# Patient Record
Sex: Male | Born: 1990 | Race: White | Hispanic: No | Marital: Single | Smoking: Current every day smoker
Health system: Southern US, Community
[De-identification: ages and names within clinical notes are randomized; demographics above are authoritative.]

## PROBLEM LIST (undated history)

## (undated) DIAGNOSIS — F319 Bipolar disorder, unspecified: Secondary | ICD-10-CM

## (undated) DIAGNOSIS — Z22322 Carrier or suspected carrier of Methicillin resistant Staphylococcus aureus: Secondary | ICD-10-CM

## (undated) DIAGNOSIS — F1920 Other psychoactive substance dependence, uncomplicated: Secondary | ICD-10-CM

## (undated) DIAGNOSIS — J45909 Unspecified asthma, uncomplicated: Secondary | ICD-10-CM

---
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
## 2012-10-14 ENCOUNTER — Emergency Department (HOSPITAL_COMMUNITY)
Admission: EM | Admit: 2012-10-14 | Discharge: 2012-10-14 | Disposition: A | Discharge status: Home / Self Care | Payer: Self-pay | Attending: Emergency Medicine | Admitting: Emergency Medicine

## 2012-10-14 ENCOUNTER — Encounter (HOSPITAL_COMMUNITY): Payer: Self-pay | Admitting: Emergency Medicine

## 2012-10-14 DIAGNOSIS — J45901 Unspecified asthma with (acute) exacerbation: Secondary | ICD-10-CM | POA: Insufficient documentation

## 2012-10-14 DIAGNOSIS — K047 Periapical abscess without sinus: Secondary | ICD-10-CM | POA: Insufficient documentation

## 2012-10-14 HISTORY — DX: Unspecified asthma, uncomplicated: J45.909

## 2012-10-14 MED ORDER — ACETAMINOPHEN 325 MG PO TABS
650.0000 mg | ORAL_TABLET | Freq: Once | ORAL | Status: AC
  Administered 2012-10-14: 650 mg via ORAL
  Filled 2012-10-14: qty 2

## 2012-10-14 MED ORDER — ALBUTEROL SULFATE HFA 108 (90 BASE) MCG/ACT IN AERS: 2.0000 | INHALATION_SPRAY | RESPIRATORY_TRACT | Status: DC | PRN

## 2012-10-14 MED ORDER — ALBUTEROL SULFATE (5 MG/ML) 0.5% IN NEBU
INHALATION_SOLUTION | RESPIRATORY_TRACT | Status: AC
  Filled 2012-10-14: qty 0.5

## 2012-10-14 MED ORDER — PREDNISONE 20 MG PO TABS
40.0000 mg | ORAL_TABLET | Freq: Once | ORAL | Status: AC
  Administered 2012-10-14: 40 mg via ORAL
  Filled 2012-10-14: qty 2

## 2012-10-14 MED ORDER — ALBUTEROL SULFATE (5 MG/ML) 0.5% IN NEBU
5.0000 mg | INHALATION_SOLUTION | Freq: Once | RESPIRATORY_TRACT | Status: AC
  Administered 2012-10-14: 5 mg via RESPIRATORY_TRACT
  Filled 2012-10-14: qty 1

## 2012-10-14 MED ORDER — PREDNISONE 20 MG PO TABS: ORAL_TABLET | ORAL | Status: DC

## 2012-10-14 MED ORDER — PENICILLIN V POTASSIUM 500 MG PO TABS: 500.0000 mg | ORAL_TABLET | Freq: Three times a day (TID) | ORAL | Status: DC

## 2012-10-14 MED ORDER — ALBUTEROL SULFATE (5 MG/ML) 0.5% IN NEBU
2.5000 mg | INHALATION_SOLUTION | Freq: Once | RESPIRATORY_TRACT | Status: AC
  Administered 2012-10-14: 2.5 mg via RESPIRATORY_TRACT
  Filled 2012-10-14: qty 0.5

## 2012-10-14 NOTE — ED Notes (Signed)
Patient with c/o SOB and asthma exacerbation Patient states that he recently lost his Albuterol inhaler at work Patient able to speak in full, complete sentences without difficulty--no SOB, DOE noted during assessment Faint exp. Wheezing noted bilaterally Patient in NAD Neb tx given, see MAR Call bell in reach

## 2012-10-14 NOTE — ED Provider Notes (Signed)
CSN: [id number]     Arrival date & time 10/14/12  0137 History   First MD Initiated Contact with Patient 10/14/12 0154     Chief Complaint  Patient presents with  . Asthma   (Consider location/radiation/quality/duration/timing/severity/associated sxs/prior Treatment) HPI Comments: 22 yo male with asthma hx, no pcp, non smoker, asthma hx presents with gradual worsening breathing difficulty for three days, similar to previous asthma. Pt out of albuterol.  No current abx or prednisone.  Pt also has left lower tooth abscess, no fevers, pain with palpation of gingiva.   Patient is a 22 y.o. male presenting with asthma. The history is provided by the patient.  Asthma Associated symptoms include shortness of breath. Pertinent negatives include no chest pain and no abdominal pain.    Past Medical History  Diagnosis Date  . Asthma    History reviewed. No pertinent past surgical history. No family history on file. History  Substance Use Topics  . Smoking status: Never Smoker   . Smokeless tobacco: Not on file  . Alcohol Use: No    Review of Systems  Constitutional: Negative for fever and chills.  HENT: Positive for dental problem. Negative for neck pain and neck stiffness.   Respiratory: Positive for shortness of breath.   Cardiovascular: Negative for chest pain.  Gastrointestinal: Negative for vomiting and abdominal pain.  Genitourinary: Negative for dysuria and flank pain.  Musculoskeletal: Negative for back pain.  Skin: Negative for rash.    Allergies  Review of patient's allergies indicates no known allergies.  Home Medications   Current Outpatient Rx  Name  Route  Sig  Dispense  Refill  . albuterol (PROVENTIL HFA;VENTOLIN HFA) 108 (90 BASE) MCG/ACT inhaler   Inhalation   Inhale 2 puffs into the lungs every 6 (six) hours as needed for wheezing.          BP 145/74  Pulse 72  Temp(Src) 97.7 F (36.5 C) (Oral)  Resp 20  Ht 6\' 2"  (1.88 m)  Wt 285 lb (129.275 kg)  BMI  36.58 kg/m2  SpO2 97% Physical Exam  Nursing note and vitals reviewed. Constitutional: He is oriented to person, place, and time. He appears well-developed and well-nourished.  HENT:  Head: Normocephalic and atraumatic.  Marland Kitchen5 cm lateral gingival abscess left lower posterior molar No trismus, uvular deviation, unilateral posterior pharyngeal edema or submandibular swelling   Eyes: Conjunctivae are normal. Right eye exhibits no discharge. Left eye exhibits no discharge.  Neck: Normal range of motion. Neck supple. No tracheal deviation present.  Cardiovascular: Normal rate and regular rhythm.   Pulmonary/Chest: He has wheezes (expiratory ).  Abdominal: Soft. He exhibits no distension. There is no tenderness. There is no guarding.  Musculoskeletal: He exhibits no edema.  Neurological: He is alert and oriented to person, place, and time.  Skin: Skin is warm. No rash noted.  Psychiatric: He has a normal mood and affect.    ED Course  Procedures (including critical care time) Labs Review Labs Reviewed - No data to display Imaging Review No results found.  MDM  No diagnosis found.  INCISION AND DRAINAGE Performed by: Enid Skeens Consent: Verbal consent obtained. Risks and benefits: risks, benefits and alternatives were discussed Type: abscess  Body area: left lower gingiva Anesthesia: local infiltration Incision was made with a scalpel. Local anesthetic: lidocaine  Complexity: simple Blunt dissection to break up loculations Drainage: blood/ pus 2 cc  Patient tolerance: Patient tolerated the procedure well with no immediate complications.  Pt received  nebs in ED, improved significantly on recheck, good air movement, not requiring O2. Steroids in ED.    Asthma exacerbation, Dental abscess  Enid Skeens, MD 10/14/12 [phone]

## 2012-10-14 NOTE — ED Notes (Signed)
MD at bedside. 

## 2012-10-14 NOTE — ED Notes (Signed)
Pt c/o shob, pt does not have MDI, denies fever.

## 2012-11-04 ENCOUNTER — Emergency Department (HOSPITAL_COMMUNITY)
Admission: EM | Admit: 2012-11-04 | Discharge: 2012-11-04 | Disposition: A | Discharge status: Home / Self Care | Payer: Self-pay | Attending: Emergency Medicine | Admitting: Emergency Medicine

## 2012-11-04 ENCOUNTER — Encounter (HOSPITAL_COMMUNITY): Payer: Self-pay | Admitting: Emergency Medicine

## 2012-11-04 DIAGNOSIS — R002 Palpitations: Secondary | ICD-10-CM | POA: Insufficient documentation

## 2012-11-04 DIAGNOSIS — J45901 Unspecified asthma with (acute) exacerbation: Secondary | ICD-10-CM | POA: Insufficient documentation

## 2012-11-04 DIAGNOSIS — Z792 Long term (current) use of antibiotics: Secondary | ICD-10-CM | POA: Insufficient documentation

## 2012-11-04 DIAGNOSIS — J4521 Mild intermittent asthma with (acute) exacerbation: Secondary | ICD-10-CM

## 2012-11-04 MED ORDER — ALBUTEROL SULFATE HFA 108 (90 BASE) MCG/ACT IN AERS
2.0000 | INHALATION_SPRAY | RESPIRATORY_TRACT | Status: DC | PRN
  Filled 2012-11-04: qty 6.7

## 2012-11-04 MED ORDER — ALBUTEROL SULFATE (5 MG/ML) 0.5% IN NEBU
5.0000 mg | INHALATION_SOLUTION | Freq: Once | RESPIRATORY_TRACT | Status: AC
  Administered 2012-11-04: 5 mg via RESPIRATORY_TRACT
  Filled 2012-11-04: qty 1

## 2012-11-04 NOTE — ED Notes (Signed)
Denies shortness of breath on discharge.

## 2012-11-04 NOTE — ED Provider Notes (Signed)
CSN: [id number]     Arrival date & time 11/04/12  2120 History   First MD Initiated Contact with Patient 11/04/12 2233     Chief Complaint  Patient presents with  . Shortness of Breath   (Consider location/radiation/quality/duration/timing/severity/associated sxs/prior Treatment) HPI Comments: No PCP has had increasing episodes of wheezing over the past several weeks with no fever or URI symptoms Out of inhaler   Patient is a 22 y.o. male presenting with shortness of breath. The history is provided by the patient.  Shortness of Breath Severity:  Moderate Onset quality:  Gradual Duration:  2 weeks Timing:  Intermittent Chronicity:  Recurrent Relieved by:  Inhaler (out of inhaler for the past 3 weeks ) Associated symptoms: cough and wheezing   Associated symptoms: no chest pain and no fever     Past Medical History  Diagnosis Date  . Asthma    History reviewed. No pertinent past surgical history. No family history on file. History  Substance Use Topics  . Smoking status: Never Smoker   . Smokeless tobacco: Not on file  . Alcohol Use: No    Review of Systems  Constitutional: Negative for fever.  HENT: Negative for rhinorrhea.   Respiratory: Positive for cough, shortness of breath and wheezing.   Cardiovascular: Positive for palpitations. Negative for chest pain.  All other systems reviewed and are negative.    Allergies  Review of patient's allergies indicates no known allergies.  Home Medications   Current Outpatient Rx  Name  Route  Sig  Dispense  Refill  . albuterol (PROVENTIL HFA;VENTOLIN HFA) 108 (90 BASE) MCG/ACT inhaler   Inhalation   Inhale 2 puffs into the lungs every 6 (six) hours as needed for wheezing.         . penicillin v potassium (VEETID) 500 MG tablet   Oral   Take 1 tablet (500 mg total) by mouth 3 (three) times daily.   21 tablet   0    BP 127/71  Pulse 72  Temp(Src) 97.9 F (36.6 C) (Oral)  Resp 18  Wt 290 lb 3.2 oz (131.634 kg)   BMI 37.24 kg/m2  SpO2 97% Physical Exam  Nursing note and vitals reviewed. Constitutional: He appears well-developed and well-nourished.  HENT:  Head: Normocephalic.  Neck: Normal range of motion.  Cardiovascular: Regular rhythm.   Pulmonary/Chest: Effort normal. He has wheezes.  Slight expiratory wheeze on R   Seen after neb treatment   Musculoskeletal: Normal range of motion.  Neurological: He is alert.    ED Course  Procedures (including critical care time) Labs Review Labs Reviewed - No data to display Imaging Review No results found.  EKG Interpretation   None       MDM   1. Asthma exacerbation attacks, mild intermittent     Will give inhaler and resource list     Arman Filter, NP 11/04/12 2248

## 2012-11-04 NOTE — ED Notes (Signed)
Patient with increasing shortness of breath since this morning.  Patient states that he caught a cold and has made his cough and breathing worse.

## 2012-11-04 NOTE — ED Provider Notes (Signed)
Medical screening examination/treatment/procedure(s) were performed by non-physician practitioner and as supervising physician I was immediately available for consultation/collaboration.  EKG Interpretation   None         Gwyneth Sprout, MD 11/04/12 2351

## 2013-07-11 ENCOUNTER — Ambulatory Visit (HOSPITAL_COMMUNITY)
Admission: RE | Admit: 2013-07-11 | Discharge: 2013-07-11 | Disposition: A | Discharge status: Home / Self Care | Payer: Self-pay | Attending: Psychiatry | Admitting: Psychiatry

## 2013-07-11 NOTE — BH Assessment (Signed)
Assessment Note  Logan Hancock is an 23 y.[place]. male presenting as walk in and accompanied by his mother. 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 He reports first use at age 23. He uses IV nearly daily. Longest period of abstinence has been 1 month. He reports cutting down usage recently by taking Suboxone 1/2 a strip from a friend.  He reports no cocaine/crack usage. No use of pain pills. Reports rare alcohol usage. Marijuana use 1 joint nearly daily and began usage at age 23.  Last use of heroin yesterday, injects into hands, visible track marks. 1/2 gram usage yesterday. Reports withdrawal symptoms of nausea, irritability, diarrhea in past. No history of substance abuse or mental health inpatient or outpatient treatment. Reports no depression or symptoms of anxiety. Denied SI/HI and AH/VH.  Trouble sleeping and poor appetite recently. Currently homeless and staying with friends after mother kicked him out for stealing. Currently unemployed and has 9th grade education. Reports no current or past legal problems. Reports losing his housing is his motivation to change. He declines [facility] to [facility] or [facility] for inpatient [facility] detox. [facility] as we do not provide heroin detox per Brett Albinoori, NP. Provided outpatient methadone resources, outpatient substance abuse treatment, and therapeutic substance abuse community resources. Encouraged consideration of therapeutic community which could provide housing, treatment, and educational resources.   Axis I: Substance Abuse (Cannabis, Heroin) Axis II: Deferred Axis III:  Past Medical History  Diagnosis Date  . Asthma    Axis IV: economic problems, educational problems, housing problems and occupational problems Axis V: 51-60 moderate symptoms  Past Medical History:  Past Medical History  Diagnosis Date  . Asthma     No past surgical history on file.  Family History: No family history on file.  Social History:  reports that he has never smoked. He  does not have any smokeless tobacco history on file. He reports that he uses illicit drugs (Marijuana). He reports that he does not drink alcohol.  Additional Social History:     CIWA:   COWS:    Allergies: No Known Allergies  Home Medications:  (Not in a hospital admission)  OB/GYN Status:  No LMP for male patient.  General Assessment Data Location of Assessment: [facility] Services Is this a Tele or Face-to-Face Assessment?: Face-to-Face Is this an Initial Assessment or a Re-assessment for this encounter?: Initial Assessment Living Arrangements: Non-relatives/Friends Can pt return to current living arrangement?: Yes Admission Status: Voluntary Is patient capable of signing voluntary admission?: Yes Transfer from: Home Referral Source: Self/Family/Friend  Medical Screening Exam [facility] Walk-in ONLY) Medical Exam completed: No Reason for MSE not completed:  (declined)  [facility] Crisis Care Plan Living Arrangements: Non-relatives/Friends  Education Status Is patient currently in school?: No Highest grade of school patient has completed: 9th Name of school: [employer] Contact person: mother  Risk to self Suicidal Ideation: No Suicidal Intent: No Is patient at risk for suicide?: No Suicidal Plan?: No Access to Means: No What has been your use of drugs/alcohol within the last 12 months?:  (see above) Previous Attempts/Gestures: No Other Self Harm Risks: none Triggers for Past Attempts: None known Intentional Self Injurious Behavior: None Family Suicide History: No Recent stressful life event(s): Job Loss;Financial Problems Depression: No Depression Symptoms: Feeling angry/irritable Substance abuse history and/or treatment for substance abuse?: Yes Suicide prevention information given to non-admitted patients: Yes  Risk to Others Homicidal Ideation: No Thoughts of Harm to Others: No Current Homicidal Intent: No Current Homicidal Plan:  No Access to Homicidal Means:  No History of harm to others?: No Assessment of Violence: None Noted Does patient have access to weapons?: No Criminal Charges Pending?: No Does patient have a court date: No  Psychosis Hallucinations: None noted Delusions: None noted  Mental Status Report Appear/Hygiene: Unremarkable Eye Contact: Fair Motor Activity: Unremarkable Speech: Unremarkable Level of Consciousness: Alert Mood: Depressed;Anxious Affect: Apathetic Anxiety Level: Moderate Thought Processes: Coherent Judgement: Unimpaired Orientation: Person;Place;Time;Situation Obsessive Compulsive Thoughts/Behaviors: None  Cognitive Functioning Concentration: Decreased Memory: Unable to Assess IQ: Average Insight: Fair Impulse Control: Fair Appetite: Poor Weight Loss:  (50 pounds in last year) Weight Gain: 0 Sleep: Decreased Total Hours of Sleep: 3 Vegetative Symptoms: None  ADLScreening [facility] Assessment Services) Patient's cognitive ability adequate to safely complete daily activities?: Yes Patient able to express need for assistance with ADLs?: Yes Independently performs ADLs?: Yes (appropriate for developmental age)  Prior Inpatient Therapy Prior Inpatient Therapy: No  Prior Outpatient Therapy Prior Outpatient Therapy: No  ADL Screening (condition at time of admission) Patient's cognitive ability adequate to safely complete daily activities?: Yes Patient able to express need for assistance with ADLs?: Yes Independently performs ADLs?: Yes (appropriate for developmental age)                  Additional Information 1:1 In Past 12 Months?: No CIRT Risk: No Elopement Risk: No Does patient have medical clearance?: No     Disposition:  Disposition Initial Assessment Completed for this Encounter: Yes Disposition of Patient: Outpatient treatment Type of outpatient treatment: Chemical Dependence - Intensive Outpatient  On Site Evaluation by:  Barrett HenleAmanda Gioia Ranes, LCSW Reviewed with Physician:   Janann Augustori Burkett, NP  Logan Hancock 07/11/2013 2:06 PM

## 2013-07-21 ENCOUNTER — Encounter (HOSPITAL_COMMUNITY): Payer: Self-pay | Admitting: Emergency Medicine

## 2013-07-21 ENCOUNTER — Emergency Department (HOSPITAL_COMMUNITY): Payer: Self-pay

## 2013-07-21 ENCOUNTER — Emergency Department (HOSPITAL_COMMUNITY)
Admission: EM | Admit: 2013-07-21 | Discharge: 2013-07-21 | Disposition: A | Discharge status: Home / Self Care | Payer: Self-pay | Attending: Emergency Medicine | Admitting: Emergency Medicine

## 2013-07-21 DIAGNOSIS — J45901 Unspecified asthma with (acute) exacerbation: Secondary | ICD-10-CM | POA: Insufficient documentation

## 2013-07-21 DIAGNOSIS — J4520 Mild intermittent asthma, uncomplicated: Secondary | ICD-10-CM

## 2013-07-21 MED ORDER — ALBUTEROL SULFATE HFA 108 (90 BASE) MCG/ACT IN AERS
2.0000 | INHALATION_SPRAY | RESPIRATORY_TRACT | Status: DC | PRN
  Administered 2013-07-21: 2 via RESPIRATORY_TRACT
  Filled 2013-07-21: qty 6.7

## 2013-07-21 NOTE — Discharge Instructions (Signed)

## 2013-07-21 NOTE — ED Provider Notes (Signed)
CSN: [id number]     Arrival date & time 07/21/13  0221 History   First MD Initiated Contact with Patient 07/21/13 0515     Chief Complaint  Patient presents with  . Asthma   HPI  History provided by the patient. Patient is a 23 year old male with history of asthma presenting with worsened asthma symptoms for the past few months. This was especially worse over the past few days. He reports that he has been without any asthma inhaler or other treatments due to lack of insurance. [place] months. He denies having any new cough. No congestion or rhinorrhea. No fever, chills or sweats. he reports a tightness and shortness of breath that is usually worse with activity. Has also noticed worse symptoms at night when sleeping. He is requesting help with treatment for his asthma. No other aggravating or alleviating factors. No other associated symptoms.    Past Medical History  Diagnosis Date  . Asthma    History reviewed. No pertinent past surgical history. No family history on file. History  Substance Use Topics  . Smoking status: Never Smoker   . Smokeless tobacco: Not on file  . Alcohol Use: No    Review of Systems  Constitutional: Negative for fever and chills.  HENT: Negative for congestion.   Respiratory: Positive for shortness of breath and wheezing. Negative for cough.   All other systems reviewed and are negative.     Allergies  Review of patient's allergies indicates no known allergies.  Home Medications   Prior to Admission medications   Not on File   BP 134/117  Pulse 90  Temp(Src) 97.7 F (36.5 C) (Oral)  Resp 18  Ht 6\' 3"  (1.905 m)  Wt 264 lb (119.75 kg)  BMI 33.00 kg/m2  SpO2 98% Physical Exam  Nursing note and vitals reviewed. Constitutional: He is oriented to person, place, and time. He appears well-developed and well-nourished.  HENT:  Head: Normocephalic.   Cardiovascular: Normal rate and regular rhythm.   Pulmonary/Chest: Effort normal. He has wheezes. He has no rales. He exhibits no tenderness.  Abdominal: Soft.  Neurological: He is alert and oriented to person, place, and time.  Skin: Skin is warm.  Psychiatric: He has a normal mood and affect. His behavior is normal.      ED Course  Procedures   COORDINATION OF CARE:  Nursing notes reviewed. Vital signs reviewed. Initial pt interview and examination performed.   Filed Vitals:   07/21/13 0306  BP: 134/117  Pulse: 90  Temp: 97.7 F (36.5 C)  TempSrc: Oral  Resp: 18  Height: 6\' 3"  (1.905 m)  Weight: 264 lb (119.75 kg)  SpO2: 98%    5:21 AM-patient seen and evaluated. He is well appearing. Normal respirations and O2 sats. Does not appear in any respiratory distress. He is afebrile without any history of significant cough. Doubt any pneumonia. He has some very slight expiratory wheezing on exam. He is requesting an albuterol inhaler. At this time due to his lack of insurance we'll provide him an inhaler to take home. Be given referrals for primary care provider. He agrees to try to followup.       MDM   Final diagnoses:  Asthma, mild intermittent, uncomplicated       Angus Sellereter S Shawnelle Spoerl, PA-C 07/21/13 2301

## 2013-07-21 NOTE — ED Notes (Signed)
Pt refused chest x-ray

## 2013-07-21 NOTE — ED Notes (Signed)
Pt presents with c/o difficulty breathing x 2 months, worse last 2 days. Pt states he does not have funds to get inhaler filled.

## 2013-07-22 NOTE — ED Provider Notes (Signed)
Medical screening examination/treatment/procedure(s) were performed by non-physician practitioner and as supervising physician I was immediately available for consultation/collaboration.   EKG Interpretation None       Dyllon Henken M Angi Goodell, MD 07/22/13 1630 

## 2014-01-22 ENCOUNTER — Emergency Department (HOSPITAL_COMMUNITY): Payer: Self-pay

## 2014-01-22 ENCOUNTER — Encounter (HOSPITAL_COMMUNITY): Payer: Self-pay | Admitting: Emergency Medicine

## 2014-01-22 ENCOUNTER — Emergency Department (HOSPITAL_COMMUNITY)
Admission: EM | Admit: 2014-01-22 | Discharge: 2014-01-22 | Disposition: A | Discharge status: Home / Self Care | Payer: Self-pay | Attending: Emergency Medicine | Admitting: Emergency Medicine

## 2014-01-22 DIAGNOSIS — J45909 Unspecified asthma, uncomplicated: Secondary | ICD-10-CM

## 2014-01-22 DIAGNOSIS — J45901 Unspecified asthma with (acute) exacerbation: Secondary | ICD-10-CM | POA: Insufficient documentation

## 2014-01-22 MED ORDER — ALBUTEROL SULFATE HFA 108 (90 BASE) MCG/ACT IN AERS
2.0000 | INHALATION_SPRAY | RESPIRATORY_TRACT | Status: DC | PRN
  Administered 2014-01-22: 2 via RESPIRATORY_TRACT
  Filled 2014-01-22: qty 6.7

## 2014-01-22 MED ORDER — PREDNISONE 20 MG PO TABS
60.0000 mg | ORAL_TABLET | Freq: Once | ORAL | Status: AC
  Administered 2014-01-22: 60 mg via ORAL
  Filled 2014-01-22: qty 3

## 2014-01-22 MED ORDER — PREDNISONE 20 MG PO TABS: 60.0000 mg | ORAL_TABLET | Freq: Every day | ORAL | Status: DC

## 2014-01-22 NOTE — ED Provider Notes (Signed)
CSN: [id number]     Arrival date & time 01/22/14  1910 History   First MD Initiated Contact with Patient 01/22/14 1957     Chief Complaint  Patient presents with  . Asthma     (Consider location/radiation/quality/duration/timing/severity/associated sxs/prior Treatment) Patient is a 24 y.o. male presenting with asthma. The history is provided by the patient. No language interpreter was used.  Asthma This is a recurrent problem. The current episode started in the past 7 days. Associated symptoms include chest pain, congestion and coughing. Pertinent negatives include no abdominal pain, chills, fever, nausea or vomiting. Associated symptoms comments: Increased wheezing in the last 2-3 days. He reports afebrile URI symptoms of congestion and cough last week that was improving. He feels his wheezing started after the heat was turned on in the home and it seems to affect him at night. No fever. He complains of right sided chest wall pain, worse with movement and cough. No abdominal pain, N, V..    Past Medical History  Diagnosis Date  . Asthma    History reviewed. No pertinent past surgical history. No family history on file. History  Substance Use Topics  . Smoking status: Never Smoker   . Smokeless tobacco: Not on file  . Alcohol Use: Yes     Comment: occasional    Review of Systems  Constitutional: Negative for fever and chills.  HENT: Positive for congestion.   Respiratory: Positive for cough and wheezing.   Cardiovascular: Positive for chest pain.  Gastrointestinal: Negative.  Negative for nausea, vomiting and abdominal pain.  Musculoskeletal: Negative.   Skin: Negative.   Neurological: Negative.       Allergies  Review of patient's allergies indicates no known allergies.  Home Medications   Prior to Admission medications   Not on File   BP 156/63 mmHg  Pulse 76  Temp(Src) 98 F (36.7 C) (Oral)  Resp 14  SpO2 99% Physical Exam  Constitutional: He is oriented to  person, place, and time. He appears well-developed and well-nourished.  HENT:  Head: Normocephalic.  Neck: Normal range of motion. Neck supple.  Cardiovascular: Normal rate and regular rhythm.   Pulmonary/Chest: Effort normal and breath sounds normal. He has no wheezes. He has no rales. He exhibits tenderness.  No wheezing on exam after HHN with albuterol x 2. Reproducible chest wall tenderness right lateral wall.   Abdominal: Soft. Bowel sounds are normal. There is no tenderness. There is no rebound and no guarding.  Musculoskeletal: Normal range of motion.  Neurological: He is alert and oriented to person, place, and time.  Skin: Skin is warm and dry. No rash noted.  Psychiatric: He has a normal mood and affect.    ED Course  Procedures (including critical care time) Labs Review Labs Reviewed - No data to display  Imaging Review Dg Chest 2 View  01/22/2014   CLINICAL DATA:  Asthma, shortness of breath, no chest pain  EXAM: CHEST  2 VIEW  COMPARISON:  None.  FINDINGS: The heart size and mediastinal contours are within normal limits. Both lungs are clear. The visualized skeletal structures are unremarkable.  IMPRESSION: No active cardiopulmonary disease.   Electronically Signed   By: Elige KoHetal  Patel   On: 01/22/2014 20:21     EKG Interpretation   Date/Time:  Wednesday January 22 2014 19:22:35 EST Ventricular Rate:  83 PR Interval:  147 QRS Duration: 114 QT Interval:  349 QTC Calculation: 410 R Axis:   53 Text Interpretation:  Sinus rhythm  Incomplete right bundle branch block No  old tracing to compare Confirmed by [facility]  MD, DAVID [phone]) on 01/22/2014  7:25:01 PM      MDM   Final diagnoses:  Asthma    He is improved and reports he feels subjectively much better. Feel chest tenderness follows muscular pattern. Will treat with prednisone, provide inhaler and resources for outpatient follow up.    Arnoldo Hooker, PA-C 01/22/14 2046  Dione Booze, MD 01/23/14 [phone]

## 2014-01-22 NOTE — Discharge Instructions (Signed)
Asthma Asthma is a recurring condition in which the airways tighten and narrow. Asthma can make it difficult to breathe. It can cause coughing, wheezing, and shortness of breath. Asthma episodes, also called asthma attacks, range from minor to life-threatening. Asthma cannot be cured, but medicines and lifestyle changes can help control it. CAUSES Asthma is believed to be caused by inherited (genetic) and environmental factors, but its exact cause is unknown. Asthma may be triggered by allergens, lung infections, or irritants in the air. Asthma triggers are different for each person. Common triggers include:   Animal dander.  Dust mites.  Cockroaches.  Pollen from trees or grass.  Mold.  Smoke.  Air pollutants such as dust, household cleaners, hair sprays, aerosol sprays, paint fumes, strong chemicals, or strong odors.  Cold air, weather changes, and winds (which increase molds and pollens in the air).  Strong emotional expressions such as crying or laughing hard.  Stress.  Certain medicines (such as aspirin) or types of drugs (such as beta-blockers).  Sulfites in foods and drinks. Foods and drinks that may contain sulfites include dried fruit, potato chips, and sparkling grape juice.  Infections or inflammatory conditions such as the flu, a cold, or an inflammation of the nasal membranes (rhinitis).  Gastroesophageal reflux disease (GERD).  Exercise or strenuous activity. SYMPTOMS Symptoms may occur immediately after asthma is triggered or many hours later. Symptoms include:  Wheezing.  Excessive nighttime or early morning coughing.  Frequent or severe coughing with a common cold.  Chest tightness.  Shortness of breath. DIAGNOSIS  The diagnosis of asthma is made by a review of your medical history and a physical exam. Tests may also be performed. These may include:  Lung function studies. These tests show how much air you breathe in and out.  Allergy  tests.  Imaging tests such as X-rays. TREATMENT  Asthma cannot be cured, but it can usually be controlled. Treatment involves identifying and avoiding your asthma triggers. It also involves medicines. There are 2 classes of medicine used for asthma treatment:   Controller medicines. These prevent asthma symptoms from occurring. They are usually taken every day.  Reliever or rescue medicines. These quickly relieve asthma symptoms. They are used as needed and provide short-term relief. Your health care provider will help you create an asthma action plan. An asthma action plan is a written plan for managing and treating your asthma attacks. It includes a list of your asthma triggers and how they may be avoided. It also includes information on when medicines should be taken and when their dosage should be changed. An action plan may also involve the use of a device called a peak flow meter. A peak flow meter measures how well the lungs are working. It helps you monitor your condition. HOME CARE INSTRUCTIONS   Take medicines only as directed by your health care provider. Speak with your health care provider if you have questions about how or when to take the medicines.  Use a peak flow meter as directed by your health care provider. Record and keep track of readings.  Understand and use the action plan to help minimize or stop an asthma attack without needing to seek medical care.  Control your home environment in the following ways to help prevent asthma attacks:  Do not smoke. Avoid being exposed to secondhand smoke.  Change your heating and air conditioning filter regularly.  Limit your use of fireplaces and wood stoves.  Get rid of pests (such as roaches and  mice) and their droppings.  Throw away plants if you see mold on them.  Clean your floors and dust regularly. Use unscented cleaning products.  Try to have someone else vacuum for you regularly. Stay out of rooms while they are  being vacuumed and for a short while afterward. If you vacuum, use a dust mask from a hardware store, a double-layered or microfilter vacuum cleaner bag, or a vacuum cleaner with a HEPA filter.  Replace carpet with wood, tile, or vinyl flooring. Carpet can trap dander and dust.  Use allergy-proof pillows, mattress covers, and box spring covers.  Wash bed sheets and blankets every week in hot water and dry them in a dryer.  Use blankets that are made of polyester or cotton.  Clean bathrooms and kitchens with bleach. If possible, have someone repaint the walls in these rooms with mold-resistant paint. Keep out of the rooms that are being cleaned and painted.  Wash hands frequently. SEEK MEDICAL CARE IF:   You have wheezing, shortness of breath, or a cough even if taking medicine to prevent attacks.  The colored mucus you cough up (sputum) is thicker than usual.  Your sputum changes from clear or white to yellow, green, gray, or bloody.  You have any problems that may be related to the medicines you are taking (such as a rash, itching, swelling, or trouble breathing).  You are using a reliever medicine more than 2-3 times per week.  Your peak flow is still at 50-79% of your personal best after following your action plan for 1 hour.  You have a fever. SEEK IMMEDIATE MEDICAL CARE IF:   You seem to be getting worse and are unresponsive to treatment during an asthma attack.  You are short of breath even at rest.  You get short of breath when doing very little physical activity.  You have difficulty eating, drinking, or talking due to asthma symptoms.  You develop chest pain.  You develop a fast heartbeat.  You have a bluish color to your lips or fingernails.  You are light-headed, dizzy, or faint.  Your peak flow is less than 50% of your personal best. MAKE SURE YOU:   Understand these instructions.  Will watch your condition.  Will get help right away if you are not  doing well or get worse. Document Released: 12/27/2004 Document Revised: 05/13/2013 Document Reviewed: 07/26/2012 [facility] Patient Information 2015 [place]. This information is not intended to replace advice given to you by your health care provider. Make sure you discuss any questions you have with your health care provider. Heat Therapy Heat therapy can help ease sore, stiff, injured, and tight muscles and joints. Heat relaxes your muscles, which may help ease your pain.  RISKS AND COMPLICATIONS If you have any of the following conditions, do not use heat therapy unless your health care provider has approved:  Poor circulation.  Healing wounds or scarred skin in the area being treated.  Diabetes, heart disease, or high blood pressure.  Not being able to feel (numbness) the area being treated.  Unusual swelling of the area being treated.  Active infections.  Blood clots.  Cancer.  Inability to communicate pain. This may include young children and people who have problems with their brain function (dementia).  Pregnancy. Heat therapy should only be used on old, pre-existing, or long-lasting (chronic) injuries. Do not use heat therapy on new injuries unless directed by your health care provider. HOW TO USE HEAT THERAPY There are several different  kinds of heat therapy, including:  Moist heat pack.  Warm water bath.  Hot water bottle.  Electric heating pad.  Heated gel pack.  Heated wrap.  Electric heating pad. Use the heat therapy method suggested by your health care provider. Follow your health care provider's instructions on when and how to use heat therapy. GENERAL HEAT THERAPY RECOMMENDATIONS  Do not sleep while using heat therapy. Only use heat therapy while you are awake.  Your skin may turn pink while using heat therapy. Do not use heat therapy if your skin turns red.  Do not use heat therapy if you have new pain.  High heat or long exposure to heat  can cause burns. Be careful when using heat therapy to avoid burning your skin.  Do not use heat therapy on areas of your skin that are already irritated, such as with a rash or sunburn. SEEK MEDICAL CARE IF:  You have blisters, redness, swelling, or numbness.  You have new pain.  Your pain is worse. MAKE SURE YOU:  Understand these instructions.  Will watch your condition.  Will get help right away if you are not doing well or get worse. Document Released: 03/21/2011 Document Revised: 05/13/2013 Document Reviewed: 02/19/2013 [facility] Patient Information 2015 [place], [place]. This information is not intended to replace advice given to you by your health care provider. Make sure you discuss any questions you have with your health care provider. Chest Wall Pain Chest wall pain is pain in or around the bones and muscles of your chest. It may take up to 6 weeks to get better. It may take longer if you must stay physically active in your work and activities.  CAUSES  Chest wall pain may happen on its own. However, it may be caused by:  A viral illness like the flu.  Injury.  Coughing.  Exercise.  Arthritis.  Fibromyalgia.  Shingles. HOME CARE INSTRUCTIONS   Avoid overtiring physical activity. Try not to strain or perform activities that cause pain. This includes any activities using your chest or your abdominal and side muscles, especially if heavy weights are used.  Put ice on the sore area.  Put ice in a plastic bag.  Place a towel between your skin and the bag.  Leave the ice on for 15-20 minutes per hour while awake for the first 2 days.  Only take over-the-counter or prescription medicines for pain, discomfort, or fever as directed by your caregiver. SEEK IMMEDIATE MEDICAL CARE IF:   Your pain increases, or you are very uncomfortable.  You have a fever.  Your chest pain becomes worse.  You have new, unexplained symptoms.  You have nausea or vomiting.  You  feel sweaty or lightheaded.  You have a cough with phlegm (sputum), or you cough up blood. MAKE SURE YOU:   Understand these instructions.  Will watch your condition.  Will get help right away if you are not doing well or get worse. Document Released: 12/27/2004 Document Revised: 03/21/2011 Document Reviewed: 08/23/2010 [facility] Patient Information 2015 [place], [place]. This information is not intended to replace advice given to you by your health care provider. Make sure you discuss any questions you have with your health care provider.

## 2014-01-22 NOTE — ED Notes (Signed)
Removed IV, pt getting dressed.

## 2014-01-22 NOTE — ED Notes (Signed)
Pt transported to Xray. 

## 2014-01-22 NOTE — ED Notes (Signed)
PT monitored by pulse ox, bp cuff, and 12-lead. 

## 2014-01-22 NOTE — ED Notes (Signed)
EMS gave pt HHN's x's 2 and Solu Medrol 125mg  IV PTA

## 2014-01-22 NOTE — ED Notes (Signed)
Pt to ED via [facility] with c/o asthma.  Pt st's he has been having problems breathing x's 4 days but has been out of his inhaler x's 9 months.  St's has not had the money to get it

## 2015-08-21 ENCOUNTER — Encounter (HOSPITAL_COMMUNITY): Payer: Self-pay | Admitting: Emergency Medicine

## 2015-08-21 ENCOUNTER — Emergency Department (HOSPITAL_COMMUNITY)
Admission: EM | Admit: 2015-08-21 | Discharge: 2015-08-21 | Disposition: A | Discharge status: Home / Self Care | Payer: Self-pay | Attending: Emergency Medicine | Admitting: Emergency Medicine

## 2015-08-21 DIAGNOSIS — R112 Nausea with vomiting, unspecified: Secondary | ICD-10-CM | POA: Insufficient documentation

## 2015-08-21 DIAGNOSIS — K279 Peptic ulcer, site unspecified, unspecified as acute or chronic, without hemorrhage or perforation: Secondary | ICD-10-CM | POA: Insufficient documentation

## 2015-08-21 DIAGNOSIS — J45909 Unspecified asthma, uncomplicated: Secondary | ICD-10-CM | POA: Insufficient documentation

## 2015-08-21 LAB — COMPREHENSIVE METABOLIC PANEL
ALBUMIN: 4.1 g/dL (ref 3.5–5.0)
ALK PHOS: 66 U/L (ref 38–126)
ALT: 20 U/L (ref 17–63)
ANION GAP: 9 (ref 5–15)
AST: 36 U/L (ref 15–41)
BILIRUBIN TOTAL: 1.4 mg/dL — AB (ref 0.3–1.2)
BUN: 13 mg/dL (ref 6–20)
CALCIUM: 9.3 mg/dL (ref 8.9–10.3)
CO2: 26 mmol/L (ref 22–32)
Chloride: 104 mmol/L (ref 101–111)
Creatinine, Ser: 0.85 mg/dL (ref 0.61–1.24)
GFR calc Af Amer: >60 mL/min (ref >60)
GFR calc non Af Amer: >60 mL/min (ref >60)
Glucose, Bld: 87 mg/dL (ref 65–99)
Potassium: 4.3 mmol/L (ref 3.5–5.1)
Sodium: 139 mmol/L (ref 135–145)
TOTAL PROTEIN: 7.1 g/dL (ref 6.5–8.1)

## 2015-08-21 LAB — URINALYSIS, ROUTINE W REFLEX MICROSCOPIC
Bilirubin Urine: NEGATIVE
Glucose, UA: NEGATIVE mg/dL
HGB URINE DIPSTICK: NEGATIVE
Ketones, ur: NEGATIVE mg/dL
LEUKOCYTES UA: NEGATIVE
Nitrite: NEGATIVE
PROTEIN: NEGATIVE mg/dL
SPECIFIC GRAVITY, URINE: 1.025 (ref 1.005–1.030)
pH: 6 (ref 5.0–8.0)

## 2015-08-21 LAB — CBC
HCT: 46.9 % (ref 39.0–52.0)
HEMOGLOBIN: 14.9 g/dL (ref 13.0–17.0)
MCH: 26.7 pg (ref 26.0–34.0)
MCHC: 31.8 g/dL (ref 30.0–36.0)
MCV: 84.1 fL (ref 78.0–100.0)
PLATELETS: 202 10*3/uL (ref 150–400)
RBC: 5.58 MIL/uL (ref 4.22–5.81)
RDW: 14 % (ref 11.5–15.5)
WBC: 7.7 10*3/uL (ref 4.0–10.5)

## 2015-08-21 LAB — LIPASE, BLOOD: Lipase: 18 U/L (ref 11–51)

## 2015-08-21 MED ORDER — GI COCKTAIL ~~LOC~~
30.0000 mL | Freq: Once | ORAL | Status: AC
  Administered 2015-08-21: 30 mL via ORAL
  Filled 2015-08-21: qty 30

## 2015-08-21 MED ORDER — ACETAMINOPHEN 500 MG PO TABS
1000.0000 mg | ORAL_TABLET | Freq: Once | ORAL | Status: AC
Start: 2015-08-21 — End: 2015-08-21
  Administered 2015-08-21: 1000 mg via ORAL
  Filled 2015-08-21: qty 2

## 2015-08-21 MED ORDER — OMEPRAZOLE 20 MG PO CPDR: 20.0000 mg | DELAYED_RELEASE_CAPSULE | Freq: Every day | ORAL | 0 refills | Status: DC

## 2015-08-21 MED ORDER — SUCRALFATE 1 GM/10ML PO SUSP: 1.0000 g | Freq: Three times a day (TID) | ORAL | 0 refills | Status: DC

## 2015-08-21 NOTE — ED Notes (Signed)
Pt is heroine user. Difficult time with drawing blood.

## 2015-08-21 NOTE — ED Triage Notes (Signed)
Pt. Stated, I've had stomach pain for 2 weeks with N/V/D and today I have a migraine headache. Im homeless

## 2015-08-21 NOTE — ED Provider Notes (Signed)
[facility]EM[facility]Y DEPT Provider Note   CSN: [id number] Arrival date & time: 08/21/15  1153  First Provider Contact:  First MD Initiated Contact with Patient 08/21/15 1556        History   Chief Complaint Chief Complaint  Patient presents with  . Abdominal Pain  . Nausea  . Emesis  . Migraine    HPI Logan Hancock is a 25 y.o. male.  Patient presents to the emergency department with chief complaint of intermittent, moderate, upper abdominal pain. He states that he has had the pain for the past 2 weeks. He states that it is worse after he eats. He reports having occasional associated nausea and vomiting, but this is not persistent. He denies any fevers chills. He denies any other associated symptoms. There are no other modifying factors.   The history is provided by the patient. No language interpreter was used.    Past Medical History:  Diagnosis Date  . Asthma     There are no active problems to display for this patient.   History reviewed. No pertinent surgical history.     Home Medications    Prior to Admission medications   Medication Sig Start Date End Date Taking? Authorizing Provider  predniSONE (DELTASONE) 20 MG tablet Take 3 tablets (60 mg total) by mouth daily. 01/22/14   Elpidio AnisShari Upstill, PA-C    Family History No family history on file.  Social History Social History  Substance Use Topics  . Smoking status: Never Smoker  . Smokeless tobacco: Never Used  . Alcohol use Yes     Comment: occasional     Allergies   Review of patient's allergies indicates no known allergies.   Review of Systems Review of Systems  All other systems reviewed and are negative.    Physical Exam Updated Vital Signs BP 115/71 (BP Location: Right Arm)   Pulse 62   Temp 97.9 F (36.6 C) (Oral)   Resp 18   SpO2 100%   Physical Exam Physical Exam  Constitutional: Pt appears well-developed and well-nourished. No distress.  Awake, alert, nontoxic appearance  HENT:   Head: Normocephalic and atraumatic.  Mouth/Throat: Oropharynx is clear and moist. No oropharyngeal exudate.  Eyes: Conjunctivae are normal. No scleral icterus.  Neck: Normal range of motion. Neck supple.  Cardiovascular: Normal rate, regular rhythm and intact distal pulses.   Pulmonary/Chest: Effort normal and breath sounds normal. No respiratory distress. Pt has no wheezes.  Equal chest expansion  Abdominal: Soft. Bowel sounds are normal. No focal abdominal tenderness, no RLQ tenderness or pain at McBurney's point, no RUQ tenderness or Murphy's sign, no left-sided abdominal tenderness, no fluid wave, or signs of peritonitis Musculoskeletal: Normal range of motion. Pt exhibits no edema.  Neurological: Pt is alert.  Speech is clear and goal oriented Moves extremities without ataxia  Skin: Skin is warm and dry. Pt is not diaphoretic.  Psychiatric: Pt has a normal mood and affect.  Nursing note and vitals reviewed.    ED Treatments / Results  Labs (all labs ordered are listed, but only abnormal results are displayed) Labs Reviewed  COMPREHENSIVE METABOLIC PANEL - Abnormal; Notable for the following:       Result Value   Total Bilirubin 1.4 (*)    All other components within normal limits  LIPASE, BLOOD  CBC  URINALYSIS, ROUTINE W REFLEX MICROSCOPIC ([facility])    EKG  EKG Interpretation None       Radiology No results found.  Procedures Procedures (  including critical care time)  Medications Ordered in ED Medications  acetaminophen (TYLENOL) tablet 1,000 mg (not administered)  gi cocktail (Maalox,Lidocaine,Donnatal) (not administered)     Initial Impression / Assessment and Plan / ED Course  I have reviewed the triage vital signs and the nursing notes.  Pertinent labs & imaging results that were available during my care of the patient were reviewed by me and considered in my medical decision making (see chart for details).  Clinical Course   Patient with  upper left abdominal pain 2 weeks. Worsened after eating.  CBC, CMP, and lipase are unremarkable. Suspect that is the symptoms worsen after eating, and is localized to his left upper quadrant, that he may have peptic ulcer disease. I will treat him with omeprazole and Carafate. Recommend primary care follow-up. Will give resources for [facility].  Final Clinical Impressions(s) / ED Diagnoses   Final diagnoses:  Peptic ulcer    New Prescriptions New Prescriptions   No medications on file     Roxy Horseman, PA-C 08/21/15 1622    Marily Memos, MD 08/22/15 0028

## 2015-08-21 NOTE — ED Notes (Signed)
Called pt and asked him for a urine sample.  Pt is alert and oriented, ambulatory, states that he continues to feel unwell

## 2016-10-13 ENCOUNTER — Emergency Department (HOSPITAL_COMMUNITY)
Admission: EM | Admit: 2016-10-13 | Discharge: 2016-10-14 | Disposition: A | Discharge status: Home / Self Care | Payer: Self-pay | Attending: Physician Assistant | Admitting: Physician Assistant

## 2016-10-13 ENCOUNTER — Encounter (HOSPITAL_COMMUNITY): Payer: Self-pay | Admitting: Emergency Medicine

## 2016-10-13 DIAGNOSIS — F1910 Other psychoactive substance abuse, uncomplicated: Secondary | ICD-10-CM

## 2016-10-13 DIAGNOSIS — Z79899 Other long term (current) drug therapy: Secondary | ICD-10-CM | POA: Insufficient documentation

## 2016-10-13 DIAGNOSIS — R45851 Suicidal ideations: Secondary | ICD-10-CM | POA: Insufficient documentation

## 2016-10-13 DIAGNOSIS — Z008 Encounter for other general examination: Secondary | ICD-10-CM | POA: Insufficient documentation

## 2016-10-13 DIAGNOSIS — F17210 Nicotine dependence, cigarettes, uncomplicated: Secondary | ICD-10-CM | POA: Insufficient documentation

## 2016-10-13 DIAGNOSIS — F1994 Other psychoactive substance use, unspecified with psychoactive substance-induced mood disorder: Secondary | ICD-10-CM

## 2016-10-13 DIAGNOSIS — J45909 Unspecified asthma, uncomplicated: Secondary | ICD-10-CM | POA: Insufficient documentation

## 2016-10-13 DIAGNOSIS — F1120 Opioid dependence, uncomplicated: Secondary | ICD-10-CM | POA: Insufficient documentation

## 2016-10-13 LAB — RAPID URINE DRUG SCREEN, HOSP PERFORMED
Amphetamines: POSITIVE — AB
BENZODIAZEPINES: NOT DETECTED
Barbiturates: NOT DETECTED
Cocaine: POSITIVE — AB
OPIATES: POSITIVE — AB
Tetrahydrocannabinol: POSITIVE — AB

## 2016-10-13 LAB — COMPREHENSIVE METABOLIC PANEL
ALK PHOS: 163 U/L — AB (ref 38–126)
ALT: 419 U/L — AB (ref 17–63)
AST: 188 U/L — ABNORMAL HIGH (ref 15–41)
Albumin: 3.9 g/dL (ref 3.5–5.0)
Anion gap: 13 (ref 5–15)
BUN: 12 mg/dL (ref 6–20)
CO2: 23 mmol/L (ref 22–32)
CREATININE: 1 mg/dL (ref 0.61–1.24)
Calcium: 8.8 mg/dL — ABNORMAL LOW (ref 8.9–10.3)
Chloride: 99 mmol/L — ABNORMAL LOW (ref 101–111)
GFR calc Af Amer: >60 mL/min (ref >60)
Glucose, Bld: 109 mg/dL — ABNORMAL HIGH (ref 65–99)
Potassium: 3.7 mmol/L (ref 3.5–5.1)
Sodium: 135 mmol/L (ref 135–145)
Total Bilirubin: 1.8 mg/dL — ABNORMAL HIGH (ref 0.3–1.2)
Total Protein: 7.1 g/dL (ref 6.5–8.1)

## 2016-10-13 LAB — CBC
HCT: 40.9 % (ref 39.0–52.0)
Hemoglobin: 13.3 g/dL (ref 13.0–17.0)
MCH: 26.1 pg (ref 26.0–34.0)
MCHC: 32.5 g/dL (ref 30.0–36.0)
MCV: 80.4 fL (ref 78.0–100.0)
PLATELETS: 191 10*3/uL (ref 150–400)
RBC: 5.09 MIL/uL (ref 4.22–5.81)
RDW: 15.6 % — AB (ref 11.5–15.5)
WBC: 6.3 10*3/uL (ref 4.0–10.5)

## 2016-10-13 LAB — ETHANOL

## 2016-10-13 LAB — SALICYLATE LEVEL

## 2016-10-13 LAB — ACETAMINOPHEN LEVEL: Acetaminophen (Tylenol), Serum: <10 ug/mL — ABNORMAL LOW (ref 10–30)

## 2016-10-13 NOTE — [facility] Counselor (Signed)
Called to assess patient, informed patient was showering. TTS will call later to assess patient.

## 2016-10-13 NOTE — ED Notes (Addendum)
Pt placed in maroon scrubs, wanded by security, belongings in locker 5.

## 2016-10-13 NOTE — BH Assessment (Signed)
Logan Conn, NP recommends overnight observation for safety and stability, due to recent SI and hallucinations. Pt to be re-evaluated in the morning

## 2016-10-13 NOTE — ED Provider Notes (Signed)
[facility] Provider Note   CSN: [id number] Arrival date & time: 10/13/16  0000     History   Chief Complaint Chief Complaint  Patient presents with  . Suicidal    HPI Logan Hancock is a 26 y.o. male.  HPI  Patient, with past medical history of asthma, presents to ED for suicidal ideations. 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 States that he has been having suicidal ideations for quite some time but states that "I just want some help now and help for my addiction. " He has no access to firearms that his current home although he states that he "want to shoot myself." Denies any previous psychiatric diagnosis or medication use. He reports daily tobacco use but denies any alcohol or other drug use. He denies any homicidal ideations, auditory or visual hallucinations.  Past Medical History:  Diagnosis Date  . Asthma     There are no active problems to display for this patient.   History reviewed. No pertinent surgical history.     Home Medications    Prior to Admission medications   Medication Sig Start Date End Date Taking? Authorizing Provider  omeprazole (PRILOSEC) 20 MG capsule Take 1 capsule (20 mg total) by mouth daily. 08/21/15   Roxy Horseman, PA-C  predniSONE (DELTASONE) 20 MG tablet Take 3 tablets (60 mg total) by mouth daily. 01/22/14   Elpidio Anis, PA-C  sucralfate (CARAFATE) 1 GM/10ML suspension Take 10 mLs (1 g total) by mouth 4 (four) times daily -  with meals and at bedtime. 08/21/15   Roxy Horseman, PA-C    Family History No family history on file.  Social History Social History  Substance Use Topics  . Smoking status: Current Every Day Smoker    Types: Cigarettes  . Smokeless tobacco: Never Used  . Alcohol use Yes     Comment: occasional     Allergies   Patient has no known allergies.   Review of Systems Review of Systems  Constitutional: Negative for appetite change,  chills and fever.  HENT: Negative for ear pain, rhinorrhea, sneezing and sore throat.   Eyes: Negative for photophobia and visual disturbance.  Respiratory: Negative for cough, chest tightness, shortness of breath and wheezing.   Cardiovascular: Negative for chest pain and palpitations.  Gastrointestinal: Negative for abdominal pain, blood in stool, constipation, diarrhea, nausea and vomiting.  Genitourinary: Negative for dysuria, hematuria and urgency.  Musculoskeletal: Negative for myalgias.  Skin: Negative for rash.  Neurological: Negative for dizziness, weakness and light-headedness.  Psychiatric/Behavioral: Positive for suicidal ideas. Negative for agitation and hallucinations. The patient is not nervous/anxious.      Physical Exam Updated Vital Signs BP 121/60 (BP Location: Right Arm)   Pulse 77   Temp 97.7 F (36.5 C) (Oral)   Resp 16   Ht  (1.93 m)   Wt 109.8 kg (242 lb)   SpO2 99%   BMI 29.46 kg/m   Physical Exam  Constitutional: He appears well-developed and well-nourished. No distress.  Nontoxic appearing and in no acute distress.  HENT:  Head: Normocephalic and atraumatic.  Nose: Nose normal.  Eyes: Conjunctivae and EOM are normal. Left eye exhibits no discharge. No scleral icterus.  Neck: Normal range of motion. Neck supple.  Cardiovascular: Normal rate, regular rhythm, normal heart sounds and intact distal pulses.  Exam reveals no gallop and no friction rub.   No murmur heard. Pulmonary/Chest: Effort normal and  breath sounds normal. No respiratory distress.  Abdominal: Soft. Bowel sounds are normal. He exhibits no distension. There is no tenderness. There is no guarding.  Musculoskeletal: Normal range of motion. He exhibits no edema.  Neurological: He is alert. He exhibits normal muscle tone. Coordination normal.  Skin: Skin is warm and dry. No rash noted.  Psychiatric: He has a normal mood and affect. His behavior is normal. Thought content normal.    Nursing note and vitals reviewed.    ED Treatments / Results  Labs (all labs ordered are listed, but only abnormal results are displayed) Labs Reviewed  COMPREHENSIVE METABOLIC PANEL - Abnormal; Notable for the following:       Result Value   Chloride 99 (*)    Glucose, Bld 109 (*)    Calcium 8.8 (*)    AST 188 (*)    ALT 419 (*)    Alkaline Phosphatase 163 (*)    Total Bilirubin 1.8 (*)    All other components within normal limits  ACETAMINOPHEN LEVEL - Abnormal; Notable for the following:    Acetaminophen (Tylenol), Serum <10 (*)    All other components within normal limits  CBC - Abnormal; Notable for the following:    RDW 15.6 (*)    All other components within normal limits  RAPID URINE DRUG SCREEN, HOSP PERFORMED - Abnormal; Notable for the following:    Opiates POSITIVE (*)    Cocaine POSITIVE (*)    Amphetamines POSITIVE (*)    Tetrahydrocannabinol POSITIVE (*)    All other components within normal limits  ETHANOL  SALICYLATE LEVEL    EKG  EKG Interpretation None       Radiology No results found.  Procedures Procedures (including critical care time)  Medications Ordered in ED Medications - No data to display   Initial Impression / Assessment and Plan / ED Course  I have reviewed the triage vital signs and the nursing notes.  Pertinent labs & imaging results that were available during my care of the patient were reviewed by me and considered in my medical decision making (see chart for details).     Patient presents to ED for evaluation of suicidal ideations. Reports heroin use for the past 11 years with last use being yesterday. States that he will like to get some help for his suicidal ideations and addiction to heroin. No access to firearms. Denies any homicidal ideations, auditory or visual hallucinations. He is overall well-appearing. He denies any symptoms at this time. He denies any daily medication use. Lab workup unremarkable aside from bump  in LFTs. Vital signs including temperature, heart rate, blood pressure unremarkable. Will consult TTS for further evaluation. Patient is medically cleared for TTS consult.  Final Clinical Impressions(s) / ED Diagnoses   Final diagnoses:  None    New Prescriptions New Prescriptions   No medications on file     Dietrich Pates, PA-C 10/13/16 4098    Zadie Rhine, MD 10/14/16 [phone]

## 2016-10-13 NOTE — ED Triage Notes (Signed)
Pt states he has been using heroin x 11 years, recently has been thinking of harming himself by shooting himself. Denies having access to a gun. Last heroin use yesterday.

## 2016-10-13 NOTE — BH Assessment (Signed)
Tele Assessment Note   Patient Name: Logan Hancock MRN: [id number] Referring Physician: Dietrich Pates, PA-C Location of Patient: [facility] Location of Provider: Behavioral Health TTS Department  Logan Hancock is an 26 y.o. male presenting to [facility] expressing SI thoughts with plan to shoot self, yesterday. Denies SI today. Patient reports he does not own a gun but has access to one. Also, reports daily heroin use, up to 1 gram a day. Past SI attempts x2, once he overdosed and another time loaded a shot gun, but his grandfather found him and took the gun away. Patient indicates stressors involve a lack of family support due to drug use, homelessness and ongoing heroin use. Denies HI. has auditory hallucinations, last time on Monday. Reports mainly hearing mumbling noises. States the voices are worse when he's not using drugs. Paranoia, doesn't like to be in public or stand in lines, feels like others are talking about him. Report he has a paternal uncle with schizophrenia. His uncle and father are both drug and alcohol users.  The patient had a time of sobriety this year for 4 months, while in [place]. States the voices worsening during this time period. Relapse on heroin when he returned to the area. Denies prior inpatient. Denies prior outpatient. The patient was positive for opiates, cocaine, amphetamines, and cannabis.  The patient has unremarkable appearance, good eye contact, freedom of movement, alert, depressed mood and affect, severe anxiety, coherent thought, impaired judgment, and fair insight.   Nira Conn, NP recommends overnight observation for safety and stability, due to recent SI and hallucinations. Pt to be re-evaluated in the morning   Diagnosis: Substance induced mood disorder  Past Medical History:  Past Medical History:  Diagnosis Date  . Asthma     History reviewed. No pertinent surgical history.  Family History: No family history on file.  Social History:  reports that he has  been smoking Cigarettes.  He has never used smokeless tobacco. He reports that he drinks alcohol. He reports that he uses drugs, including Marijuana.  Additional Social History:  Alcohol / Drug Use Pain Medications: see MAR Prescriptions: see MAR Over the Counter: see MAR History of alcohol / drug use?: Yes Substance #1 Name of Substance 1: heroin 1 - Age of First Use: 14 or 15 1 - Amount (size/oz): 1 gram a day 1 - Frequency: daily 1 - Duration: years 1 - Last Use / Amount: yesterday, 5pm $20 hit, trying to come down off for a week Substance #2 Name of Substance 2: cannabis 2 - Age of First Use: UTA 2 - Amount (size/oz): a few hits 2 - Frequency: 1-2 times a week 2 - Duration: years 2 - Last Use / Amount: monday  CIWA: CIWA-Ar BP: (!) 127/56 Pulse Rate: 81 COWS:    PATIENT STRENGTHS: (choose at least two) Average or above average intelligence General fund of knowledge  Allergies: No Known Allergies  Home Medications:  (Not in a hospital admission)  OB/GYN Status:  No LMP for male patient.  General Assessment Data Location of Assessment: [facility] ED TTS Assessment: In system Is this a Tele or Face-to-Face Assessment?: Tele Assessment Is this an Initial Assessment or a Re-assessment for this encounter?: Initial Assessment Marital status: Single Is patient pregnant?: No Pregnancy Status: No Living Arrangements: Other (Comment) (homeless, hotels, ) Can pt return to current living arrangement?: Yes Admission Status: Voluntary Is patient capable of signing voluntary admission?: Yes Referral Source: Self/Family/Friend Insurance type: self pay  Medical Screening Exam (  [facility] Walk-in ONLY) Medical Exam completed: Yes  Crisis Care Plan Living Arrangements: Other (Comment) (homeless, hotels, ) Name of Psychiatrist: n/a Name of Therapist: n/a  Education Status Is patient currently in school?: No Current Grade: n/a Highest grade of school patient has completed: 9th  grade Name of school: n/a  Risk to self with the past 6 months Suicidal Ideation: No Has patient been a risk to self within the past 6 months prior to admission? : Yes (yesterday) Suicidal Intent: No Has patient had any suicidal intent within the past 6 months prior to admission? : Yes (yesterday) Is patient at risk for suicide?: Yes Suicidal Plan?: No-Not Currently/Within Last 6 Months Has patient had any suicidal plan within the past 6 months prior to admission? : Yes Specify Current Suicidal Plan: reports to shoot self, yesterday Access to Means: Yes Specify Access to Suicidal Means: doesnt have a gun but can get one What has been your use of drugs/alcohol within the last 12 months?: heroin, cannabis Previous Attempts/Gestures: Yes How many times?: 2 Other Self Harm Risks: OD and loaded a shoot gun Triggers for Past Attempts: Unknown Intentional Self Injurious Behavior: None Family Suicide History: Unknown Recent stressful life event(s): Other (Comment) (ongoing drug use,homeless) Persecutory voices/beliefs?: No Depression: Yes Depression Symptoms: Despondent, Insomnia Substance abuse history and/or treatment for substance abuse?: Yes Suicide prevention information given to non-admitted patients: Not applicable  Risk to Others within the past 6 months Homicidal Ideation: No Does patient have any lifetime risk of violence toward others beyond the six months prior to admission? : No Thoughts of Harm to Others: No Current Homicidal Intent: No Current Homicidal Plan: No Access to Homicidal Means: No Identified Victim: n/a History of harm to others?: No Assessment of Violence: None Noted Violent Behavior Description: n/a Does patient have access to weapons?: No Criminal Charges Pending?: No Does patient have a court date: No Is patient on probation?: No  Psychosis Hallucinations: Auditory Delusions: Unspecified  Mental Status Report Appearance/Hygiene: Unremarkable Eye  Contact: Good Motor Activity: Freedom of movement Speech: Logical/coherent Level of Consciousness: Alert Mood: Depressed Affect: Depressed Anxiety Level: Severe Thought Processes: Coherent, Relevant Judgement: Impaired Orientation: Person, Place, Time, Situation Obsessive Compulsive Thoughts/Behaviors: None  Cognitive Functioning Concentration: Normal Memory: Recent Intact, Remote Intact IQ: Average Insight: Fair Impulse Control: Fair Appetite: Poor Weight Loss: 0 Weight Gain: 0 Sleep: Decreased Vegetative Symptoms: None  ADLScreening [facility] Assessment Services) Patient's cognitive ability adequate to safely complete daily activities?: Yes Patient able to express need for assistance with ADLs?: Yes Independently performs ADLs?: Yes (appropriate for developmental age)  Prior Inpatient Therapy Prior Inpatient Therapy: No  Prior Outpatient Therapy Prior Outpatient Therapy: No Does patient have an ACCT team?: No Does patient have Intensive In-House Services?  : No Does patient have [facility] services? : No Does patient have P4CC services?: No  ADL Screening (condition at time of admission) Patient's cognitive ability adequate to safely complete daily activities?: Yes Is the patient deaf or have difficulty hearing?: No Does the patient have difficulty seeing, even when wearing glasses/contacts?: No Does the patient have difficulty concentrating, remembering, or making decisions?: No Patient able to express need for assistance with ADLs?: Yes Does the patient have difficulty dressing or bathing?: No Independently performs ADLs?: Yes (appropriate for developmental age)       Abuse/Neglect Assessment (Assessment to be complete while patient is alone) Physical Abuse:  (UTA) Verbal Abuse:  (UTA) Sexual Abuse:  (UTA)     Advance Directives (For Healthcare) Does  Patient Have a Medical Advance Directive?: No Would patient like information on creating a medical advance  directive?: No - Patient declined    Additional Information 1:1 In Past 12 Months?: No CIRT Risk: No Elopement Risk: No Does patient have medical clearance?: Yes     Disposition:  Disposition Initial Assessment Completed for this Encounter: Yes Disposition of Patient: Inpatient treatment program Type of inpatient treatment program: Adult  This service was provided via telemedicine using a 2-way, interactive audio and video technology.    Vonzell Schlatter Starnisha Batrez 10/13/2016 7:45 PM

## 2016-10-14 ENCOUNTER — Encounter (HOSPITAL_COMMUNITY): Payer: Self-pay | Admitting: Registered Nurse

## 2016-10-14 DIAGNOSIS — R4587 Impulsiveness: Secondary | ICD-10-CM

## 2016-10-14 DIAGNOSIS — F1910 Other psychoactive substance abuse, uncomplicated: Secondary | ICD-10-CM

## 2016-10-14 DIAGNOSIS — F1290 Cannabis use, unspecified, uncomplicated: Secondary | ICD-10-CM

## 2016-10-14 DIAGNOSIS — F17210 Nicotine dependence, cigarettes, uncomplicated: Secondary | ICD-10-CM

## 2016-10-14 DIAGNOSIS — F1994 Other psychoactive substance use, unspecified with psychoactive substance-induced mood disorder: Secondary | ICD-10-CM

## 2016-10-14 DIAGNOSIS — R45851 Suicidal ideations: Secondary | ICD-10-CM

## 2016-10-14 LAB — CBG MONITORING, ED: Glucose-Capillary: 81 mg/dL (ref 65–99)

## 2016-10-14 NOTE — ED Notes (Signed)
Patient got upset when he was asked to get off phone, He was on Phone for 30 min,Sercurity was called and Nurse went in to talk to patient and patient cursed at her.

## 2016-10-14 NOTE — ED Provider Notes (Signed)
Read note. Said to discharge. Will follow. Patient looks alert and oreinted.    Abelino Derrick, MD 10/14/16 [phone]

## 2016-10-14 NOTE — ED Notes (Signed)
Patient was given Graham Crackers and Sprite. 

## 2016-10-14 NOTE — ED Notes (Signed)
BH called and advised they would be discharging the patient home with outpatient resources.

## 2016-10-14 NOTE — Consult Note (Signed)
Telepsych Consultation   Reason for Consult:  Suicidal ideation Referring Physician:  Delia Heady, PA-C Location of Patient: [facility] Location of Provider: [facility]  Patient Identification: Logan Hancock MRN:  [id number] Principal Diagnosis: Substance induced mood disorder [facility]) Diagnosis:   Patient Active Problem List   Diagnosis Date Noted  . Substance induced mood disorder [facility]) [F19.94] 10/14/2016    Total Time spent with patient: 45 minutes  Subjective:   Logan Hancock is a 26 y.o. male patient presents to [facility] with complaints of suicidal thoughts with plan to shoot self; and polysubstance abuse .  HPI: Logan Hancock, 26 y.o., male patient seen by this provider on 10/14/16.  Chart reviewed and consulted with Dr. Dwyane Dee.  On evaluation Logan Hancock reports "When I first came in I wanted to get off heroin and go to rehab.  My friend to me to say that I was suicidal; that I wanted to kill myself; that is what he did to get in.  But, I don't want to kill myself.  I don't even know why I listened to him."  Patient reports that he was sober (clean) for 8 months and recently fell off of wagon.  States that he does not need inpatient at psychiatric hospital..  He denies suicidal/homicidal ideation, psychosis, and paranoia.  States that he needs to leave by 5 pm today so that he will have transportation home and he has a job "lined up for Tuesday and a spot that I can stay; but I need to get out of here." During interview patient has a pleasant affect; calm/cooperative, and alert/oriented x 4.  He does not appear to have internal/external stimuli.  He denies suicidal/homicidal ideation, psychosis, and paranoia.    Past Psychiatric History: Polysubstance abuse; substance induced mood disorder  Risk to Self: Suicidal Ideation: No Suicidal Intent: No Is patient at risk for suicide?: Yes Suicidal Plan?: No-Not Currently/Within Last 6 Months Specify Current Suicidal Plan: reports  to shoot self, yesterday Access to Means: Yes Specify Access to Suicidal Means: doesnt have a gun but can get one What has been your use of drugs/alcohol within the last 12 months?: heroin, cannabis How many times?: 2 Other Self Harm Risks: OD and loaded a shoot gun Triggers for Past Attempts: Unknown Intentional Self Injurious Behavior: None Risk to Others: Homicidal Ideation: No Thoughts of Harm to Others: No Current Homicidal Intent: No Current Homicidal Plan: No Access to Homicidal Means: No Identified Victim: n/a History of harm to others?: No Assessment of Violence: None Noted Violent Behavior Description: n/a Does patient have access to weapons?: No Criminal Charges Pending?: No Does patient have a court date: No Prior Inpatient Therapy: Prior Inpatient Therapy: No Prior Outpatient Therapy: Prior Outpatient Therapy: No Does patient have an ACCT team?: No Does patient have Intensive In-House Services?  : No Does patient have [facility] services? : No Does patient have P4CC services?: No  Past Medical History:  Past Medical History:  Diagnosis Date  . Asthma    History reviewed. No pertinent surgical history. Family History: History reviewed. No pertinent family history. Family Psychiatric  History: Unaware Social History:  History  Alcohol Use  . Yes    Comment: occasional     History  Drug Use  . Types: Marijuana    Comment: Heroin    Social History   Social History  . Marital status: Single    Spouse name: N/A  . Number of children: N/A  . Years  of education: N/A   Social History Main Topics  . Smoking status: Current Every Day Smoker    Types: Cigarettes  . Smokeless tobacco: Never Used  . Alcohol use Yes     Comment: occasional  . Drug use: Yes    Types: Marijuana     Comment: Heroin  . Sexual activity: Not Asked   Other Topics Concern  . None   Social History Narrative  . None   Additional Social History:    Allergies:  No Known  Allergies  Labs:  Results for orders placed or performed during the hospital encounter of 10/13/16 (from the past 48 hour(s))  Comprehensive metabolic panel     Status: Abnormal   Collection Time: 10/13/16 12:39 AM  Result Value Ref Range   Sodium 135 135 - 145 mmol/L   Potassium 3.7 3.5 - 5.1 mmol/L   Chloride 99 (L) 101 - 111 mmol/L   CO2 23 22 - 32 mmol/L   Glucose, Bld 109 (H) 65 - 99 mg/dL   BUN 12 6 - 20 mg/dL   Creatinine, Ser 1.00 0.61 - 1.24 mg/dL   Calcium 8.8 (L) 8.9 - 10.3 mg/dL   Total Protein 7.1 6.5 - 8.1 g/dL   Albumin 3.9 3.5 - 5.0 g/dL   AST 188 (H) 15 - 41 U/L   ALT 419 (H) 17 - 63 U/L   Alkaline Phosphatase 163 (H) 38 - 126 U/L   Total Bilirubin 1.8 (H) 0.3 - 1.2 mg/dL   GFR calc non Af Amer >60 >60 mL/min   GFR calc Af Amer >60 >60 mL/min    Comment: (NOTE) The eGFR has been calculated using the CKD EPI equation. This calculation has not been validated in all clinical situations. eGFR's persistently <60 mL/min signify possible Chronic Kidney Disease.    Anion gap 13 5 - 15  Ethanol     Status: None   Collection Time: 10/13/16 12:39 AM  Result Value Ref Range   Alcohol, Ethyl (B) <10 <10 mg/dL    Comment:        LOWEST DETECTABLE LIMIT FOR SERUM ALCOHOL IS 10 mg/dL FOR MEDICAL PURPOSES ONLY Please note change in reference range.   Salicylate level     Status: None   Collection Time: 10/13/16 12:39 AM  Result Value Ref Range   Salicylate Lvl <7.8 2.8 - 30.0 mg/dL  Acetaminophen level     Status: Abnormal   Collection Time: 10/13/16 12:39 AM  Result Value Ref Range   Acetaminophen (Tylenol), Serum <10 (L) 10 - 30 ug/mL    Comment:        THERAPEUTIC CONCENTRATIONS VARY SIGNIFICANTLY. A RANGE OF 10-30 ug/mL MAY BE AN EFFECTIVE CONCENTRATION FOR MANY PATIENTS. HOWEVER, SOME ARE BEST TREATED AT CONCENTRATIONS OUTSIDE THIS RANGE. ACETAMINOPHEN CONCENTRATIONS >150 ug/mL AT 4 HOURS AFTER INGESTION AND >50 ug/mL AT 12 HOURS AFTER INGESTION  ARE OFTEN ASSOCIATED WITH TOXIC REACTIONS.   cbc     Status: Abnormal   Collection Time: 10/13/16 12:39 AM  Result Value Ref Range   WBC 6.3 4.0 - 10.5 K/uL   RBC 5.09 4.22 - 5.81 MIL/uL   Hemoglobin 13.3 13.0 - 17.0 g/dL   HCT 40.9 39.0 - 52.0 %   MCV 80.4 78.0 - 100.0 fL   MCH 26.1 26.0 - 34.0 pg   MCHC 32.5 30.0 - 36.0 g/dL   RDW 15.6 (H) 11.5 - 15.5 %   Platelets 191 150 - 400 K/uL  Rapid urine drug  screen (hospital performed)     Status: Abnormal   Collection Time: 10/13/16 12:42 AM  Result Value Ref Range   Opiates POSITIVE (A) NONE DETECTED   Cocaine POSITIVE (A) NONE DETECTED   Benzodiazepines NONE DETECTED NONE DETECTED   Amphetamines POSITIVE (A) NONE DETECTED   Tetrahydrocannabinol POSITIVE (A) NONE DETECTED   Barbiturates NONE DETECTED NONE DETECTED    Comment:        DRUG SCREEN FOR MEDICAL PURPOSES ONLY.  IF CONFIRMATION IS NEEDED FOR ANY PURPOSE, NOTIFY LAB WITHIN 5 DAYS.        LOWEST DETECTABLE LIMITS FOR URINE DRUG SCREEN Drug Class       Cutoff (ng/mL) Amphetamine      1000 Barbiturate      200 Benzodiazepine   671 Tricyclics       245 Opiates          300 Cocaine          300 THC              50   CBG monitoring, ED     Status: None   Collection Time: 10/14/16  7:08 AM  Result Value Ref Range   Glucose-Capillary 81 65 - 99 mg/dL    Medications:  No current facility-administered medications for this encounter.    Current Outpatient Prescriptions  Medication Sig Dispense Refill  . omeprazole (PRILOSEC) 20 MG capsule Take 1 capsule (20 mg total) by mouth daily. (Patient not taking: Reported on 10/13/2016) 30 capsule 0  . predniSONE (DELTASONE) 20 MG tablet Take 3 tablets (60 mg total) by mouth daily. (Patient not taking: Reported on 10/13/2016) 6 tablet 0  . sucralfate (CARAFATE) 1 GM/10ML suspension Take 10 mLs (1 g total) by mouth 4 (four) times daily -  with meals and at bedtime. (Patient not taking: Reported on 10/13/2016) 420 mL 0     Musculoskeletal: Strength & Muscle Tone: within normal limits Gait & Station: normal Patient leans: N/A  Psychiatric Specialty Exam: Physical Exam  Nursing note and vitals reviewed. Constitutional: He is oriented to person, place, and time.  Neck: Normal range of motion.  Respiratory: Effort normal.  Neurological: He is alert and oriented to person, place, and time.  Psychiatric: He has a normal mood and affect. His speech is normal and behavior is normal. Thought content normal. Cognition and memory are normal. He expresses impulsivity.    Review of Systems  Psychiatric/Behavioral: Positive for substance abuse. Depression: Denies. Hallucinations: Denies. Memory loss: Denies. Suicidal ideas: Denies. Nervous/anxious: Denies. Insomnia: Denies.   All other systems reviewed and are negative.   Blood pressure 133/62, pulse 78, temperature 98.4 F (36.9 C), temperature source Oral, resp. rate 16, height '6\' 4"'  (1.93 m), weight 109.8 kg (242 lb), SpO2 100 %.Body mass index is 29.46 kg/m.  General Appearance: Casual and Fairly Groomed  Eye Contact:  Good  Speech:  Clear and Coherent and Normal Rate  Volume:  Normal  Mood:  "I'm good ready to go"  Affect:  Appropriate  Thought Process:  Coherent and Goal Directed  Orientation:  Full (Time, Place, and Person)  Thought Content:  Logical  Suicidal Thoughts:  No  Homicidal Thoughts:  No  Memory:  Immediate;   Good Recent;   Good Remote;   Good  Judgement:  Fair  Insight:  Fair  Psychomotor Activity:  Normal  Concentration:  Concentration: Good and Attention Span: Good  Recall:  Good  Fund of Knowledge:  Fair  Language:  Good  Akathisia:  No  Handed:  Right  AIMS (if indicated):     Assets:  Communication Skills Desire for Improvement Physical Health Social Support  ADL's:  Intact  Cognition:  WNL  Sleep:        Treatment Plan Summary: Plan Discharge with substance abuse resources  Disposition: No evidence of imminent  risk to self or others at present.   Patient does not meet criteria for psychiatric inpatient admission.  This service was provided via telemedicine using a 2-way, interactive audio and video technology.  Names of all persons participating in this telemedicine service and their role in this encounter. Name: Earleen Newport NP Role: Telepsych  Name: Dr. Dwyane Dee Role: Psychiatrist  Name:  Role:   Name:  Role:     Earleen Newport, NP 10/14/2016 3:40 PM

## 2016-10-14 NOTE — ED Notes (Signed)
Patient using phone at this time.

## 2016-12-02 ENCOUNTER — Other Ambulatory Visit: Payer: Self-pay

## 2016-12-02 ENCOUNTER — Encounter (HOSPITAL_COMMUNITY): Payer: Self-pay | Admitting: *Deleted

## 2016-12-02 ENCOUNTER — Emergency Department (HOSPITAL_COMMUNITY)
Admission: EM | Admit: 2016-12-02 | Discharge: 2016-12-03 | Disposition: A | Discharge status: Other Acute Inpatient Hospital | Payer: Self-pay | Attending: Emergency Medicine | Admitting: Emergency Medicine

## 2016-12-02 DIAGNOSIS — F17210 Nicotine dependence, cigarettes, uncomplicated: Secondary | ICD-10-CM | POA: Insufficient documentation

## 2016-12-02 DIAGNOSIS — R45851 Suicidal ideations: Secondary | ICD-10-CM | POA: Insufficient documentation

## 2016-12-02 DIAGNOSIS — F1193 Opioid use, unspecified with withdrawal: Secondary | ICD-10-CM

## 2016-12-02 DIAGNOSIS — F1910 Other psychoactive substance abuse, uncomplicated: Secondary | ICD-10-CM | POA: Insufficient documentation

## 2016-12-02 DIAGNOSIS — Z046 Encounter for general psychiatric examination, requested by authority: Secondary | ICD-10-CM | POA: Insufficient documentation

## 2016-12-02 DIAGNOSIS — J45909 Unspecified asthma, uncomplicated: Secondary | ICD-10-CM | POA: Insufficient documentation

## 2016-12-02 DIAGNOSIS — F419 Anxiety disorder, unspecified: Secondary | ICD-10-CM | POA: Insufficient documentation

## 2016-12-02 DIAGNOSIS — Z590 Homelessness: Secondary | ICD-10-CM | POA: Insufficient documentation

## 2016-12-02 DIAGNOSIS — F1123 Opioid dependence with withdrawal: Secondary | ICD-10-CM | POA: Insufficient documentation

## 2016-12-02 DIAGNOSIS — Z79899 Other long term (current) drug therapy: Secondary | ICD-10-CM | POA: Insufficient documentation

## 2016-12-02 LAB — CBC
HCT: 39.7 % (ref 39.0–52.0)
HEMOGLOBIN: 13.1 g/dL (ref 13.0–17.0)
MCH: 26.8 pg (ref 26.0–34.0)
MCHC: 33 g/dL (ref 30.0–36.0)
MCV: 81.4 fL (ref 78.0–100.0)
Platelets: 160 10*3/uL (ref 150–400)
RBC: 4.88 MIL/uL (ref 4.22–5.81)
RDW: 15.8 % — ABNORMAL HIGH (ref 11.5–15.5)
WBC: 8.8 10*3/uL (ref 4.0–10.5)

## 2016-12-02 LAB — ETHANOL: Alcohol, Ethyl (B): <10 mg/dL (ref <10)

## 2016-12-02 LAB — COMPREHENSIVE METABOLIC PANEL
ALBUMIN: 3.7 g/dL (ref 3.5–5.0)
ALK PHOS: 70 U/L (ref 38–126)
ALT: 139 U/L — ABNORMAL HIGH (ref 17–63)
AST: 68 U/L — ABNORMAL HIGH (ref 15–41)
Anion gap: 7 (ref 5–15)
BILIRUBIN TOTAL: 1.1 mg/dL (ref 0.3–1.2)
BUN: 9 mg/dL (ref 6–20)
CALCIUM: 8.8 mg/dL — AB (ref 8.9–10.3)
CO2: 24 mmol/L (ref 22–32)
Chloride: 108 mmol/L (ref 101–111)
Creatinine, Ser: 0.91 mg/dL (ref 0.61–1.24)
GFR calc Af Amer: >60 mL/min (ref >60)
GFR calc non Af Amer: >60 mL/min (ref >60)
Glucose, Bld: 104 mg/dL — ABNORMAL HIGH (ref 65–99)
Potassium: 3.3 mmol/L — ABNORMAL LOW (ref 3.5–5.1)
Sodium: 139 mmol/L (ref 135–145)
Total Protein: 6.7 g/dL (ref 6.5–8.1)

## 2016-12-02 LAB — RAPID URINE DRUG SCREEN, HOSP PERFORMED
AMPHETAMINES: POSITIVE — AB
BARBITURATES: NOT DETECTED
Benzodiazepines: NOT DETECTED
Cocaine: POSITIVE — AB
Opiates: NOT DETECTED
TETRAHYDROCANNABINOL: NOT DETECTED

## 2016-12-02 LAB — LIPASE, BLOOD: LIPASE: 42 U/L (ref 11–51)

## 2016-12-02 MED ORDER — METHOCARBAMOL 1000 MG/10ML IJ SOLN
1000.0000 mg | Freq: Once | INTRAVENOUS | Status: AC
  Administered 2016-12-02: 1000 mg via INTRAVENOUS
  Filled 2016-12-02: qty 10

## 2016-12-02 MED ORDER — ONDANSETRON HCL 4 MG/2ML IJ SOLN
4.0000 mg | Freq: Once | INTRAMUSCULAR | Status: AC
  Administered 2016-12-02: 4 mg via INTRAVENOUS
  Filled 2016-12-02: qty 2

## 2016-12-02 MED ORDER — ONDANSETRON 4 MG PO TBDP: 4.0000 mg | ORAL_TABLET | Freq: Four times a day (QID) | ORAL | Status: DC | PRN

## 2016-12-02 MED ORDER — NAPROXEN 500 MG PO TABS
500.0000 mg | ORAL_TABLET | Freq: Two times a day (BID) | ORAL | Status: DC | PRN
  Administered 2016-12-02: 500 mg via ORAL
  Filled 2016-12-02: qty 1

## 2016-12-02 MED ORDER — METHOCARBAMOL 500 MG PO TABS: 1000.0000 mg | ORAL_TABLET | Freq: Three times a day (TID) | ORAL | Status: DC | PRN

## 2016-12-02 MED ORDER — CLONIDINE HCL 0.1 MG PO TABS: 0.1000 mg | ORAL_TABLET | Freq: Every day | ORAL | Status: DC

## 2016-12-02 MED ORDER — CLONIDINE HCL 0.1 MG PO TABS
0.1000 mg | ORAL_TABLET | Freq: Four times a day (QID) | ORAL | Status: DC
  Administered 2016-12-02: 0.1 mg via ORAL
  Filled 2016-12-02: qty 1

## 2016-12-02 MED ORDER — HYDROXYZINE HCL 25 MG PO TABS
25.0000 mg | ORAL_TABLET | Freq: Four times a day (QID) | ORAL | Status: DC | PRN
  Administered 2016-12-02: 25 mg via ORAL
  Filled 2016-12-02: qty 1

## 2016-12-02 MED ORDER — CLONIDINE HCL 0.1 MG PO TABS: 0.1000 mg | ORAL_TABLET | ORAL | Status: DC

## 2016-12-02 MED ORDER — DICYCLOMINE HCL 20 MG PO TABS
20.0000 mg | ORAL_TABLET | Freq: Four times a day (QID) | ORAL | Status: DC | PRN
  Administered 2016-12-02: 20 mg via ORAL
  Filled 2016-12-02: qty 1

## 2016-12-02 MED ORDER — LOPERAMIDE HCL 2 MG PO CAPS
2.0000 mg | ORAL_CAPSULE | ORAL | Status: DC | PRN
Start: 2016-12-02 — End: 2016-12-03

## 2016-12-02 MED ORDER — METHOCARBAMOL 1000 MG/10ML IJ SOLN: 1000.0000 mg | Freq: Once | INTRAMUSCULAR | Status: DC

## 2016-12-02 MED ORDER — SODIUM CHLORIDE 0.9 % IV BOLUS (SEPSIS)
1000.0000 mL | Freq: Once | INTRAVENOUS | Status: AC
  Administered 2016-12-02: 1000 mL via INTRAVENOUS

## 2016-12-02 NOTE — ED Notes (Signed)
Pt placed in paper scrubs, wanded by security and belongings placed in locker #27 of the TCU. Pt also has a large indigo suite case that is at the TCU nurses station with pt label on it.
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
## 2016-12-02 NOTE — ED Triage Notes (Signed)
Pt states that he is wanting to detox from heroin. Last used 5 days ago. Pt had tried detoxing in the past on his own and because of the upcoming holidays and not being able to see his family because of his drug use, he wants to get clean. Pt states that he was recently in prison and after his release started using again. He also states that he is homeless. Pt reports using cocaine, marijuana and suboxone in the past recent days to help with withdrawal symptoms of heroin.

## 2016-12-02 NOTE — ED Provider Notes (Signed)
[facility]-EMERGENCY DEPT Provider Note   CSN: [id number] Arrival date & time: 12/02/16  1920     History   Chief Complaint Chief Complaint  Patient presents with  . Suicidal    HPI Logan Hancock is a 26 y.o. male.  HPI   Reports desire for detox from heroin Reports he tried to kill himself by overdosing last week but didn't have enough money to buy enough heroin to overdose.  He also reports he did not tell us this initially as he didn't want to be "locked away in a crazy house"   Reports long history of using heroin.  Most recent use was 5 days ago.  Since he stopped using, reports he has had nausea, vomiting, diarrhea, chills and anxiety.  Reports diffuse body aches.  Reports the nausea, vomiting and diarrhea has improved some, and he was able to tolerated ginger ale on arrival to the emergency department, which he was unable to do previously at home.  Denies fevers.  Reports he injects heroin, and has had a few episodes where his friend gave him intranasal naloxone after he had overdosed and "appeared purple."  Reports his mom lives far away, and he does not clean and sober support here in town.  Reports he is homeless.  Reports he is not sure if he would be safe if he left the emergency department, as he attempted to overdose on heroin 1 week ago to kill himself, but did not have enough money to buy enough heroin to cause an overdose.  Past Medical History:  Diagnosis Date  . Asthma     Patient Active Problem List   Diagnosis Date Noted  . Substance induced mood disorder (HCC) 10/14/2016  . Polysubstance abuse (HCC) 10/14/2016  . Suicidal ideation     History reviewed. No pertinent surgical history.     Home Medications    Prior to Admission medications   Medication Sig Start Date End Date Taking? Authorizing Provider  buprenorphine-naloxone (SUBOXONE) 8-2 mg SUBL SL tablet Place 1 tablet under the tongue once.   Yes [provider]  omeprazole (PRILOSEC) 20 MG capsule Take 1 capsule (20 mg total) by mouth daily. Patient not taking: Reported on 10/13/2016 08/21/15   Roxy Horseman, PA-C  predniSONE (DELTASONE) 20 MG tablet Take 3 tablets (60 mg total) by mouth daily. Patient not taking: Reported on 10/13/2016 01/22/14   Elpidio Anis, PA-C  sucralfate (CARAFATE) 1 GM/10ML suspension Take 10 mLs (1 g total) by mouth 4 (four) times daily -  with meals and at bedtime. Patient not taking: Reported on 10/13/2016 08/21/15   Roxy Horseman, PA-C    Family History History reviewed. No pertinent family history.  Social History Social History   Tobacco Use  . Smoking status: Current Every Day Smoker    Packs/day: 0.50    Years: 2.00    Pack years: 1.00    Types: Cigarettes  . Smokeless tobacco: Never Used  Substance Use Topics  . Alcohol use: No    Frequency: Never  . Drug use: Yes    Frequency: 3.0 times per week    Types: Marijuana, Cocaine    Comment: Heroin     Allergies   Patient has no known allergies.   Review of Systems Review of Systems  Constitutional: Positive for chills. Negative for fever.  HENT: Negative for sore throat.   Eyes: Negative for visual disturbance.  Respiratory: Negative for shortness of breath.   Cardiovascular: Negative for  chest pain.  Gastrointestinal: Positive for diarrhea, nausea and vomiting. Negative for abdominal pain.  Genitourinary: Negative for difficulty urinating.  Musculoskeletal: Negative for back pain and neck stiffness.  Skin: Negative for rash.  Neurological: Positive for headaches. Negative for syncope.  Psychiatric/Behavioral: Positive for suicidal ideas. The patient is nervous/anxious.      Physical Exam Updated Vital Signs BP (!) 141/83 (BP Location: Left Arm)   Pulse 92   Temp 98.5 F (36.9 C) (Oral)   Resp 18   SpO2 98%   Physical Exam  Constitutional: He is oriented to person, place, and time. He appears well-developed and well-nourished.  No distress.  HENT:  Head: Normocephalic and atraumatic.  Eyes: Conjunctivae and EOM are normal.  Neck: Normal range of motion.  Cardiovascular: Normal rate, regular rhythm, normal heart sounds and intact distal pulses. Exam reveals no gallop and no friction rub.  No murmur heard. Pulmonary/Chest: Effort normal and breath sounds normal. No respiratory distress. He has no wheezes. He has no rales.  Abdominal: Soft. He exhibits no distension. There is no tenderness. There is no guarding.  Musculoskeletal: He exhibits no edema.  Neurological: He is alert and oriented to person, place, and time.  Skin: Skin is warm and dry. He is not diaphoretic.  Nursing note and vitals reviewed.    ED Treatments / Results  Labs (all labs ordered are listed, but only abnormal results are displayed) Labs Reviewed  COMPREHENSIVE METABOLIC PANEL - Abnormal; Notable for the following components:      Result Value   Potassium 3.3 (*)    Glucose, Bld 104 (*)    Calcium 8.8 (*)    AST 68 (*)    ALT 139 (*)    All other components within normal limits  CBC - Abnormal; Notable for the following components:   RDW 15.8 (*)    All other components within normal limits  RAPID URINE DRUG SCREEN, HOSP PERFORMED - Abnormal; Notable for the following components:   Cocaine POSITIVE (*)    Amphetamines POSITIVE (*)    All other components within normal limits  ETHANOL  LIPASE, BLOOD    EKG  EKG Interpretation None       Radiology No results found.  Procedures Procedures (including critical care time)  Medications Ordered in ED Medications  dicyclomine (BENTYL) tablet 20 mg (20 mg Oral Given 12/02/16 2358)  hydrOXYzine (ATARAX/VISTARIL) tablet 25 mg (25 mg Oral Given 12/02/16 2357)  loperamide (IMODIUM) capsule 2-4 mg (not administered)  methocarbamol (ROBAXIN) tablet 1,000 mg (not administered)  naproxen (NAPROSYN) tablet 500 mg (500 mg Oral Given 12/02/16 2358)  ondansetron (ZOFRAN-ODT)  disintegrating tablet 4 mg (not administered)  cloNIDine (CATAPRES) tablet 0.1 mg (0.1 mg Oral Given 12/02/16 2357)    Followed by  cloNIDine (CATAPRES) tablet 0.1 mg (not administered)    Followed by  cloNIDine (CATAPRES) tablet 0.1 mg (not administered)  albuterol (PROVENTIL HFA;VENTOLIN HFA) 108 (90 Base) MCG/ACT inhaler 2 puff (2 puffs Inhalation Given 12/03/16 0017)  sodium chloride 0.9 % bolus 1,000 mL (1,000 mLs Intravenous New Bag/Given 12/02/16 2356)  ondansetron (ZOFRAN) injection 4 mg (4 mg Intravenous Given 12/02/16 2357)  methocarbamol (ROBAXIN) 1,000 mg in dextrose 5 % 50 mL IVPB (1,000 mg Intravenous New Bag/Given 12/02/16 2357)     Initial Impression / Assessment and Plan / ED Course  I have reviewed the triage vital signs and the nursing notes.  Pertinent labs & imaging results that were available during my care of the patient  were reviewed by me and considered in my medical decision making (see chart for details).    26yo male with history of asthma and polysubstance abuse presents with concern for opiate withdrawal. During evaluation, also reports he attempted to overdose on heroin last week to kill himself and acknowledges some suicidal thoughts.  He is medically cleared, has symptoms consistent with opiate withdrawal.  Ordered medications, IV fluid to help with rehydration given n/v/d.  Has chronic LFT elevation less than prior.  Placed orders to help with opiate withdrawal.    TTS consulted and [facility]    Prior to transfer, pt reports wheezing. Has hx of asthma and prn albuterol ordered. Stable for transfer.   Final Clinical Impressions(s) / ED Diagnoses   Final diagnoses:  Polysubstance abuse (HCC)  Suicidal ideation  Opiate withdrawal [facility](HCC)    ED Discharge Orders    None       Alvira MondaySchlossman, Nolie Bignell, MD 12/03/16 [phone]

## 2016-12-03 ENCOUNTER — Inpatient Hospital Stay (HOSPITAL_COMMUNITY)
Admission: AD | Admit: 2016-12-03 | Discharge: 2016-12-07 | DRG: 897 | Disposition: A | Discharge status: Home / Self Care | Payer: Federal, State, Local not specified - Other | Source: Intra-hospital | Attending: Psychiatry | Admitting: Psychiatry

## 2016-12-03 ENCOUNTER — Other Ambulatory Visit: Payer: Self-pay

## 2016-12-03 ENCOUNTER — Encounter (HOSPITAL_COMMUNITY): Payer: Self-pay

## 2016-12-03 DIAGNOSIS — F1120 Opioid dependence, uncomplicated: Principal | ICD-10-CM | POA: Diagnosis present

## 2016-12-03 DIAGNOSIS — F1410 Cocaine abuse, uncomplicated: Secondary | ICD-10-CM | POA: Diagnosis not present

## 2016-12-03 DIAGNOSIS — Z590 Homelessness: Secondary | ICD-10-CM | POA: Diagnosis not present

## 2016-12-03 DIAGNOSIS — F1490 Cocaine use, unspecified, uncomplicated: Secondary | ICD-10-CM | POA: Diagnosis present

## 2016-12-03 DIAGNOSIS — F419 Anxiety disorder, unspecified: Secondary | ICD-10-CM | POA: Diagnosis present

## 2016-12-03 DIAGNOSIS — F17210 Nicotine dependence, cigarettes, uncomplicated: Secondary | ICD-10-CM | POA: Diagnosis not present

## 2016-12-03 DIAGNOSIS — F329 Major depressive disorder, single episode, unspecified: Secondary | ICD-10-CM | POA: Diagnosis not present

## 2016-12-03 DIAGNOSIS — Z23 Encounter for immunization: Secondary | ICD-10-CM | POA: Diagnosis not present

## 2016-12-03 DIAGNOSIS — Z818 Family history of other mental and behavioral disorders: Secondary | ICD-10-CM

## 2016-12-03 DIAGNOSIS — Z560 Unemployment, unspecified: Secondary | ICD-10-CM

## 2016-12-03 DIAGNOSIS — G4700 Insomnia, unspecified: Secondary | ICD-10-CM | POA: Diagnosis present

## 2016-12-03 DIAGNOSIS — F332 Major depressive disorder, recurrent severe without psychotic features: Secondary | ICD-10-CM | POA: Diagnosis not present

## 2016-12-03 DIAGNOSIS — F1199 Opioid use, unspecified with unspecified opioid-induced disorder: Secondary | ICD-10-CM

## 2016-12-03 DIAGNOSIS — Z915 Personal history of self-harm: Secondary | ICD-10-CM

## 2016-12-03 DIAGNOSIS — F1190 Opioid use, unspecified, uncomplicated: Secondary | ICD-10-CM

## 2016-12-03 DIAGNOSIS — J45909 Unspecified asthma, uncomplicated: Secondary | ICD-10-CM | POA: Diagnosis present

## 2016-12-03 DIAGNOSIS — R450 Nervousness: Secondary | ICD-10-CM

## 2016-12-03 DIAGNOSIS — Z811 Family history of alcohol abuse and dependence: Secondary | ICD-10-CM | POA: Diagnosis not present

## 2016-12-03 MED ORDER — NAPROXEN 500 MG PO TABS
500.0000 mg | ORAL_TABLET | Freq: Two times a day (BID) | ORAL | Status: DC | PRN
  Administered 2016-12-03 – 2016-12-06 (×2): 500 mg via ORAL
  Filled 2016-12-03 (×2): qty 1

## 2016-12-03 MED ORDER — POTASSIUM CHLORIDE CRYS ER 20 MEQ PO TBCR
20.0000 meq | EXTENDED_RELEASE_TABLET | Freq: Two times a day (BID) | ORAL | Status: AC
  Administered 2016-12-03 – 2016-12-04 (×3): 20 meq via ORAL
  Filled 2016-12-03 (×4): qty 1

## 2016-12-03 MED ORDER — ALUM & MAG HYDROXIDE-SIMETH 200-200-20 MG/5ML PO SUSP: 30.0000 mL | ORAL | Status: DC | PRN

## 2016-12-03 MED ORDER — METHOCARBAMOL 500 MG PO TABS
500.0000 mg | ORAL_TABLET | Freq: Three times a day (TID) | ORAL | Status: DC | PRN
  Administered 2016-12-03 – 2016-12-06 (×4): 500 mg via ORAL
  Filled 2016-12-03 (×4): qty 1

## 2016-12-03 MED ORDER — ALBUTEROL SULFATE HFA 108 (90 BASE) MCG/ACT IN AERS: 2.0000 | INHALATION_SPRAY | Freq: Four times a day (QID) | RESPIRATORY_TRACT | Status: DC | PRN

## 2016-12-03 MED ORDER — TRAZODONE HCL 50 MG PO TABS
50.0000 mg | ORAL_TABLET | Freq: Every evening | ORAL | Status: DC | PRN
  Administered 2016-12-03 – 2016-12-06 (×4): 50 mg via ORAL
  Filled 2016-12-03 (×4): qty 1
  Filled 2016-12-03: qty 7

## 2016-12-03 MED ORDER — ACETAMINOPHEN 325 MG PO TABS
650.0000 mg | ORAL_TABLET | Freq: Four times a day (QID) | ORAL | Status: DC | PRN
  Administered 2016-12-06: 650 mg via ORAL
  Filled 2016-12-03: qty 2

## 2016-12-03 MED ORDER — ONDANSETRON 4 MG PO TBDP: 4.0000 mg | ORAL_TABLET | Freq: Four times a day (QID) | ORAL | Status: DC | PRN

## 2016-12-03 MED ORDER — INFLUENZA VAC SPLIT QUAD 0.5 ML IM SUSY
0.5000 mL | PREFILLED_SYRINGE | INTRAMUSCULAR | Status: AC
  Administered 2016-12-04: 0.5 mL via INTRAMUSCULAR
  Filled 2016-12-03: qty 0.5

## 2016-12-03 MED ORDER — ALBUTEROL SULFATE HFA 108 (90 BASE) MCG/ACT IN AERS
2.0000 | INHALATION_SPRAY | Freq: Four times a day (QID) | RESPIRATORY_TRACT | Status: DC | PRN
  Administered 2016-12-03: 2 via RESPIRATORY_TRACT
  Filled 2016-12-03: qty 6.7

## 2016-12-03 MED ORDER — HYDROXYZINE HCL 25 MG PO TABS
25.0000 mg | ORAL_TABLET | Freq: Four times a day (QID) | ORAL | Status: DC | PRN
  Administered 2016-12-03 – 2016-12-06 (×2): 25 mg via ORAL
  Filled 2016-12-03: qty 1
  Filled 2016-12-03: qty 10
  Filled 2016-12-03: qty 1

## 2016-12-03 MED ORDER — CLONIDINE HCL 0.1 MG PO TABS
0.1000 mg | ORAL_TABLET | Freq: Every day | ORAL | Status: DC
  Filled 2016-12-03 (×2): qty 1

## 2016-12-03 MED ORDER — MAGNESIUM HYDROXIDE 400 MG/5ML PO SUSP: 30.0000 mL | Freq: Every day | ORAL | Status: DC | PRN

## 2016-12-03 MED ORDER — LOPERAMIDE HCL 2 MG PO CAPS: 2.0000 mg | ORAL_CAPSULE | ORAL | Status: DC | PRN

## 2016-12-03 MED ORDER — DICYCLOMINE HCL 20 MG PO TABS
20.0000 mg | ORAL_TABLET | Freq: Four times a day (QID) | ORAL | Status: DC | PRN
  Administered 2016-12-03: 20 mg via ORAL
  Filled 2016-12-03: qty 1

## 2016-12-03 MED ORDER — CLONIDINE HCL 0.1 MG PO TABS
0.1000 mg | ORAL_TABLET | ORAL | Status: DC
  Administered 2016-12-05 – 2016-12-07 (×3): 0.1 mg via ORAL
  Filled 2016-12-03 (×4): qty 1

## 2016-12-03 MED ORDER — CLONIDINE HCL 0.1 MG PO TABS
0.1000 mg | ORAL_TABLET | Freq: Four times a day (QID) | ORAL | Status: AC
  Administered 2016-12-03 – 2016-12-05 (×6): 0.1 mg via ORAL
  Filled 2016-12-03 (×12): qty 1

## 2016-12-03 MED ORDER — BUPROPION HCL ER (XL) 150 MG PO TB24
150.0000 mg | ORAL_TABLET | Freq: Every day | ORAL | Status: DC
  Administered 2016-12-03 – 2016-12-05 (×3): 150 mg via ORAL
  Filled 2016-12-03 (×6): qty 1
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
## 2016-12-03 NOTE — Progress Notes (Signed)
Pt is a 26 year old male admitted with polysubstance dependence and self harm thoughts    He reports a history of suicide attempts   His plan was to OD on drugs but he didn't have enough or shoot himself but doesn't have a gun    He is homeless and wants to get clean so he can go back around his family  He said his girlfriend is also homeless uses drugs and is pregnant     Pt oriented to the unit and given nourishment   Assessment completed and orders received  Did not administer any medication because he had some before he left [facility]   Q 15 min checks    Pt remains safe at present
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
## 2016-12-03 NOTE — [facility] Suicide Risk Assessment (Signed)
[facility]   Nursing information obtained from:   chart review Demographic factors:   male. Paternal uncle- schizophrenia Current Mental Status:   depressed Loss Factors:   discordance with his wife Historical Factors:   suicide attempt in the past, substance use Risk Reduction Factors:   amenable to treatment  Total Time spent with patient: 20 minutes Principal Problem: Heroin use disorder, severe (HCC) Diagnosis:   Patient Active Problem List   Diagnosis Date Noted  . Severe recurrent major depression without psychotic features (HCC) [F33.2] 12/03/2016  . Heroin use disorder, severe (HCC) [F11.20] 12/03/2016  . Substance induced mood disorder (HCC) [F19.94] 10/14/2016  . Polysubstance abuse (HCC) [F19.10] 10/14/2016  . Suicidal ideation [R45.851]    Subjective Data:  Logan Hancock is a 26 year old male with heroin use disorder, depression, who presented to ED with heroin use, SI. He reportedly tried to overdose on heroin, although he did not have enough money to get this. [facility] and [facility] withdrawal. Uds positive for cocaine, amphetamine, EtOH<10  He states that he has been feeling depressed, has SI for a while. He is unemployed since this year as his mother was unable to help him for transportation anymore. He talks about his girlfriend who became pregnant; he does not think this as positive as she continues to use drugs. He declines to disclose her name nor her contact number. He wants to feel better. He hopes to get detox from heroin and is motivated for abstinence. His last heroin use was five days ago; he complains of muscle cramp, yawning. He had diarrhea a few days ago. He reports history of suicide attempt by trying to overdose heroin last year, although he did not seek medical care at that time. He denies gun access at home. He has SI without plans. He denies HI, AH/VH.   Continued Clinical Symptoms:  Alcohol Use Disorder  Identification Test Final Score (AUDIT): 2 The "Alcohol Use Disorders Identification Test", Guidelines for Use in Primary Care, Second Edition.  World Science writerHealth Organization [facility](WHO). Score between 0-7:  no or low risk or alcohol related problems. Score between 8-15:  moderate risk of alcohol related problems. Score between 16-19:  high risk of alcohol related problems. Score 20 or above:  warrants further diagnostic evaluation for alcohol dependence and treatment.   CLINICAL FACTORS:   Depression:   Hopelessness   Musculoskeletal: Strength & Muscle Tone: within normal limits Gait & Station: normal Patient leans: N/A  Psychiatric Specialty Exam: Physical Exam  Nursing note and vitals reviewed. agrees with ED evaluation  Review of Systems  Musculoskeletal: Positive for myalgias.  Psychiatric/Behavioral: Positive for depression, substance abuse and suicidal ideas. Negative for hallucinations and memory loss. The patient is nervous/anxious and has insomnia.   All other systems reviewed and are negative.   Blood pressure 114/69, pulse (!) 59, temperature 97.9 F (36.6 C), temperature source Oral, resp. rate 20, height 6\' 4"  (1.93 m), weight 235 lb (106.6 kg).Body mass index is 28.61 kg/m.  General Appearance: Fairly Groomed  Eye Contact:  Minimal  Speech:  Clear and Coherent  Volume:  Normal  Mood:  Depressed  Affect:  Blunt  Thought Process:  Coherent  Orientation:  Full (Time, Place, and Person)  Thought Content:  Logical Perceptions: denies AH/VH  Suicidal Thoughts:  Yes.  without intent/plan  Homicidal Thoughts:  No  Memory:  Immediate;   Fair  Judgement:  Impaired  Insight:  Present  Psychomotor Activity:  Normal  Concentration:  Concentration: Fair and Attention Span: Fair  Recall:  [employer]  Fund of Knowledge:  Fair  Language:  Good  Akathisia:  No  Handed:  Right  AIMS (if indicated):     Assets:  Communication Skills Desire for Improvement  ADL's:  Intact  Cognition:   WNL  Sleep:  Number of Hours: 1      COGNITIVE FEATURES THAT CONTRIBUTE TO RISK:  Closed-mindedness    SUICIDE RISK:   Moderate:  Frequent suicidal ideation with limited intensity, and duration, some specificity in terms of plans, no associated intent, good self-control, limited dysphoria/symptomatology, some risk factors present, and identifiable protective factors, including available and accessible social support.  PLAN OF CARE:  Patient will be admitted to inpatient psychiatric unit for stabilization and safety. Will provide and encourage milieu participation. Provide medication management and make adjustments as needed.  Will follow daily.   I certify that inpatient services furnished can reasonably be expected to improve the patient's condition.   Neysa Hottereina Juanitta Earnhardt, MD 12/03/2016, 2:10 PM

## 2016-12-03 NOTE — Tx Team (Addendum)
Initial Treatment Plan 12/03/2016 3:48 AM Logan Hancock [place] [id number]    PATIENT STRESSORS: Medication change or noncompliance Occupational concerns Substance abuse   PATIENT STRENGTHS: General fund of knowledge Motivation for treatment/growth   PATIENT IDENTIFIED PROBLEMS: "maybe get on some medications"  "to get all the way clean from drugs and cigarettes"                   DISCHARGE CRITERIA:  Improved stabilization in mood, thinking, and/or behavior Verbal commitment to aftercare and medication compliance Withdrawal symptoms are absent or subacute and managed without 24-hour nursing intervention  PRELIMINARY DISCHARGE PLAN: Attend 12-step recovery group Placement in alternative living arrangements  PATIENT/FAMILY INVOLVEMENT: This treatment plan has been presented to and reviewed with the patient, Logan Hancock [place], and/or family member, .  The patient and family have been given the opportunity to ask questions and make suggestions.  Andrena Mewsuttall, Syanne Looney J, RN 12/03/2016, 3:48 AM

## 2016-12-03 NOTE — Progress Notes (Signed)
D. Pt presents with an anxious affect and depressed behavior- staying in bed for most of the morning. Pt rates his depression a 5/10 and anxiety a 7/10 per pt's self inventory. Pt denies having any complaints when asked, but endorses pain (achy muscles, head and joints) on pt's self inventory. Pt refuses prn medication at this time- opting to sleep it off.  Pt endorses passive SI- no thoughts at this time, but they come and go. Pt verbally contracts for safety. A. Labs and vitals monitored. Pt compliant with medications. Pt supported emotionally and encouraged to express concerns and ask questions.   R. Pt remains safe with 15 minute checks. Will continue POC.

## 2016-12-03 NOTE — [facility] Group Notes (Signed)
[facility] Group Notes: (Clinical Social Work)   12/03/2016      Type of Therapy:  Group Therapy   Participation Level:  Did Not Attend despite MHT prompting   Shiloh Southern Grossman-Orr, LCSW 12/03/2016, 12:28 PM     

## 2016-12-04 DIAGNOSIS — F17210 Nicotine dependence, cigarettes, uncomplicated: Secondary | ICD-10-CM

## 2016-12-04 DIAGNOSIS — F332 Major depressive disorder, recurrent severe without psychotic features: Secondary | ICD-10-CM

## 2016-12-04 DIAGNOSIS — R45851 Suicidal ideations: Secondary | ICD-10-CM

## 2016-12-04 DIAGNOSIS — F1210 Cannabis abuse, uncomplicated: Secondary | ICD-10-CM

## 2016-12-04 LAB — COMPREHENSIVE METABOLIC PANEL
ALK PHOS: 92 U/L (ref 38–126)
ALT: 232 U/L — ABNORMAL HIGH (ref 17–63)
ANION GAP: 8 (ref 5–15)
AST: 122 U/L — ABNORMAL HIGH (ref 15–41)
Albumin: 3.6 g/dL (ref 3.5–5.0)
BILIRUBIN TOTAL: 0.7 mg/dL (ref 0.3–1.2)
BUN: 9 mg/dL (ref 6–20)
CALCIUM: 8.6 mg/dL — AB (ref 8.9–10.3)
CO2: 23 mmol/L (ref 22–32)
Chloride: 107 mmol/L (ref 101–111)
Creatinine, Ser: 1.02 mg/dL (ref 0.61–1.24)
GFR calc Af Amer: >60 mL/min (ref >60)
Glucose, Bld: 87 mg/dL (ref 65–99)
Potassium: 4.1 mmol/L (ref 3.5–5.1)
SODIUM: 138 mmol/L (ref 135–145)
TOTAL PROTEIN: 6.6 g/dL (ref 6.5–8.1)

## 2016-12-04 MED ORDER — NICOTINE 21 MG/24HR TD PT24
21.0000 mg | MEDICATED_PATCH | TRANSDERMAL | Status: DC
  Administered 2016-12-04: 21 mg via TRANSDERMAL
  Filled 2016-12-04 (×4): qty 1

## 2016-12-04 NOTE — [facility] Group Notes (Signed)
[facility] Group Notes: (Clinical Social Work)   12/04/2016      Type of Therapy:  Group Therapy   Participation Level:  Did Not Attend despite MHT prompting   Osiris Odriscoll Grossman-Orr, LCSW 12/04/2016, 12:24 PM     

## 2016-12-04 NOTE — Plan of Care (Signed)
  Progressing Safety: Periods of time without injury will increase 12/04/2016 0010 - Progressing by Arrie Aranhurch, Kapil Petropoulos J, RN Note Pt has not harmed self or others tonight.  He denies SI/HI and verbally contracts for safety.

## 2016-12-04 NOTE — Progress Notes (Signed)
D: Pt was in bed in his room upon initial approach.  Pt presents with anxious, depressed affect and mood.  He reports he feels "like shit" and his goal is "trying to get some sleep, eat, get back to feeling better."  Pt denies SI/HI, denies hallucinations, complains of generalized pain of 3/10.  Pt has stayed in his room for the majority of the evening.  Pt did not attend evening group.    A: Introduced self to pt.  Actively listened to pt and offered support and encouragement. PRN medication administered for anxiety, sleep, and pain from muscle spasms.  PO fluids encouraged and provided.  Q15 minute safety checks maintained.  R: Pt is safe on the unit.  Scheduled Clonidine was held because pt was hypotensive.  Pt is compliant with medications.  Pt verbally contracts for safety.  Will continue to monitor and assess.

## 2016-12-04 NOTE — Progress Notes (Signed)
D. Pt has been observed interacting in dayroom with peers throughout day. Pt reports feeling better today. Per pt's self inventory, patient rates his depression, hopelessness and anxiety a 5/0/6, respectively. Pt currently denies SI/HI and AVH and agrees to contact staff before acting on any harmful thoughts.  A. Labs and vitals monitored. Pt compliant with medications. Pt supported emotionally and encouraged to express concerns and ask questions.   R. Pt remains safe with 15 minute checks. Will continue POC.

## 2016-12-04 NOTE — Progress Notes (Signed)
[facility] MD Progress Note  12/04/2016 9:47 AM Logan Hancock  MRN:  [id number] Subjective:   Patient states that he feels better this morning. He has not been able to contact with his mother or his girlfriend. He believes that his mother is out for vacation, although she will be back this Wednesday. He states that he will not be able to live with his mother, who lives with his grandparents and aunt who "hate me." He states that he has been using heroin since teenager and they disliked what he did. He has less craving for heroin. He has less muscle cramps or yawning. He continues to feel depressed and has anhedonia, SI without plan; he believes it has been less intense. He feels more anxious since admission. He denies panic attacks. He denies HI, AH/VH.   Principal Problem: Heroin use disorder, severe ([place]) Diagnosis:   Patient Active Problem List   Diagnosis Date Noted  . Severe recurrent major depression without psychotic features ([place]) [F33.2] 12/03/2016  . Heroin use disorder, severe ([place]) [F11.20] 12/03/2016  . Substance induced mood disorder ([place]) [F19.94] 10/14/2016  . Polysubstance abuse ([place]) [F19.10] 10/14/2016  . Suicidal ideation [R45.851]    Total Time spent with patient: 15 minutes  Past Psychiatric History: See initial H&P for dull details. Reviewed, with no updates at this time.   Past Medical History:  Past Medical History:  Diagnosis Date  . Asthma    History reviewed. No pertinent surgical history. Family History: History reviewed. No pertinent family history. Family Psychiatric  History: See initial H&P for dull details. Reviewed, with no updates at this time.  Social History:  Social History   Substance and Sexual Activity  Alcohol Use No  . Frequency: Never     Social History   Substance and Sexual Activity  Drug Use Yes  . Frequency: 3.0 times per week  . Types: Marijuana, Cocaine   Comment: Heroin    Social History   Socioeconomic History  . Marital  status: Single    Spouse name: None  . Number of children: None  . Years of education: None  . Highest education level: None  Social Needs  . Financial resource strain: None  . Food insecurity - worry: None  . Food insecurity - inability: None  . Transportation needs - medical: None  . Transportation needs - non-medical: None  Occupational History  . None  Tobacco Use  . Smoking status: Current Every Day Smoker    Packs/day: 0.50    Years: 2.00    Pack years: 1.00    Types: Cigarettes  . Smokeless tobacco: Never Used  Substance and Sexual Activity  . Alcohol use: No    Frequency: Never  . Drug use: Yes    Frequency: 3.0 times per week    Types: Marijuana, Cocaine    Comment: Heroin  . Sexual activity: Yes    Birth control/protection: None  Other Topics Concern  . None  Social History Narrative  . None   Additional Social History:    Pain Medications: see MAR Prescriptions: see MAR Over the Counter: see MAR History of alcohol / drug use?: Yes Negative Consequences of Use: Financial, Personal relationships Withdrawal Symptoms: Irritability, Fever / Chills, Tremors Name of Substance 1: heroin 1 - Age of First Use: 14 or 15 1 - Amount (size/oz): 1 gram a day 1 - Frequency: daily 1 - Duration: years 1 - Last Use / Amount: 11/27/16 Name of Substance 2: Cocaine (snorting) 2 -  Age of First Use: 15 2 - Amount (size/oz): $30 worth 2 - Frequency: 2-3 times per week 2 - Duration: Ongoing 2 - Last Use / Amount: 11/29/16                Sleep: Good  Appetite:  Good  Current Medications: Current Facility-Administered Medications  Medication Dose Route Frequency Provider Last Rate Last Dose  . acetaminophen (TYLENOL) tablet 650 mg  650 mg Oral Q6H PRN Lindon Romp A, NP      . albuterol (PROVENTIL HFA;VENTOLIN HFA) 108 (90 Base) MCG/ACT inhaler 2 puff  2 puff Inhalation Q6H PRN Money, Lowry Ram, FNP      . alum & mag hydroxide-simeth (MAALOX/MYLANTA) 200-200-20  MG/5ML suspension 30 mL  30 mL Oral Q4H PRN Lindon Romp A, NP      . buPROPion (WELLBUTRIN XL) 24 hr tablet 150 mg  150 mg Oral Daily Derrill Center, NP   150 mg at 12/04/16 0921  . cloNIDine (CATAPRES) tablet 0.1 mg  0.1 mg Oral QID Lindon Romp A, NP   0.1 mg at 12/03/16 1311   Followed by  . [START ON 12/05/2016] cloNIDine (CATAPRES) tablet 0.1 mg  0.1 mg Oral BH-qamhs Rozetta Nunnery, NP       Followed by  . [START ON 12/08/2016] cloNIDine (CATAPRES) tablet 0.1 mg  0.1 mg Oral QAC breakfast Lindon Romp A, NP      . dicyclomine (BENTYL) tablet 20 mg  20 mg Oral Q6H PRN Lindon Romp A, NP   20 mg at 12/03/16 1836  . hydrOXYzine (ATARAX/VISTARIL) tablet 25 mg  25 mg Oral Q6H PRN Lindon Romp A, NP   25 mg at 12/03/16 2108  . loperamide (IMODIUM) capsule 2-4 mg  2-4 mg Oral PRN Lindon Romp A, NP      . magnesium hydroxide (MILK OF MAGNESIA) suspension 30 mL  30 mL Oral Daily PRN Lindon Romp A, NP      . methocarbamol (ROBAXIN) tablet 500 mg  500 mg Oral Q8H PRN Lindon Romp A, NP   500 mg at 12/03/16 2108  . naproxen (NAPROSYN) tablet 500 mg  500 mg Oral BID PRN Lindon Romp A, NP   500 mg at 12/03/16 1315  . ondansetron (ZOFRAN-ODT) disintegrating tablet 4 mg  4 mg Oral Q6H PRN Rozetta Nunnery, NP      . traZODone (DESYREL) tablet 50 mg  50 mg Oral QHS PRN Rozetta Nunnery, NP   50 mg at 12/03/16 2107    Lab Results:  Results for orders placed or performed during the hospital encounter of 12/02/16 (from the past 48 hour(s))  Comprehensive metabolic panel     Status: Abnormal   Collection Time: 12/02/16  7:51 PM  Result Value Ref Range   Sodium 139 135 - 145 mmol/L   Potassium 3.3 (L) 3.5 - 5.1 mmol/L   Chloride 108 101 - 111 mmol/L   CO2 24 22 - 32 mmol/L   Glucose, Bld 104 (H) 65 - 99 mg/dL   BUN 9 6 - 20 mg/dL   Creatinine, Ser 0.91 0.61 - 1.24 mg/dL   Calcium 8.8 (L) 8.9 - 10.3 mg/dL   Total Protein 6.7 6.5 - 8.1 g/dL   Albumin 3.7 3.5 - 5.0 g/dL   AST 68 (H) 15 - 41 U/L   ALT  139 (H) 17 - 63 U/L   Alkaline Phosphatase 70 38 - 126 U/L   Total Bilirubin 1.1 0.3 - 1.2 mg/dL  GFR calc non Af Amer >60 >60 mL/min   GFR calc Af Amer >60 >60 mL/min    Comment: (NOTE) The eGFR has been calculated using the CKD EPI equation. This calculation has not been validated in all clinical situations. eGFR's persistently <60 mL/min signify possible Chronic Kidney Disease.    Anion gap 7 5 - 15  Ethanol     Status: None   Collection Time: 12/02/16  7:51 PM  Result Value Ref Range   Alcohol, Ethyl (B) <10 <10 mg/dL    Comment:        LOWEST DETECTABLE LIMIT FOR SERUM ALCOHOL IS 10 mg/dL FOR MEDICAL PURPOSES ONLY   cbc     Status: Abnormal   Collection Time: 12/02/16  7:51 PM  Result Value Ref Range   WBC 8.8 4.0 - 10.5 K/uL   RBC 4.88 4.22 - 5.81 MIL/uL   Hemoglobin 13.1 13.0 - 17.0 g/dL   HCT 39.7 39.0 - 52.0 %   MCV 81.4 78.0 - 100.0 fL   MCH 26.8 26.0 - 34.0 pg   MCHC 33.0 30.0 - 36.0 g/dL   RDW 15.8 (H) 11.5 - 15.5 %   Platelets 160 150 - 400 K/uL  Rapid urine drug screen (hospital performed)     Status: Abnormal   Collection Time: 12/02/16  7:51 PM  Result Value Ref Range   Opiates NONE DETECTED NONE DETECTED   Cocaine POSITIVE (A) NONE DETECTED   Benzodiazepines NONE DETECTED NONE DETECTED   Amphetamines POSITIVE (A) NONE DETECTED   Tetrahydrocannabinol NONE DETECTED NONE DETECTED   Barbiturates NONE DETECTED NONE DETECTED    Comment:        DRUG SCREEN FOR MEDICAL PURPOSES ONLY.  IF CONFIRMATION IS NEEDED FOR ANY PURPOSE, NOTIFY LAB WITHIN 5 DAYS.        LOWEST DETECTABLE LIMITS FOR URINE DRUG SCREEN Drug Class       Cutoff (ng/mL) Amphetamine      1000 Barbiturate      200 Benzodiazepine   614 Tricyclics       431 Opiates          300 Cocaine          300 THC              50   Lipase, blood     Status: None   Collection Time: 12/02/16  9:17 PM  Result Value Ref Range   Lipase 42 11 - 51 U/L    Blood Alcohol level:  Lab Results   Component Value Date   ETH <10 12/02/2016   ETH <10 54/00/8676    Metabolic Disorder Labs: No results found for: HGBA1C, MPG No results found for: PROLACTIN No results found for: CHOL, TRIG, HDL, CHOLHDL, VLDL, LDLCALC  Physical Findings: AIMS: Facial and Oral Movements Muscles of Facial Expression: None, normal Lips and Perioral Area: None, normal Jaw: None, normal Tongue: None, normal,Extremity Movements Upper (arms, wrists, hands, fingers): None, normal Lower (legs, knees, ankles, toes): None, normal, Trunk Movements Neck, shoulders, hips: None, normal, Overall Severity Severity of abnormal movements (highest score from questions above): None, normal Incapacitation due to abnormal movements: None, normal Patient's awareness of abnormal movements (rate only patient's report): No Awareness, Dental Status Current problems with teeth and/or dentures?: No Does patient usually wear dentures?: No  CIWA:  CIWA-Ar Total: 1 COWS:  COWS Total Score: 1  Musculoskeletal: Strength & Muscle Tone: within normal limits Gait & Station: normal Patient leans: N/A  Psychiatric Specialty  Exam: Physical Exam  Review of Systems  Musculoskeletal: Positive for myalgias.  Psychiatric/Behavioral: Positive for depression and suicidal ideas. Negative for hallucinations, memory loss and substance abuse. The patient is nervous/anxious. The patient does not have insomnia.     Blood pressure (!) 99/59, pulse 87, temperature (!) 97.5 F (36.4 C), temperature source Oral, resp. rate 16, height _0  (1.93 m), weight 235 lb (106.6 kg).Body mass index is 28.61 kg/m.  General Appearance: Fairly Groomed  Eye Contact:  Fair  Speech:  Clear and Coherent  Volume:  Normal  Mood:  "anxious"  Affect:  Appropriate and Restricted- improving  Thought Process:  Coherent and Goal Directed  Orientation:  Full (Time, Place, and Person)  Thought Content:  Logical Perceptions: denies AH/VH  Suicidal Thoughts:  Yes.   without intent/plan  Homicidal Thoughts:  No  Memory:  Immediate;   Fair  Judgement:  Fair  Insight:  Present  Psychomotor Activity:  Normal  Concentration:  Concentration: Fair and Attention Span: Fair  Recall:  Good  Fund of Knowledge:  Good  Language:  Good  Akathisia:  No  Handed:  Right  AIMS (if indicated):     Assets:  Communication Skills Desire for Improvement  ADL's:  Intact  Cognition:  WNL  Sleep:  Number of Hours: 6.75   AADAM ZHEN is a 26 year old male with heroin use disorder, depression, who presented to ED in the setting of heroin use and SI. He reportedly tried to overdose on heroin, although he did not have enough money to do this. Admitted to [facility] for continued care. UDs positive for amphetamine, cocaine, EtOH<10. Psychosocial stressors including discordance with his girlfriend who became pregnant, unemployment and homeless.   # MDD, recurrent without psychotic features # r/o substance induced mood disorder There has been improvement in his neurovegetative symptoms since admission. Will continue Wellbutrin at the current dose to target depression; he is aware of its potential side effect of worsening anxiety.  - continue Wellbutrin 150 mg daily   # Heroin use disorder # Heroin withdrawal  On clonidine protocol. Less withdrawal symptoms on today's evaluation. Will continue to monitor. He is interested in [place]; consider referral upon discharge.  - on clonidine protocol - - Labs pending for hepatitis panel, HIV, CMP given needle use and elevated LFT  Noted his girlfriend, who is reportedly pregnant and has ongoing substance use; although we need to report this to CPS, he does not disclose any contact information. Will continue to work on obtaining information.   Treatment Plan Summary: Daily contact with patient to assess and evaluate symptoms and progress in treatment  Norman Clay, MD 12/04/2016, 9:47 AM

## 2016-12-05 LAB — HIV ANTIBODY (ROUTINE TESTING W REFLEX): HIV SCREEN 4TH GENERATION: NONREACTIVE

## 2016-12-05 MED ORDER — NICOTINE 21 MG/24HR TD PT24
21.0000 mg | MEDICATED_PATCH | Freq: Every day | TRANSDERMAL | Status: DC
  Administered 2016-12-06 – 2016-12-07 (×2): 21 mg via TRANSDERMAL
  Filled 2016-12-05 (×4): qty 1

## 2016-12-05 NOTE — Progress Notes (Signed)
[facility] MD Progress Note  12/05/2016 2:11 PM PURL CLAYTOR  MRN:  [id number] Subjective:  Logan Hancock reports " I am feeling a little better' - Patient is requesting to be discharged by Wednesday   Objective: Logan Hancock is awake, alert and oriented. Seen resting in dayroom. Reports mild depression today, however overall is feeling better.Denies suicidal or homicidal ideation. Denies auditory or visual hallucination and does not appear to be responding to internal stimuli. Patient interacts well with staff and others.  Patient reports he is medication compliant without mediation side effects. Repots taken Wellbutrin and feels "okay"  States his depression 5/10.states he has a good appetite and reports he is resting well. Support, encouragement and reassurance was provided.   Principal Problem: Heroin use disorder, severe ([place]) Diagnosis:   Patient Active Problem List   Diagnosis Date Noted  . Severe recurrent major depression without psychotic features ([place]) [F33.2] 12/03/2016  . Heroin use disorder, severe ([place]) [F11.20] 12/03/2016  . Substance induced mood disorder ([place]) [F19.94] 10/14/2016  . Polysubstance abuse ([place]) [F19.10] 10/14/2016  . Suicidal ideation [R45.851]    Total Time spent with patient: 30 minutes  Past Psychiatric History:   Past Medical History:  Past Medical History:  Diagnosis Date  . Asthma    History reviewed. No pertinent surgical history. Family History: History reviewed. No pertinent family history. Family Psychiatric  History: Social History:  Social History   Substance and Sexual Activity  Alcohol Use No  . Frequency: Never     Social History   Substance and Sexual Activity  Drug Use Yes  . Frequency: 3.0 times per week  . Types: Marijuana, Cocaine   Comment: Heroin    Social History   Socioeconomic History  . Marital status: Single    Spouse name: None  . Number of children: None  . Years of education: None  . Highest education level: None   Social Needs  . Financial resource strain: None  . Food insecurity - worry: None  . Food insecurity - inability: None  . Transportation needs - medical: None  . Transportation needs - non-medical: None  Occupational History  . None  Tobacco Use  . Smoking status: Current Every Day Smoker    Packs/day: 0.50    Years: 2.00    Pack years: 1.00    Types: Cigarettes  . Smokeless tobacco: Never Used  Substance and Sexual Activity  . Alcohol use: No    Frequency: Never  . Drug use: Yes    Frequency: 3.0 times per week    Types: Marijuana, Cocaine    Comment: Heroin  . Sexual activity: Yes    Birth control/protection: None  Other Topics Concern  . None  Social History Narrative  . None   Additional Social History:    Pain Medications: see MAR Prescriptions: see MAR Over the Counter: see MAR History of alcohol / drug use?: Yes Negative Consequences of Use: Financial, Personal relationships Withdrawal Symptoms: Irritability, Fever / Chills, Tremors Name of Substance 1: heroin 1 - Age of First Use: 14 or 15 1 - Amount (size/oz): 1 gram a day 1 - Frequency: daily 1 - Duration: years 1 - Last Use / Amount: 11/27/16 Name of Substance 2: Cocaine (snorting) 2 - Age of First Use: 15 2 - Amount (size/oz): $30 worth 2 - Frequency: 2-3 times per week 2 - Duration: Ongoing 2 - Last Use / Amount: 11/29/16  Sleep: Fair  Appetite:  Fair  Current Medications: Current Facility-Administered Medications  Medication Dose Route Frequency Provider Last Rate Last Dose  . acetaminophen (TYLENOL) tablet 650 mg  650 mg Oral Q6H PRN Lindon Romp A, NP      . albuterol (PROVENTIL HFA;VENTOLIN HFA) 108 (90 Base) MCG/ACT inhaler 2 puff  2 puff Inhalation Q6H PRN Money, Lowry Ram, FNP      . alum & mag hydroxide-simeth (MAALOX/MYLANTA) 200-200-20 MG/5ML suspension 30 mL  30 mL Oral Q4H PRN Lindon Romp A, NP      . buPROPion (WELLBUTRIN XL) 24 hr tablet 150 mg  150 mg Oral  Daily Derrill Center, NP   150 mg at 12/05/16 0806  . cloNIDine (CATAPRES) tablet 0.1 mg  0.1 mg Oral QID Lindon Romp A, NP   0.1 mg at 12/05/16 1204   Followed by  . cloNIDine (CATAPRES) tablet 0.1 mg  0.1 mg Oral BH-qamhs Rozetta Nunnery, NP       Followed by  . [START ON 12/08/2016] cloNIDine (CATAPRES) tablet 0.1 mg  0.1 mg Oral QAC breakfast Lindon Romp A, NP      . dicyclomine (BENTYL) tablet 20 mg  20 mg Oral Q6H PRN Lindon Romp A, NP   20 mg at 12/03/16 1836  . hydrOXYzine (ATARAX/VISTARIL) tablet 25 mg  25 mg Oral Q6H PRN Lindon Romp A, NP   25 mg at 12/03/16 2108  . loperamide (IMODIUM) capsule 2-4 mg  2-4 mg Oral PRN Lindon Romp A, NP      . magnesium hydroxide (MILK OF MAGNESIA) suspension 30 mL  30 mL Oral Daily PRN Lindon Romp A, NP      . methocarbamol (ROBAXIN) tablet 500 mg  500 mg Oral Q8H PRN Lindon Romp A, NP   500 mg at 12/04/16 2121  . naproxen (NAPROSYN) tablet 500 mg  500 mg Oral BID PRN Lindon Romp A, NP   500 mg at 12/03/16 1315  . nicotine (NICODERM CQ - dosed in mg/24 hours) patch 21 mg  21 mg Transdermal Q24H Cobos, Myer Peer, MD   21 mg at 12/04/16 1833  . ondansetron (ZOFRAN-ODT) disintegrating tablet 4 mg  4 mg Oral Q6H PRN Rozetta Nunnery, NP      . traZODone (DESYREL) tablet 50 mg  50 mg Oral QHS PRN Rozetta Nunnery, NP   50 mg at 12/04/16 2201    Lab Results:  Results for orders placed or performed during the hospital encounter of 12/03/16 (from the past 48 hour(s))  Comprehensive metabolic panel     Status: Abnormal   Collection Time: 12/04/16  6:40 PM  Result Value Ref Range   Sodium 138 135 - 145 mmol/L   Potassium 4.1 3.5 - 5.1 mmol/L   Chloride 107 101 - 111 mmol/L   CO2 23 22 - 32 mmol/L   Glucose, Bld 87 65 - 99 mg/dL   BUN 9 6 - 20 mg/dL   Creatinine, Ser 1.02 0.61 - 1.24 mg/dL   Calcium 8.6 (L) 8.9 - 10.3 mg/dL   Total Protein 6.6 6.5 - 8.1 g/dL   Albumin 3.6 3.5 - 5.0 g/dL   AST 122 (H) 15 - 41 U/L   ALT 232 (H) 17 - 63 U/L    Alkaline Phosphatase 92 38 - 126 U/L   Total Bilirubin 0.7 0.3 - 1.2 mg/dL   GFR calc non Af Amer >60 >60 mL/min   GFR calc Af Amer >60 >60 mL/min  Comment: (NOTE) The eGFR has been calculated using the CKD EPI equation. This calculation has not been validated in all clinical situations. eGFR's persistently <60 mL/min signify possible Chronic Kidney Disease.    Anion gap 8 5 - 15    Comment: Performed at [facility], [place] [address] [place], [place] [address]    Blood Alcohol level:  Lab Results  Component Value Date   ETH <10 12/02/2016   ETH <10 37/90/2409    Metabolic Disorder Labs: No results found for: HGBA1C, MPG No results found for: PROLACTIN No results found for: CHOL, TRIG, HDL, CHOLHDL, VLDL, LDLCALC  Physical Findings: AIMS: Facial and Oral Movements Muscles of Facial Expression: None, normal Lips and Perioral Area: None, normal Jaw: None, normal Tongue: None, normal,Extremity Movements Upper (arms, wrists, hands, fingers): None, normal Lower (legs, knees, ankles, toes): None, normal, Trunk Movements Neck, shoulders, hips: None, normal, Overall Severity Severity of abnormal movements (highest score from questions above): None, normal Incapacitation due to abnormal movements: None, normal Patient's awareness of abnormal movements (rate only patient's report): No Awareness, Dental Status Current problems with teeth and/or dentures?: No Does patient usually wear dentures?: No  CIWA:  CIWA-Ar Total: 1 COWS:  COWS Total Score: 0  Musculoskeletal: Strength & Muscle Tone: within normal limits Gait & Station: normal Patient leans: N/A  Psychiatric Specialty Exam: Physical Exam  Vitals reviewed. Constitutional: He appears well-developed.  Cardiovascular: Normal rate.  Neurological: He is alert.  Psychiatric: He has a normal mood and affect. His behavior is normal.    Review of Systems  Psychiatric/Behavioral: Positive for depression.  Negative for suicidal ideas. The patient is nervous/anxious.     Blood pressure 120/64, pulse 83, temperature 98.5 F (36.9 C), temperature source Oral, resp. rate 16, height _0  (1.93 m), weight 106.6 kg (235 lb).Body mass index is 28.61 kg/m.  General Appearance: Casual  Eye Contact:  Good  Speech:  Clear and Coherent  Volume:  Normal  Mood:  Anxious and Depressed  Affect:  Appropriate  Thought Process:  Coherent  Orientation:  Full (Time, Place, and Person)  Thought Content:  Hallucinations: None  Suicidal Thoughts:  No  Homicidal Thoughts:  No  Memory:  Immediate;   Fair Recent;   Fair Remote;   Fair  Judgement:  Fair  Insight:  Fair  Psychomotor Activity:  Normal  Concentration:  Concentration: Fair  Recall:  [employer] of Knowledge:  Fair  Language:  Good  Akathisia:  No  Handed:  Right  AIMS (if indicated):     Assets:  Communication Skills Desire for Improvement Resilience Social Support  ADL's:  Intact  Cognition:  WNL  Sleep:  Number of Hours: 6.75     Treatment Plan Summary: Daily contact with patient to assess and evaluate symptoms and progress in treatment and Medication management   Continue with current treatment plan on 12/05/2016 except where noted  Continue  Wellbutrin 150 mg  for mood stabilization. Continue with Trazodone 50 mg for insomnia Started on COWS/ Conidine  Protocol Will continue to monitor vitals ,medication compliance and treatment side effects while patient is here.   Reviewed labs Potassium 3.3- Ku dual was added BAL - , UDS - pos for cocaine and amphetamines CSW will start working on disposition.  Patient to participate in therapeutic milieu    Derrill Center, NP 12/05/2016, 2:11 PM

## 2016-12-05 NOTE — Progress Notes (Signed)
D: Patient observed initially resting in bed. [place] for AM meds at 0815 and stated, "man, you can't get any sleep around here." Patient states he would like to discharge today and feels ready. "They keep telling me to talk to the doctor. Where is he?" Patient's affect animated, mood stable. Patient cursing frequently, flirtatious with staff at times. Per self inventory and discussions with writer, rates depression at a 1/10, hopelessness at a 0/10 and anxiety at a 5/10. Rates sleep as good, appetite as good, energy as low and concentration as poor.  States goal for today is "getting all the way sober so I can stay off the drugs, go to AA meetings." Denies pain, physical problems.   A: Medicated per orders, no prns required or requested. Level III obs in place for safety. Emotional support offered and self inventory reviewed. Encouraged completion of Suicide Safety Plan and programming participation. Discussed POC with MD, SW.    R: Patient verbalizes understanding of POC. Patient denies SI/HI/AVH and remains safe on level III obs. Will continue to monitor closely and make verbal contact frequently. MD to eval readiness for discharge.

## 2016-12-05 NOTE — Progress Notes (Signed)
D: Pt was in the dayroom upon initial approach.  Pt presents with appropriate affect and mood.  Describes his day as "alright."  His goal is to "get up out of here and then go to AA meetings."  Pt denies SI/HI, denies hallucinations, complains of pain from muscle spasms of 6/10.  Pt has been visible in milieu interacting with peers and staff appropriately.  He is attention-seeking at times but easily redirectable.  Pt attended evening group.    A: Introduced self to pt.  Actively listened to pt and offered support and encouragement. Medications administered per order.  PRN medication administered for muscle spasms and sleep.  Q15 minute safety checks maintained.  R: Pt is safe on the unit.  Pt is compliant with medications.  Pt verbally contracts for safety.  Will continue to monitor and assess.

## 2016-12-05 NOTE — Progress Notes (Signed)
D: Pt was in the hallway upon initial approach.  Pt presents with appropriate affect and mood.  His goal is "eating, trying to get my mind off dope, I feel a lot better, my morale is up."  Pt denies SI/HI, denies hallucinations, denies pain.  Pt has been visible in milieu interacting with peers and staff appropriately.  Pt attended evening group.    A: Introduced self to pt.  Actively listened to pt and offered support and encouragement. Medication administered per order.  PRN medication administered for sleep and muscle spasms.  Q15 minute safety checks maintained.  R: Pt is safe on the unit.  Pt is compliant with medications.  Pt verbally contracts for safety.  Will continue to monitor and assess.

## 2016-12-05 NOTE — Plan of Care (Signed)
  Progressing Activity: Sleeping patterns will improve 12/05/2016 2336 - Progressing by Arrie Aranhurch, Theresea Trautmann J, RN Note Slept 6.75 hours last night according to flowsheet.

## 2016-12-05 NOTE — Progress Notes (Signed)
Recreation Therapy Notes  Date: 12/05/16 Time:  0930 Location: 300 Hall Dayroom  Group Topic: Stress Management  Goal Area(s) Addresses:  Patient will verbalize importance of using healthy stress management.  Patient will identify positive emotions associated with healthy stress management.   Intervention: Stress Management  Activity : Guided Imagery.  LRT introduced the stress management technique of guided imagery.  LRT read a script that allowed patients to envision listening to the waves at the beach.  Patients were to follow along as LRT read script.  Education:  Stress Management, Discharge Planning.   Education Outcome: Acknowledges edcuation/In group clarification offered/Needs additional education  Clinical Observations/Feedback: Pt did not attend group.   Billiejo Sorto, LRT/CTRS         Aydin Cavalieri A 12/05/2016 12:21 PM 

## 2016-12-05 NOTE — Tx Team (Signed)
Interdisciplinary Treatment and Diagnostic Plan Update  12/05/2016 Time of Session: 1478GN0830AM Logan Hancock MRN: [id number]  Principal Diagnosis: Heroin use disorder, severe ([facility])  Secondary Diagnoses: Principal Problem:   Heroin use disorder, severe ([facility]) Active Problems:   Severe recurrent major depression without psychotic features ([facility])   Current Medications:  Current Facility-Administered Medications  Medication Dose Route Frequency Provider Last Rate Last Dose  . acetaminophen (TYLENOL) tablet 650 mg  650 mg Oral Q6H PRN Nira ConnBerry, Jason A, NP      . albuterol (PROVENTIL HFA;VENTOLIN HFA) 108 (90 Base) MCG/ACT inhaler 2 puff  2 puff Inhalation Q6H PRN Money, Gerlene Burdockravis B, FNP      . alum & mag hydroxide-simeth (MAALOX/MYLANTA) 200-200-20 MG/5ML suspension 30 mL  30 mL Oral Q4H PRN Nira ConnBerry, Jason A, NP      . buPROPion (WELLBUTRIN XL) 24 hr tablet 150 mg  150 mg Oral Daily Oneta RackLewis, Tanika N, NP   150 mg at 12/05/16 0806  . cloNIDine (CATAPRES) tablet 0.1 mg  0.1 mg Oral QID Nira ConnBerry, Jason A, NP   0.1 mg at 12/05/16 [id number]   Followed by  . cloNIDine (CATAPRES) tablet 0.1 mg  0.1 mg Oral BH-qamhs Jackelyn PolingBerry, Jason A, NP       Followed by  . [START ON 12/08/2016] cloNIDine (CATAPRES) tablet 0.1 mg  0.1 mg Oral QAC breakfast Nira ConnBerry, Jason A, NP      . dicyclomine (BENTYL) tablet 20 mg  20 mg Oral Q6H PRN Nira ConnBerry, Jason A, NP   20 mg at 12/03/16 1836  . hydrOXYzine (ATARAX/VISTARIL) tablet 25 mg  25 mg Oral Q6H PRN Nira ConnBerry, Jason A, NP   25 mg at 12/03/16 2108  . loperamide (IMODIUM) capsule 2-4 mg  2-4 mg Oral PRN Nira ConnBerry, Jason A, NP      . magnesium hydroxide (MILK OF MAGNESIA) suspension 30 mL  30 mL Oral Daily PRN Nira ConnBerry, Jason A, NP      . methocarbamol (ROBAXIN) tablet 500 mg  500 mg Oral Q8H PRN Nira ConnBerry, Jason A, NP   500 mg at 12/04/16 2121  . naproxen (NAPROSYN) tablet 500 mg  500 mg Oral BID PRN Nira ConnBerry, Jason A, NP   500 mg at 12/03/16 1315  . nicotine (NICODERM CQ - dosed in mg/24 hours) patch 21 mg  21 mg  Transdermal Q24H Cobos, Rockey SituFernando A, MD   21 mg at 12/04/16 1833  . ondansetron (ZOFRAN-ODT) disintegrating tablet 4 mg  4 mg Oral Q6H PRN Nira ConnBerry, Jason A, NP      . traZODone (DESYREL) tablet 50 mg  50 mg Oral QHS PRN Jackelyn PolingBerry, Jason A, NP   50 mg at 12/04/16 2201   PTA Medications: Medications Prior to Admission  Medication Sig Dispense Refill Last Dose  . buprenorphine-naloxone (SUBOXONE) 8-2 mg SUBL SL tablet Place 1 tablet under the tongue once.   12/02/2016 at Unknown time  . omeprazole (PRILOSEC) 20 MG capsule Take 1 capsule (20 mg total) by mouth daily. (Patient not taking: Reported on 10/13/2016) 30 capsule 0 Completed Course at Unknown time  . predniSONE (DELTASONE) 20 MG tablet Take 3 tablets (60 mg total) by mouth daily. (Patient not taking: Reported on 10/13/2016) 6 tablet 0 Completed Course at Unknown time  . sucralfate (CARAFATE) 1 GM/10ML suspension Take 10 mLs (1 g total) by mouth 4 (four) times daily -  with meals and at bedtime. (Patient not taking: Reported on 10/13/2016) 420 mL 0 Completed Course at Unknown time    Patient Stressors:  Medication change or noncompliance Occupational concerns Substance abuse  Patient Strengths: Geographical information systems officer for treatment/growth  Treatment Modalities: Medication Management, Group therapy, Case management,  1 to 1 session with clinician, Psychoeducation, Recreational therapy.   Physician Treatment Plan for Primary Diagnosis: Heroin use disorder, severe (HCC) Long Term Goal(s): Improvement in symptoms so as ready for discharge Improvement in symptoms so as ready for discharge   Short Term Goals: Ability to identify changes in lifestyle to reduce recurrence of condition will improve Ability to verbalize feelings will improve Ability to disclose and discuss suicidal ideas Ability to demonstrate self-control will improve Ability to identify and develop effective coping behaviors will improve Ability to maintain clinical  measurements within normal limits will improve Ability to identify triggers associated with substance abuse/mental health issues will improve  Medication Management: Evaluate patient's response, side effects, and tolerance of medication regimen.  Therapeutic Interventions: 1 to 1 sessions, Unit Group sessions and Medication administration.  Evaluation of Outcomes: Progressing  Physician Treatment Plan for Secondary Diagnosis: Principal Problem:   Heroin use disorder, severe ([facility]) Active Problems:   Severe recurrent major depression without psychotic features ([facility])  Long Term Goal(s): Improvement in symptoms so as ready for discharge Improvement in symptoms so as ready for discharge   Short Term Goals: Ability to identify changes in lifestyle to reduce recurrence of condition will improve Ability to verbalize feelings will improve Ability to disclose and discuss suicidal ideas Ability to demonstrate self-control will improve Ability to identify and develop effective coping behaviors will improve Ability to maintain clinical measurements within normal limits will improve Ability to identify triggers associated with substance abuse/mental health issues will improve     Medication Management: Evaluate patient's response, side effects, and tolerance of medication regimen.  Therapeutic Interventions: 1 to 1 sessions, Unit Group sessions and Medication administration.  Evaluation of Outcomes: Progressing   RN Treatment Plan for Primary Diagnosis: Heroin use disorder, severe (HCC) Long Term Goal(s): Knowledge of disease and therapeutic regimen to maintain health will improve  Short Term Goals: Ability to remain free from injury will improve, Ability to disclose and discuss suicidal ideas and Ability to identify and develop effective coping behaviors will improve  Medication Management: RN will administer medications as ordered by provider, will assess and evaluate patient's response and  provide education to patient for prescribed medication. RN will report any adverse and/or side effects to prescribing provider.  Therapeutic Interventions: 1 on 1 counseling sessions, Psychoeducation, Medication administration, Evaluate responses to treatment, Monitor vital signs and CBGs as ordered, Perform/monitor CIWA, COWS, AIMS and Fall Risk screenings as ordered, Perform wound care treatments as ordered.  Evaluation of Outcomes: Progressing   LCSW Treatment Plan for Primary Diagnosis: Heroin use disorder, severe (HCC) Long Term Goal(s): Safe transition to appropriate next level of care at discharge, Engage patient in therapeutic group addressing interpersonal concerns.  Short Term Goals: Engage patient in aftercare planning with referrals and resources, Facilitate patient progression through stages of change regarding substance use diagnoses and concerns and Identify triggers associated with mental health/substance abuse issues  Therapeutic Interventions: Assess for all discharge needs, 1 to 1 time with Social worker, Explore available resources and support systems, Assess for adequacy in community support network, Educate family and significant other(s) on suicide prevention, Complete Psychosocial Assessment, Interpersonal group therapy.  Evaluation of Outcomes: Progressing   Progress in Treatment: Attending groups: Yes. Participating in groups: Yes. Taking medication as prescribed: Yes. Toleration medication: Yes. Family/Significant other contact made: Yes, individual(s)  contacted:  pt's mother.  Patient understands diagnosis: Yes. Discussing patient identified problems/goals with staff: Yes. Medical problems stabilized or resolved: Yes. Denies suicidal/homicidal ideation: Yes. Issues/concerns per patient self-inventory: No. Other: n/a   New problem(s) identified: No, Describe:  n/a  New Short Term/Long Term Goal(s): detox/medication management for mood stabilization;  elimination of SI thoughts; development of comprehensive mental wellness/sobriety plan.   Discharge Plan or Barriers: CSW assessing for appropriate referrals. Possibly [facility] for outpatient mental health care.   Reason for Continuation of Hospitalization: Anxiety Depression Medication stabilization Suicidal ideation Withdrawal symptoms  Estimated Length of Stay: Wed, 12/07/16  Attendees: Patient: 12/05/2016 9:11 AM  Physician: Dr. Jackquline BerlinIzediuno MD; Dr. Altamese Carolinaainville MD 12/05/2016 9:11 AM  Nursing: Armando ReichertMarian RN; Brumleyaroline RN 12/05/2016 9:11 AM  RN Care Manager:x 12/05/2016 9:11 AM  Social Worker: Chartered loss adjusterHeather Smart, LCSW 12/05/2016 9:11 AM  Recreational Therapist: x 12/05/2016 9:11 AM  Other: Armandina StammerAgnes Nwoko NP 12/05/2016 9:11 AM  Other:  12/05/2016 9:11 AM  Other: 12/05/2016 9:11 AM    Scribe for Treatment Team: Ledell PeoplesHeather N Smart, LCSW 12/05/2016 9:11 AM

## 2016-12-05 NOTE — Plan of Care (Signed)
Patient verbalizes understanding of information, education provided. 

## 2016-12-06 DIAGNOSIS — G4700 Insomnia, unspecified: Secondary | ICD-10-CM

## 2016-12-06 DIAGNOSIS — F39 Unspecified mood [affective] disorder: Secondary | ICD-10-CM

## 2016-12-06 LAB — HEPATITIS B CORE ANTIBODY, IGM: HEP B C IGM: NEGATIVE

## 2016-12-06 LAB — HEPATITIS B SURFACE ANTIBODY,QUALITATIVE: Hep B S Ab: NONREACTIVE

## 2016-12-06 LAB — HEPATITIS C ANTIBODY

## 2016-12-06 LAB — HEPATITIS B SURFACE ANTIGEN: HEP B S AG: NEGATIVE

## 2016-12-06 MED ORDER — BUPROPION HCL ER (XL) 300 MG PO TB24
300.0000 mg | ORAL_TABLET | Freq: Every day | ORAL | Status: DC
  Administered 2016-12-06 – 2016-12-07 (×2): 300 mg via ORAL
  Filled 2016-12-06: qty 7
  Filled 2016-12-06 (×4): qty 1

## 2016-12-06 NOTE — Plan of Care (Signed)
Pt remains a low fall risk, denies SI at this time.  

## 2016-12-06 NOTE — Progress Notes (Signed)
[facility] MD Progress Note  12/06/2016 12:35 PM Logan Hancock  MRN:  [id number]   Subjective:  Patient reports that he refused his Wellbutrin this morning because he doesn't feel like he needs it. He then admits that he has dealt with depression and anxiety before the drug use. He also states that the medication has helped. He states that he is done with drug use and he has a lot of "burned bridges to rebuild." He realizes then how difficult and possibly disappointing is can be to gain trust again and then agrees to continue the medication. He denies any SI/HI/AVH and contracts for safety.  Objective: Patient's chart and findings reviewed and discussed with treatment team. Patient is pleasant and cooperative. He agreed to continue the medication and have it increased since he has complaint of depression and anxiety before the drug use and the stressors of rebuilding "burned bridges" with family members.  Principal Problem: Heroin use disorder, severe ([place]) Diagnosis:   Patient Active Problem List   Diagnosis Date Noted  . Severe recurrent major depression without psychotic features ([place]) [F33.2] 12/03/2016  . Heroin use disorder, severe ([place]) [F11.20] 12/03/2016  . Substance induced mood disorder ([place]) [F19.94] 10/14/2016  . Polysubstance abuse ([place]) [F19.10] 10/14/2016  . Suicidal ideation [R45.851]    Total Time spent with patient: 25 minutes  Past Psychiatric History: See  H&P  Past Medical History:  Past Medical History:  Diagnosis Date  . Asthma    History reviewed. No pertinent surgical history. Family History: History reviewed. No pertinent family history. Family Psychiatric  History: See H&P Social History:  Social History   Substance and Sexual Activity  Alcohol Use No  . Frequency: Never     Social History   Substance and Sexual Activity  Drug Use Yes  . Frequency: 3.0 times per week  . Types: Marijuana, Cocaine   Comment: Heroin    Social History   Socioeconomic  History  . Marital status: Single    Spouse name: None  . Number of children: None  . Years of education: None  . Highest education level: None  Social Needs  . Financial resource strain: None  . Food insecurity - worry: None  . Food insecurity - inability: None  . Transportation needs - medical: None  . Transportation needs - non-medical: None  Occupational History  . None  Tobacco Use  . Smoking status: Current Every Day Smoker    Packs/day: 0.50    Years: 2.00    Pack years: 1.00    Types: Cigarettes  . Smokeless tobacco: Never Used  Substance and Sexual Activity  . Alcohol use: No    Frequency: Never  . Drug use: Yes    Frequency: 3.0 times per week    Types: Marijuana, Cocaine    Comment: Heroin  . Sexual activity: Yes    Birth control/protection: None  Other Topics Concern  . None  Social History Narrative  . None   Additional Social History:    Pain Medications: see MAR Prescriptions: see MAR Over the Counter: see MAR History of alcohol / drug use?: Yes Negative Consequences of Use: Financial, Personal relationships Withdrawal Symptoms: Irritability, Fever / Chills, Tremors Name of Substance 1: heroin 1 - Age of First Use: 14 or 15 1 - Amount (size/oz): 1 gram a day 1 - Frequency: daily 1 - Duration: years 1 - Last Use / Amount: 11/27/16 Name of Substance 2: Cocaine (snorting) 2 - Age of First Use: 15 2 -  Amount (size/oz): $30 worth 2 - Frequency: 2-3 times per week 2 - Duration: Ongoing 2 - Last Use / Amount: 11/29/16                Sleep: Good  Appetite:  Good  Current Medications: Current Facility-Administered Medications  Medication Dose Route Frequency Provider Last Rate Last Dose  . acetaminophen (TYLENOL) tablet 650 mg  650 mg Oral Q6H PRN Lindon Romp A, NP      . albuterol (PROVENTIL HFA;VENTOLIN HFA) 108 (90 Base) MCG/ACT inhaler 2 puff  2 puff Inhalation Q6H PRN Money, Lowry Ram, FNP      . alum & mag hydroxide-simeth  (MAALOX/MYLANTA) 200-200-20 MG/5ML suspension 30 mL  30 mL Oral Q4H PRN Lindon Romp A, NP      . buPROPion (WELLBUTRIN XL) 24 hr tablet 300 mg  300 mg Oral Daily Money, Travis B, FNP   300 mg at 12/06/16 1151  . cloNIDine (CATAPRES) tablet 0.1 mg  0.1 mg Oral BH-qamhs Lindon Romp A, NP   0.1 mg at 12/06/16 3785   Followed by  . [START ON 12/08/2016] cloNIDine (CATAPRES) tablet 0.1 mg  0.1 mg Oral QAC breakfast Lindon Romp A, NP      . dicyclomine (BENTYL) tablet 20 mg  20 mg Oral Q6H PRN Lindon Romp A, NP   20 mg at 12/03/16 1836  . hydrOXYzine (ATARAX/VISTARIL) tablet 25 mg  25 mg Oral Q6H PRN Lindon Romp A, NP   25 mg at 12/03/16 2108  . loperamide (IMODIUM) capsule 2-4 mg  2-4 mg Oral PRN Lindon Romp A, NP      . magnesium hydroxide (MILK OF MAGNESIA) suspension 30 mL  30 mL Oral Daily PRN Lindon Romp A, NP      . methocarbamol (ROBAXIN) tablet 500 mg  500 mg Oral Q8H PRN Lindon Romp A, NP   500 mg at 12/05/16 2132  . naproxen (NAPROSYN) tablet 500 mg  500 mg Oral BID PRN Lindon Romp A, NP   500 mg at 12/03/16 1315  . nicotine (NICODERM CQ - dosed in mg/24 hours) patch 21 mg  21 mg Transdermal Daily Izediuno, Laruth Bouchard, MD   21 mg at 12/06/16 0903  . ondansetron (ZOFRAN-ODT) disintegrating tablet 4 mg  4 mg Oral Q6H PRN Lindon Romp A, NP      . traZODone (DESYREL) tablet 50 mg  50 mg Oral QHS PRN Rozetta Nunnery, NP   50 mg at 12/05/16 2132    Lab Results:  Results for orders placed or performed during the hospital encounter of 12/03/16 (from the past 48 hour(s))  Hepatitis B surface antibody     Status: None   Collection Time: 12/04/16  6:40 PM  Result Value Ref Range   Hep B S Ab Non Reactive     Comment: (NOTE)              Non Reactive: Inconsistent with immunity,                            less than 10 mIU/mL              Reactive:     Consistent with immunity,                            greater than 9.9 mIU/mL Performed At: [facility] [address]  [place], [place] [id number]  Rush Farmer MD JM:[id number] Performed at [facility], [place] [address], [place], [place] [address]   Hepatitis B surface antigen     Status: None   Collection Time: 12/04/16  6:40 PM  Result Value Ref Range   Hepatitis B Surface Ag Negative Negative    Comment: (NOTE) Performed At: [facility] [place], [place] [id number] Rush Farmer MD EY:[id number] Performed at [facility], [place] [address], [place], [place] [address]   Hepatitis B core antibody, IgM     Status: None   Collection Time: 12/04/16  6:40 PM  Result Value Ref Range   Hep B C IgM Negative Negative    Comment: (NOTE) Performed At: [facility] [address] [place], [place] [id number] Rush Farmer MD [id number] Performed at [facility], [place] [address], [place], [place] [address]   Comprehensive metabolic panel     Status: Abnormal   Collection Time: 12/04/16  6:40 PM  Result Value Ref Range   Sodium 138 135 - 145 mmol/L   Potassium 4.1 3.5 - 5.1 mmol/L   Chloride 107 101 - 111 mmol/L   CO2 23 22 - 32 mmol/L   Glucose, Bld 87 65 - 99 mg/dL   BUN 9 6 - 20 mg/dL   Creatinine, Ser 1.02 0.61 - 1.24 mg/dL   Calcium 8.6 (L) 8.9 - 10.3 mg/dL   Total Protein 6.6 6.5 - 8.1 g/dL   Albumin 3.6 3.5 - 5.0 g/dL   AST 122 (H) 15 - 41 U/L   ALT 232 (H) 17 - 63 U/L   Alkaline Phosphatase 92 38 - 126 U/L   Total Bilirubin 0.7 0.3 - 1.2 mg/dL   GFR calc non Af Amer >60 >60 mL/min   GFR calc Af Amer >60 >60 mL/min    Comment: (NOTE) The eGFR has been calculated using the CKD EPI equation. This calculation has not been validated in all clinical situations. eGFR's persistently <60 mL/min signify possible Chronic Kidney Disease.    Anion gap 8 5 - 15    Comment: Performed at [facility], [place] [address], [place], [place] [address]  HIV antibody     Status: None   Collection Time:  12/04/16  6:40 PM  Result Value Ref Range   HIV Screen 4th Generation wRfx Non Reactive Non Reactive    Comment: (NOTE) Performed At: [facility] [place], [place] [id number] Rush Farmer MD [id number] Performed at [facility], [place] [address], [place], [place] [address]   Hepatitis C antibody     Status: Abnormal   Collection Time: 12/04/16  6:40 PM  Result Value Ref Range   HCV Ab >11.0 (H) 0.0 - 0.9 s/co ratio    Comment: (NOTE)                                  Negative:     < 0.8                             Indeterminate: 0.8 - 0.9                                  Positive:     > 0.9 The CDC recommends that a positive HCV antibody  result be followed up with a HCV Nucleic Acid Amplification test (710626). Performed At: [facility] [place], [place] [id number] Rush Farmer MD [id number] Performed at [facility], [place] [address], [place], [place] [address]     Blood Alcohol level:  Lab Results  Component Value Date   ETH <10 12/02/2016   ETH <10 67/89/3810    Metabolic Disorder Labs: No results found for: HGBA1C, MPG No results found for: PROLACTIN No results found for: CHOL, TRIG, HDL, CHOLHDL, VLDL, LDLCALC  Physical Findings: AIMS: Facial and Oral Movements Muscles of Facial Expression: None, normal Lips and Perioral Area: None, normal Jaw: None, normal Tongue: None, normal,Extremity Movements Upper (arms, wrists, hands, fingers): None, normal Lower (legs, knees, ankles, toes): None, normal, Trunk Movements Neck, shoulders, hips: None, normal, Overall Severity Severity of abnormal movements (highest score from questions above): None, normal Incapacitation due to abnormal movements: None, normal Patient's awareness of abnormal movements (rate only patient's report): No Awareness, Dental Status Current problems with teeth and/or dentures?: No Does patient usually wear  dentures?: No  CIWA:  CIWA-Ar Total: 1 COWS:  COWS Total Score: 0  Musculoskeletal: Strength & Muscle Tone: within normal limits Gait & Station: normal Patient leans: N/A  Psychiatric Specialty Exam: Physical Exam  Nursing note and vitals reviewed. Constitutional: He is oriented to person, place, and time. He appears well-developed and well-nourished.  Cardiovascular: Normal rate.  Respiratory: Effort normal.  Musculoskeletal: Normal range of motion.  Neurological: He is alert and oriented to person, place, and time.  Skin: Skin is warm.    Review of Systems  Constitutional: Negative.   HENT: Negative.   Eyes: Negative.   Respiratory: Negative.   Cardiovascular: Negative.   Gastrointestinal: Negative.   Genitourinary: Negative.   Musculoskeletal: Negative.   Skin: Negative.   Neurological: Negative.   Endo/Heme/Allergies: Negative.   Psychiatric/Behavioral: Negative for hallucinations and suicidal ideas.    Blood pressure (!) 105/57, pulse 78, temperature 98.6 F (37 C), temperature source Oral, resp. rate 18, height '6\' 4"'  (1.93 m), weight 106.6 kg (235 lb).Body mass index is 28.61 kg/m.  General Appearance: Casual  Eye Contact:  Good  Speech:  Clear and Coherent and Normal Rate  Volume:  Normal  Mood:  Euthymic  Affect:  Congruent  Thought Process:  Goal Directed and Descriptions of Associations: Intact  Orientation:  Full (Time, Place, and Person)  Thought Content:  WDL  Suicidal Thoughts:  No  Homicidal Thoughts:  No  Memory:  Immediate;   Good Recent;   Good Remote;   Good  Judgement:  Fair  Insight:  Fair  Psychomotor Activity:  Normal  Concentration:  Concentration: Good and Attention Span: Good  Recall:  Good  Fund of Knowledge:  Good  Language:  Good  Akathisia:  No  Handed:  Right  AIMS (if indicated):     Assets:  Communication Skills Desire for Improvement Financial Resources/Insurance Housing Physical Health Social Support Transportation   ADL's:  Intact  Cognition:  WNL  Sleep:  Number of Hours: 6.75   Problems Addressed: MDD severe Heroin abuse  Treatment Plan Summary: Daily contact with patient to assess and evaluate symptoms and progress in treatment, Medication management and Plan is to:  -Increase Wellbutrin XL 300 mg PO Daily for mood stability -Continue Clonidine Protocol -Continue Vistaril 25 mg Q6H PRN for anxiety -Continue Trazodone 50 mg PO QHS PRN for insomnia -Encourage group therapy participation -Possible discharge tomorrow  Lewis Shock, FNP  12/06/2016, 12:35 PM   Agree with NP Progress Note

## 2016-12-06 NOTE — [facility] Suicide Risk Assessment (Signed)
[facility] INPATIENT:  Family/Significant Other Suicide Prevention Education  Suicide Prevention Education:  Education Completed; Logan Hancock (pt's mother) [phone] has been identified by the patient as the family member/significant other with whom the patient will be residing, and identified as the person(s) who will aid the patient in the event of a mental health crisis (suicidal ideations/suicide attempt).  With written consent from the patient, the family member/significant other has been provided the following suicide prevention education, prior to the and/or following the discharge of the patient.  The suicide prevention education provided includes the following:  Suicide risk factors  Suicide prevention and interventions  National Suicide Hotline telephone number  [facility] assessment telephone number  [facility] Emergency Assistance 911  [facility] and/or Residential Mobile Crisis Unit telephone number  Request made of family/significant other to:  Remove weapons (e.g., guns, rifles, knives), all items previously/currently identified as safety concern.    Remove drugs/medications (over-the-counter, prescriptions, illicit drugs), all items previously/currently identified as a safety concern.  The family member/significant other verbalizes understanding of the suicide prevention education information provided.  The family member/significant other agrees to remove the items of safety concern listed above.  Pt's mother shared that pt sounds "a bit better on the phone." She has concerns that pt will return to using drugs at discharge and that "nothing will change." She understands that he must be willing to make a change and motivated to carry it out. She is aware of upcoming [facility] appt and resources like MHAG and AA/NA that have been provided to him and plans to encourage him to attend. She has no safety concerns regarding his discharge and states that he does  not have access to guns/firearms.   Jalana Moore N Smart LCSW 12/06/2016, 9:59 AM

## 2016-12-06 NOTE — Progress Notes (Signed)
Patient attended AA group meeting tonight.  

## 2016-12-06 NOTE — Progress Notes (Signed)
  DATA ACTION RESPONSE  Objective- Pt. is visible in the dayroom, seen interacting with peers. Presents with an animated/worried affect and mood. Pt is hoping to be d/c tomorrow but he is unsure of where.  Subjective- Denies having any SI/HI/AVH at this time. Rates pain 7/10; mid back. Is cooperative and remains safe on the unit.  1:1 interaction in private to establish rapport. Encouragement, education, & support given from staff.  PRN vistaril, naproxen, robaxin, and trazodone requested and will re-eval accordingly.   Safety maintained with Q 15 checks. Continue with POC.

## 2016-12-06 NOTE — Progress Notes (Signed)
D: Patient alert and oriented. Affect/mood: Pleasant, cooperative. Denies SI, HI, AVH at this time. Denies pain. Goal: "To keep going to AA meetings and stay sober". Initially refused Wellbutrin reporting that he doesn't need it however agreed to take medications as order after talking to physician. Reports to this writer that he feels he is ready to go home, reports that he wants to repair previously damaged relationships with loved ones. Patient has been observed interacting appropriately with staff and others on the unit today. Rates depression and hopelessness "0", and anxiety "5". Denies concerns at this time. Patient approaches this Clinical research associatewriter several times throughout the day offering to teach to play spades card game.  A: Scheduled medications administered to patient per MD order. Support and encouragement provided. Routine safety checks conducted every 15 minutes. Patient informed to notify staff with problems or concerns.   R: No adverse drug reactions noted. Patient contracts for safety at this time. Patient compliant with medications and treatment plan. Patient receptive, calm, and cooperative. Patient interacts well with others on the unit. Patient remains safe at this time.

## 2016-12-06 NOTE — Progress Notes (Signed)
Recreation Therapy Notes  Animal-Assisted Activity (AAA) Program Checklist/Progress Notes Patient Eligibility Criteria Checklist & Daily Group note for Rec TxIntervention  Date: 11.27.2018 Time: 2:45pm Location: Adult Unit  AAA/T Program Assumption of Risk Form signed by Patient/ or Parent Legal Guardian Yes  Patient is free of allergies or sever asthma Yes  Patient reports no fear of animals Yes  Patient reports no history of cruelty to animals Yes  Patient understands his/her participation is voluntary Yes  Behavioral Response: Did not attend.   Tenika Keeran L Kahliyah Dick, LRT/CTRS        Chanda Laperle L 12/06/2016 3:31 PM 

## 2016-12-07 MED ORDER — HYDROXYZINE HCL 25 MG PO TABS: 25.0000 mg | ORAL_TABLET | Freq: Four times a day (QID) | ORAL | 0 refills | Status: DC | PRN

## 2016-12-07 MED ORDER — TRAZODONE HCL 50 MG PO TABS: 50.0000 mg | ORAL_TABLET | Freq: Every evening | ORAL | 0 refills | Status: DC | PRN

## 2016-12-07 MED ORDER — BUPROPION HCL ER (XL) 300 MG PO TB24: 300.0000 mg | ORAL_TABLET | Freq: Every day | ORAL | 0 refills | Status: DC
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
## 2016-12-07 NOTE — [facility] Suicide Risk Assessment (Signed)
[facility]   Principal Problem: Heroin use disorder, severe [facility](HCC) Discharge Diagnoses:  Patient Active Problem List   Diagnosis Date Noted  . Severe recurrent major depression without psychotic features (HCC) [F33.2] 12/03/2016  . Heroin use disorder, severe (HCC) [F11.20] 12/03/2016  . Substance induced mood disorder (HCC) [F19.94] 10/14/2016  . Polysubstance abuse (HCC) [F19.10] 10/14/2016  . Suicidal ideation [R45.851]     Total Time spent with patient: 30 minutes  Musculoskeletal: Strength & Muscle Tone: within normal limits Gait & Station: normal Patient leans: N/A  Psychiatric Specialty Exam: ROS no headache, no chest pain, no shortness of breath, no vomiting, no diarrhea, no fever   Blood pressure (!) 148/67, pulse 75, temperature (!) 97.4 F (36.3 C), temperature source Oral, resp. rate 16, height 6\' 4"  (1.93 m), weight 106.6 kg (235 lb).Body mass index is 28.61 kg/m.  General Appearance: Well Groomed  Eye Contact::  Good  Speech:  Normal Rate409  Volume:  Normal  Mood:  improved, denies feeling depressed   Affect:  Appropriate and reactive   Thought Process:  Linear and Descriptions of Associations: Intact  Orientation:  Full (Time, Place, and Person)  Thought Content:  denies hallucinations, no delusions, not internally preoccupied   Suicidal Thoughts:  No denies any suicidal or self injurious ideations, denies homicidal or violent ideations  Homicidal Thoughts:  No  Memory:  recent and remote grossly intact   Judgement:  Other:  fair- improving   Insight:  fair- improving  Psychomotor Activity:  Normal  Concentration:  Good  Recall:  Good  Fund of Knowledge:Good  Language: Good  Akathisia:  Negative  Handed:  Right  AIMS (if indicated):     Assets:  Communication Skills Desire for Improvement Resilience  Sleep:  Number of Hours: 5.75  Cognition: WNL  ADL's:  Intact   Mental Status Per Nursing Assessment::   On Admission:      Demographic Factors:  26 year single male, no children, currently homeless   Loss Factors: Substance abuse , states " I was feeling tired of using drugs "  Historical Factors: History of Opiate Dependence. No prior psychiatric admissions, history of prior suicide attempt.   Risk Reduction Factors:   Sense of responsibility to family and Positive coping skills or problem solving skills  Continued Clinical Symptoms:  At this time patient is alert, attentive, well related, calm, mood is reported as improved , and affect is appropriate, reactive, no thought disorder, no suicidal ideations, no self injurious ideations, no homicidal ideations, no psychotic symptoms, future oriented .  States he is feeling " much better than when I came in". Denies significant withdrawal symptoms at this time and does not appear to be in any acute distress . Denies medication side effects. We reviewed side effects.  Behavior in good control, presents pleasant on approach.  Cognitive Features That Contribute To Risk:  No gross cognitive deficits noted upon discharge. Is alert , attentive, and oriented x 3   Suicide Risk:  Mild:  Suicidal ideation of limited frequency, intensity, duration, and specificity.  There are no identifiable plans, no associated intent, mild dysphoria and related symptoms, good self-control (both objective and subjective assessment), few other risk factors, and identifiable protective factors, including available and accessible social support.  Follow-up Information    [facility] Follow up on 12/13/2016.   Specialty:  Behavioral Health Why:  Hospital follow-up/assessment for mental health services on this date at 8:15AM. Please bring: photo ID and hospital discharge  paperwork. Thank you.  Contact information: [address] [place] [address] [phone]           Plan Of Care/Follow-up recommendations:  Activity:  as tolerated  Diet:  regular Tests:  NA Other:  See  below  Patient is expressing readiness for discharge and is leaving unit in good spirits  Plans to follow up as above and plans to go to NA meetings regularly  We discussed importance of avoiding people, places and things associates with drug use in order to decrease triggers for relapse and importance of following up with PCP regarding management of Hep C (+) status.  Craige CottaFernando A Zephyr Sausedo, MD 12/07/2016, 11:19 AM

## 2016-12-07 NOTE — Progress Notes (Signed)
Recreation Therapy Notes  Date: 12/07/16 Time: 0930 Location: [address] Dayroom  Group Topic: Stress Management  Goal Area(s) Addresses:  Patient will verbalize importance of using healthy stress management.  Patient will identify positive emotions associated with healthy stress management.   Intervention: Stress Management  Activity :  [employer] Meditation.  LRT introduced the stress management technique of meditation.  Patients were to listen and follow along as LRT played meditation from the Calm app.  Education:  Stress Management, Discharge Planning.   Education Outcome: Acknowledges edcuation/In group clarification offered/Needs additional education  Clinical Observations/Feedback: Pt did not attend group.    Caroll RancherMarjette Dawnetta Copenhaver, LRT/CTRS         Lillia AbedLindsay, Lakeia Bradshaw A 12/07/2016 1:00 PM

## 2016-12-07 NOTE — Progress Notes (Addendum)
  [facility] :  Will you be returning to the same living situation after discharge:  Yes,  home At discharge, do you have transportation home?: Yes,  family member Do you have the ability to pay for your medications: Yes,  mental health  Release of information consent forms completed and submitted to medical records by CSW.  Patient to Follow up at: Follow-up Information    [facility] Follow up on 12/13/2016.   Specialty:  Behavioral Health Why:  Hospital follow-up/assessment for mental health services on this date at 8:15AM. Please bring: photo ID and hospital discharge paperwork. Thank you.  Contact information: [address] [place] [address] [phone]           Next level of care provider [facility] Link:no  Safety Planning and Suicide Prevention discussed: Yes,  SPE completed with pt's mother. SPI pamphlet and Mobile Crisis information provided to pt.   Have you used any form of tobacco in the last 30 days? (Cigarettes, Smokeless Tobacco, Cigars, and/or Pipes): Yes  Has patient been referred to the Quitline?: Patient refused referral  Patient has been referred for addiction treatment: Yes--pt also provided with [facility] information per his request, although he DID NOT want referral made from within the hospital.   Ledell PeoplesHeather N Smart, LCSW 12/07/2016, 10:08 AM

## 2016-12-07 NOTE — Plan of Care (Signed)
Patient's care plan updated due discharge today.

## 2016-12-07 NOTE — Progress Notes (Signed)
Discharge note:  Patient discharged home per MD order.  He received all personal belongings from unit and locker.  Reviewed AVS/transition record with patient and he indicated understanding.  Patient denies any thoughts of self harm.  Patient received prescriptions and medication samples.  Patient left ambulatory with bus passes for transportation.

## 2017-05-05 ENCOUNTER — Other Ambulatory Visit: Payer: Self-pay

## 2017-05-05 ENCOUNTER — Emergency Department (HOSPITAL_COMMUNITY)
Admission: EM | Admit: 2017-05-05 | Discharge: 2017-05-05 | Disposition: A | Discharge status: Home / Self Care | Payer: Self-pay | Attending: Emergency Medicine | Admitting: Emergency Medicine

## 2017-05-05 ENCOUNTER — Encounter (HOSPITAL_COMMUNITY): Payer: Self-pay | Admitting: *Deleted

## 2017-05-05 DIAGNOSIS — Z79899 Other long term (current) drug therapy: Secondary | ICD-10-CM | POA: Insufficient documentation

## 2017-05-05 DIAGNOSIS — Z760 Encounter for issue of repeat prescription: Secondary | ICD-10-CM | POA: Insufficient documentation

## 2017-05-05 DIAGNOSIS — J45909 Unspecified asthma, uncomplicated: Secondary | ICD-10-CM | POA: Insufficient documentation

## 2017-05-05 DIAGNOSIS — F17210 Nicotine dependence, cigarettes, uncomplicated: Secondary | ICD-10-CM | POA: Insufficient documentation

## 2017-05-05 MED ORDER — ALBUTEROL SULFATE HFA 108 (90 BASE) MCG/ACT IN AERS
2.0000 | INHALATION_SPRAY | Freq: Once | RESPIRATORY_TRACT | Status: AC
  Administered 2017-05-05: 2 via RESPIRATORY_TRACT
  Filled 2017-05-05: qty 6.7

## 2017-05-05 MED ORDER — ALBUTEROL SULFATE HFA 108 (90 BASE) MCG/ACT IN AERS: 1.0000 | INHALATION_SPRAY | Freq: Four times a day (QID) | RESPIRATORY_TRACT | 0 refills | Status: DC | PRN

## 2017-05-05 NOTE — ED Provider Notes (Signed)
[name] Provider Note   CSN: [id number] Arrival date & time: 05/05/17  1103     History   Chief Complaint Chief Complaint  Patient presents with  . Medication Refill    HPI Basilia Jumboyler K Dykman is a 27 y.o. male with a history of asthma, polysubstance abuse presents the emergency department today for medication refill.  Patient states that he has been accepted to outpatient facility and has been told that he needs a prescription for his albuterol in order to get in.  Patient denies any current symptoms.  He denies any chest pain, shortness of breath, cough, URI symptoms or fever.  He denies any SI/HI. No alcohol or drug use.  No visual or auditory hallucinations.  HPI  Past Medical History:  Diagnosis Date  . Asthma     Patient Active Problem List   Diagnosis Date Noted  . Severe recurrent major depression without psychotic features (HCC) 12/03/2016  . Heroin use disorder, severe (HCC) 12/03/2016  . Substance induced mood disorder (HCC) 10/14/2016  . Polysubstance abuse (HCC) 10/14/2016  . Suicidal ideation     History reviewed. No pertinent surgical history.      Home Medications    Prior to Admission medications   Medication Sig Start Date End Date Taking? Authorizing Provider  buPROPion (WELLBUTRIN XL) 300 MG 24 hr tablet Take 1 tablet (300 mg total) by mouth daily. For mood control 12/08/16   Money, Feliz Beamravis B, FNP  hydrOXYzine (ATARAX/VISTARIL) 25 MG tablet Take 1 tablet (25 mg total) by mouth every 6 (six) hours as needed for anxiety. 12/07/16   Money, Gerlene Burdockravis B, FNP  traZODone (DESYREL) 50 MG tablet Take 1 tablet (50 mg total) by mouth at bedtime as needed for sleep. 12/07/16   Money, Gerlene Burdockravis B, FNP    Family History No family history on file.  Social History Social History   Tobacco Use  . Smoking status: Current Every Day Smoker    Packs/day: 0.50    Years: 2.00    Pack years: 1.00    Types: Cigarettes  . Smokeless  tobacco: Never Used  Substance Use Topics  . Alcohol use: No    Frequency: Never  . Drug use: Yes    Frequency: 3.0 times per week    Types: Marijuana, Cocaine    Comment: Heroin reports last use 5 mths ago     Allergies   Patient has no known allergies.   Review of Systems Review of Systems  All other systems reviewed and are negative.    Physical Exam Updated Vital Signs BP 136/87 (BP Location: Right Arm)   Pulse 97   Temp 98.2 F (36.8 C) (Oral)   Resp 14   Ht 6\' 3"  (1.905 m)   Wt 102.1 kg (225 lb)   SpO2 98%   BMI 28.12 kg/m   Physical Exam  Constitutional: He appears well-developed and well-nourished.  HENT:  Head: Normocephalic and atraumatic.  Right Ear: External ear normal.  Left Ear: External ear normal.  Nose: Nose normal.  Mouth/Throat: Uvula is midline, oropharynx is clear and moist and mucous membranes are normal. No tonsillar exudate.  Eyes: Pupils are equal, round, and reactive to light. Right eye exhibits no discharge. Left eye exhibits no discharge. No scleral icterus.  Neck: Trachea normal. Neck supple. No spinous process tenderness present. No neck rigidity. Normal range of motion present.  Cardiovascular: Normal rate, regular rhythm and intact distal pulses.  No murmur heard. Pulses:  Radial pulses are 2+ on the right side, and 2+ on the left side.       Dorsalis pedis pulses are 2+ on the right side, and 2+ on the left side.       Posterior tibial pulses are 2+ on the right side, and 2+ on the left side.  No lower extremity swelling or edema. Calves symmetric in size bilaterally.  Pulmonary/Chest: Effort normal and breath sounds normal. He exhibits no tenderness.  No increased work of breathing. No accessory muscle use. Patient is sitting upright, speaking in full sentences without difficulty   Abdominal: Soft. Bowel sounds are normal. There is no tenderness. There is no rebound and no guarding.  Musculoskeletal: He exhibits no edema.    Lymphadenopathy:    He has no cervical adenopathy.  Neurological: He is alert.  Skin: Skin is warm and dry. No rash noted. He is not diaphoretic.  Psychiatric: He has a normal mood and affect.  Nursing note and vitals reviewed.    ED Treatments / Results  Labs (all labs ordered are listed, but only abnormal results are displayed) Labs Reviewed - No data to display  EKG None  Radiology No results found.  Procedures Procedures (including critical care time)  Medications Ordered in ED Medications  albuterol (PROVENTIL HFA;VENTOLIN HFA) 108 (90 Base) MCG/ACT inhaler 2 puff (has no administration in time range)     Initial Impression / Assessment and Plan / ED Course  I have reviewed the triage vital signs and the nursing notes.  Pertinent labs & imaging results that were available during my care of the patient were reviewed by me and considered in my medical decision making (see chart for details).     27 year old male with a history of asthma presenting for medication refill.  He states that he has been accepted today [facility] and needs a refill prescription in order to get in.  Patient denies any current symptoms.  His lungs are clear to auscultation bilaterally.  Vital signs are reassuring.  He denies any current SI/HI, alcohol/drug use or hallucinations.  Feel comfortable refilling patient's prescription and providing albuterol in the department.  Return precautions discussed.  Patient appears safe for discharge.  Final Clinical Impressions(s) / ED Diagnoses   Final diagnoses:  Medication refill    ED Discharge Orders        Ordered    albuterol (PROVENTIL HFA;VENTOLIN HFA) 108 (90 Base) MCG/ACT inhaler  Every 6 hours PRN     05/05/17 1522       Princella Pellegrini 05/05/17 1523    Vanetta Mulders, MD 05/07/17 2031

## 2017-05-05 NOTE — ED Triage Notes (Signed)
Pt here requesting an inhaler and inhaler refill [facility], denies any other medical clearance, pt reports using Heroin since the age of 27 and has not used in 5 mths

## 2017-05-05 NOTE — Discharge Instructions (Signed)
Follow up with PCP for further management of your asthma and future refills.  If you develop worsening or new concerning symptoms you can return to the emergency department for re-evaluation.

## 2017-05-16 ENCOUNTER — Emergency Department (HOSPITAL_BASED_OUTPATIENT_CLINIC_OR_DEPARTMENT_OTHER)
Admission: EM | Admit: 2017-05-16 | Discharge: 2017-05-16 | Disposition: A | Discharge status: Home / Self Care | Payer: Self-pay | Attending: Emergency Medicine | Admitting: Emergency Medicine

## 2017-05-16 ENCOUNTER — Encounter (HOSPITAL_BASED_OUTPATIENT_CLINIC_OR_DEPARTMENT_OTHER): Payer: Self-pay | Admitting: Emergency Medicine

## 2017-05-16 DIAGNOSIS — L0291 Cutaneous abscess, unspecified: Secondary | ICD-10-CM

## 2017-05-16 DIAGNOSIS — F17210 Nicotine dependence, cigarettes, uncomplicated: Secondary | ICD-10-CM | POA: Insufficient documentation

## 2017-05-16 DIAGNOSIS — L02416 Cutaneous abscess of left lower limb: Secondary | ICD-10-CM | POA: Insufficient documentation

## 2017-05-16 DIAGNOSIS — J45909 Unspecified asthma, uncomplicated: Secondary | ICD-10-CM | POA: Insufficient documentation

## 2017-05-16 DIAGNOSIS — Z8614 Personal history of Methicillin resistant Staphylococcus aureus infection: Secondary | ICD-10-CM | POA: Insufficient documentation

## 2017-05-16 HISTORY — DX: Other psychoactive substance dependence, uncomplicated: F19.20

## 2017-05-16 HISTORY — DX: Bipolar disorder, unspecified: F31.9

## 2017-05-16 HISTORY — DX: Carrier or suspected carrier of methicillin resistant Staphylococcus aureus: Z22.322

## 2017-05-16 MED ORDER — LIDOCAINE-EPINEPHRINE 2 %-1:100000 IJ SOLN: 20.0000 mL | Freq: Once | INTRAMUSCULAR | Status: DC

## 2017-05-16 MED ORDER — CLINDAMYCIN HCL 150 MG PO CAPS: 450.0000 mg | ORAL_CAPSULE | Freq: Three times a day (TID) | ORAL | 0 refills | Status: AC

## 2017-05-16 NOTE — ED Notes (Signed)
Here for abscess L knee (marked): 9cmx8.5cm, redness, warmth, swelling, central 2mm blister, intact, have applied antibiotic ointment, noticed yesterday, denies lancing, rates pain 6/10.  Alert, NAD, calm, interactive, resps e/u, speaking in clear complete sentences, no dyspnea noted, skin W&D, initial VSS, (denies: sob, NVD, drainage, numbness, tingling, dizziness or visual changes). H/o MRSA in January L axilla while in jail.   Here from [facility]

## 2017-05-16 NOTE — ED Triage Notes (Signed)
Has abscess noted to left knee, since yesterday. Brought in from [facility]

## 2017-05-16 NOTE — ED Provider Notes (Signed)
[facility] EMERGENCY DEPARTMENT Provider Note   CSN: [id number] Arrival date & time: 05/16/17  1324     History   Chief Complaint Chief Complaint  Patient presents with  . Abscess    HPI Logan Hancock is a 27 y.o. male.  27 yo M with a cc of left knee bump.  Occurred yesterday, mildly tender.  Denies fevers, nausea, vomiting.   The history is provided by the patient.  Abscess  Associated symptoms: no fever, no headaches and no vomiting   Illness  This is a new problem. The current episode started yesterday. The problem occurs constantly. The problem has been gradually worsening. Pertinent negatives include no chest pain, no abdominal pain, no headaches and no shortness of breath. Nothing aggravates the symptoms. Nothing relieves the symptoms. He has tried nothing for the symptoms. The treatment provided no relief.    Past Medical History:  Diagnosis Date  . Asthma   . Bipolar 1 disorder (HCC)   . Drug addiction (HCC)   . MRSA (methicillin resistant staph aureus) culture positive     Patient Active Problem List   Diagnosis Date Noted  . Severe recurrent major depression without psychotic features (HCC) 12/03/2016  . Heroin use disorder, severe (HCC) 12/03/2016  . Substance induced mood disorder (HCC) 10/14/2016  . Polysubstance abuse (HCC) 10/14/2016  . Suicidal ideation     No past surgical history on file.      Home Medications    Prior to Admission medications   Medication Sig Start Date End Date Taking? Authorizing Provider  albuterol (PROVENTIL HFA;VENTOLIN HFA) 108 (90 Base) MCG/ACT inhaler Inhale 1-2 puffs into the lungs every 6 (six) hours as needed for wheezing or shortness of breath. 05/05/17   Maczis, Elmer Sow, PA-C  buPROPion (WELLBUTRIN XL) 300 MG 24 hr tablet Take 1 tablet (300 mg total) by mouth daily. For mood control 12/08/16   Money, Gerlene Burdock, FNP  clindamycin (CLEOCIN) 150 MG capsule Take 3 capsules (450 mg total) by mouth 3 (three)  times daily for 7 days. 05/16/17 05/23/17  Melene Plan, DO  divalproex (DEPAKOTE) 500 MG DR tablet TAKE 2 TABLETS IN THE EVENING 05/02/17   [provider]  hydrOXYzine (ATARAX/VISTARIL) 25 MG tablet Take 1 tablet (25 mg total) by mouth every 6 (six) hours as needed for anxiety. 12/07/16   Money, Gerlene Burdock, FNP  lamoTRIgine (LAMICTAL) 100 MG tablet Take 100 mg by mouth every evening. 05/02/17   [provider]  mirtazapine (REMERON) 15 MG tablet 15 mg at bedtime. 05/02/17   [provider]  traZODone (DESYREL) 50 MG tablet Take 1 tablet (50 mg total) by mouth at bedtime as needed for sleep. 12/07/16   Money, Gerlene Burdock, FNP    Family History No family history on file.  Social History Social History   Tobacco Use  . Smoking status: Current Every Day Smoker    Packs/day: 0.50    Years: 2.00    Pack years: 1.00    Types: Cigarettes  . Smokeless tobacco: Never Used  Substance Use Topics  . Alcohol use: No    Frequency: Never  . Drug use: Yes    Frequency: 3.0 times per week    Types: Marijuana, Cocaine     Allergies   Patient has no known allergies.   Review of Systems Review of Systems  Constitutional: Negative for chills and fever.  HENT: Negative for congestion and facial swelling.   Eyes: Negative for discharge and  visual disturbance.  Respiratory: Negative for shortness of breath.   Cardiovascular: Negative for chest pain and palpitations.  Gastrointestinal: Negative for abdominal pain, diarrhea and vomiting.  Musculoskeletal: Negative for arthralgias and myalgias.  Skin: Positive for color change. Negative for rash.  Neurological: Negative for tremors, syncope and headaches.  Psychiatric/Behavioral: Negative for confusion and dysphoric mood.     Physical Exam Updated Vital Signs BP (!) 115/57 (BP Location: Right Arm)   Pulse 65   Temp 97.8 F (36.6 C) (Oral)   Resp 16   Ht  (1.905 m)   Wt 104.3 kg (230 lb)   SpO2 100%   BMI 28.75  kg/m   Physical Exam  Constitutional: He is oriented to person, place, and time. He appears well-developed and well-nourished.  HENT:  Head: Normocephalic and atraumatic.  Eyes: Pupils are equal, round, and reactive to light. EOM are normal.  Neck: Normal range of motion. Neck supple. No JVD present.  Cardiovascular: Normal rate and regular rhythm. Exam reveals no gallop and no friction rub.  No murmur heard. Pulmonary/Chest: No respiratory distress. He has no wheezes.  Abdominal: He exhibits no distension and no mass. There is no tenderness. There is no rebound and no guarding.  Musculoskeletal: Normal range of motion.  Neurological: He is alert and oriented to person, place, and time.  Skin: No rash noted. No pallor.  Small pustule to the left knee.  Mild surrounding induration.  fluctuant  Psychiatric: He has a normal mood and affect. His behavior is normal.  Nursing note and vitals reviewed.    ED Treatments / Results  Labs (all labs ordered are listed, but only abnormal results are displayed) Labs Reviewed - No data to display  EKG None  Radiology No results found.  Procedures Procedures (including critical care time)  Medications Ordered in ED Medications  lidocaine-EPINEPHrine (XYLOCAINE W/EPI) 2 %-1:100000 (with pres) injection 20 mL (has no administration in time range)     Initial Impression / Assessment and Plan / ED Course  I have reviewed the triage vital signs and the nursing notes.  Pertinent labs & imaging results that were available during my care of the patient were reviewed by me and considered in my medical decision making (see chart for details).     27 yo M with a cc of an abscess.  No systemic symptoms.  Declining I&D, will start on clinda.  PCP follow up.  Warm compresses.   7:14 AM:  I have discussed the diagnosis/risks/treatment options with the patient and believe the pt to be eligible for discharge home to follow-up with PCP. We also  discussed returning to the ED immediately if new or worsening sx occur. We discussed the sx which are most concerning (e.g., sudden worsening pain, fever, inability to tolerate by mouth) that necessitate immediate return. Medications administered to the patient during their visit and any new prescriptions provided to the patient are listed below.  Medications given during this visit Medications  lidocaine-EPINEPHrine (XYLOCAINE W/EPI) 2 %-1:100000 (with pres) injection 20 mL (has no administration in time range)     The patient appears reasonably screen and/or stabilized for discharge and I doubt any other medical condition or other [facility] requiring further screening, evaluation, or treatment in the ED at this time prior to discharge.    Final Clinical Impressions(s) / ED Diagnoses   Final diagnoses:  Abscess    ED Discharge Orders        Ordered    clindamycin (CLEOCIN)  150 MG capsule  3 times daily     05/16/17 0708       Melene Plan, DO 05/16/17 0715

## 2017-06-27 ENCOUNTER — Other Ambulatory Visit: Payer: Self-pay

## 2017-06-27 ENCOUNTER — Emergency Department (HOSPITAL_COMMUNITY)
Admission: EM | Admit: 2017-06-27 | Discharge: 2017-06-27 | Disposition: A | Discharge status: Home / Self Care | Payer: Self-pay | Attending: Emergency Medicine | Admitting: Emergency Medicine

## 2017-06-27 ENCOUNTER — Encounter (HOSPITAL_COMMUNITY): Payer: Self-pay | Admitting: Emergency Medicine

## 2017-06-27 DIAGNOSIS — H60501 Unspecified acute noninfective otitis externa, right ear: Secondary | ICD-10-CM | POA: Insufficient documentation

## 2017-06-27 DIAGNOSIS — Z79899 Other long term (current) drug therapy: Secondary | ICD-10-CM | POA: Insufficient documentation

## 2017-06-27 DIAGNOSIS — H6122 Impacted cerumen, left ear: Secondary | ICD-10-CM | POA: Insufficient documentation

## 2017-06-27 DIAGNOSIS — F17210 Nicotine dependence, cigarettes, uncomplicated: Secondary | ICD-10-CM | POA: Insufficient documentation

## 2017-06-27 DIAGNOSIS — J45909 Unspecified asthma, uncomplicated: Secondary | ICD-10-CM | POA: Insufficient documentation

## 2017-06-27 MED ORDER — IBUPROFEN 200 MG PO TABS
400.0000 mg | ORAL_TABLET | Freq: Once | ORAL | Status: AC | PRN
  Administered 2017-06-27: 400 mg via ORAL
  Filled 2017-06-27: qty 2

## 2017-06-27 MED ORDER — OFLOXACIN 0.3 % OT SOLN: 5.0000 [drp] | Freq: Two times a day (BID) | OTIC | 0 refills | Status: DC

## 2017-06-27 NOTE — ED Triage Notes (Signed)
Pt states that he has had rt ear pain since last night.

## 2017-06-27 NOTE — ED Provider Notes (Signed)
[facility]-EMERGENCY DEPT Provider Note   CSN: [id number] Arrival date & time: 06/27/17  1800     History   Chief Complaint Chief Complaint  Patient presents with  . Otalgia    HPI Logan Hancock is a 27 y.o. male.  Logan Hancock is a 27 y.o. Male with a history of bipolar disorder, substance abuse, and asthma, who presents to the emergency department for evaluation of right ear pain.  He reports he has had some dull pain in the right ear over the past week or 2, but last night it became increasingly worse.  He denies any drainage from the ear or change in hearing.  He denies any pain in the left ear.  No associated fevers or chills.  He denies nasal congestion or rhinorrhea, sore throat or cough.  Patient reports he did have many ear infections as a child.  No history of diabetes.  Patient has not been swimming recently.     Past Medical History:  Diagnosis Date  . Asthma   . Bipolar 1 disorder (HCC)   . Drug addiction (HCC)   . MRSA (methicillin resistant staph aureus) culture positive     Patient Active Problem List   Diagnosis Date Noted  . Severe recurrent major depression without psychotic features (HCC) 12/03/2016  . Heroin use disorder, severe (HCC) 12/03/2016  . Substance induced mood disorder (HCC) 10/14/2016  . Polysubstance abuse (HCC) 10/14/2016  . Suicidal ideation     History reviewed. No pertinent surgical history.      Home Medications    Prior to Admission medications   Medication Sig Start Date End Date Taking? Authorizing Provider  albuterol (PROVENTIL HFA;VENTOLIN HFA) 108 (90 Base) MCG/ACT inhaler Inhale 1-2 puffs into the lungs every 6 (six) hours as needed for wheezing or shortness of breath. 05/05/17   Maczis, Elmer Sow, PA-C  buPROPion (WELLBUTRIN XL) 300 MG 24 hr tablet Take 1 tablet (300 mg total) by mouth daily. For mood control 12/08/16   Money, Feliz Beam B, FNP  divalproex (DEPAKOTE) 500 MG DR tablet TAKE 2 TABLETS IN  THE EVENING 05/02/17   [provider]  hydrOXYzine (ATARAX/VISTARIL) 25 MG tablet Take 1 tablet (25 mg total) by mouth every 6 (six) hours as needed for anxiety. 12/07/16   Money, Gerlene Burdock, FNP  lamoTRIgine (LAMICTAL) 100 MG tablet Take 100 mg by mouth every evening. 05/02/17   [provider]  mirtazapine (REMERON) 15 MG tablet 15 mg at bedtime. 05/02/17   [provider]  ofloxacin (FLOXIN) 0.3 % OTIC solution Place 5 drops into both ears 2 (two) times daily. 06/27/17   Dartha Lodge, PA-C  traZODone (DESYREL) 50 MG tablet Take 1 tablet (50 mg total) by mouth at bedtime as needed for sleep. 12/07/16   Money, Gerlene Burdock, FNP    Family History No family history on file.  Social History Social History   Tobacco Use  . Smoking status: Current Every Day Smoker    Packs/day: 0.50    Years: 2.00    Pack years: 1.00    Types: Cigarettes  . Smokeless tobacco: Never Used  Substance Use Topics  . Alcohol use: No    Frequency: Never  . Drug use: Yes    Frequency: 3.0 times per week    Types: Marijuana, Cocaine     Allergies   Patient has no known allergies.   Review of Systems Review of Systems  Constitutional: Negative for chills and fever.  HENT: Positive for ear pain. Negative for congestion, ear discharge, rhinorrhea and sore throat.   Respiratory: Negative for cough and shortness of breath.   Cardiovascular: Negative for chest pain.  Gastrointestinal: Negative for nausea and vomiting.  Skin: Negative for color change and rash.     Physical Exam Updated Vital Signs BP (!) 134/98 (BP Location: Right Arm)   Pulse 89   Temp 98 F (36.7 C) (Oral)   Resp 18   SpO2 100%   Physical Exam  Constitutional: He appears well-developed and well-nourished. No distress.  HENT:  Head: Normocephalic and atraumatic.  Right external ear canal is erythematous and edematous with small amount of white debris, TM is clear with good cone of light, pain with  manipulation of the right auricle and tragus, no mastoid erythema or tenderness, no erythema or swelling of the auricle, no pre-or postauricular lymphadenopathy.  Left ear canal with moderate cerumen impaction, canal is very minimally erythematous without any edema or debris, TM clear, bilateral nares patent without edema or rhinorrhea, posterior oropharynx is clear and moist, no erythema, edema or exudates  Eyes: Right eye exhibits no discharge. Left eye exhibits no discharge.  Pulmonary/Chest: Effort normal. No respiratory distress.  Neurological: He is alert. Coordination normal.  Skin: He is not diaphoretic.  Psychiatric: He has a normal mood and affect. His behavior is normal.  Nursing note and vitals reviewed.    ED Treatments / Results  Labs (all labs ordered are listed, but only abnormal results are displayed) Labs Reviewed - No data to display  EKG None  Radiology No results found.  Procedures .Ear Cerumen Removal Date/Time: 06/27/2017 8:49 PM Performed by: Dartha Lodge, PA-C Authorized by: Dartha Lodge, PA-C   Consent:    Consent obtained:  Verbal   Consent given by:  Patient   Risks discussed:  Infection, TM perforation, pain and incomplete removal   Alternatives discussed:  No treatment Procedure details:    Location:  L ear   Procedure type: irrigation   Post-procedure details:    Inspection:  TM intact   Hearing quality:  Normal   Patient tolerance of procedure:  Tolerated well, no immediate complications   (including critical care time)  Medications Ordered in ED Medications  ibuprofen (ADVIL,MOTRIN) tablet 400 mg (400 mg Oral Given 06/27/17 1827)     Initial Impression / Assessment and Plan / ED Course  I have reviewed the triage vital signs and the nursing notes.  Pertinent labs & imaging results that were available during my care of the patient were reviewed by me and considered in my medical decision making (see chart for details).  Otitis  externa  Pt presenting with otitis externa. No canal occlusion, Pt afebrile in NAD. Exam non concerning for mastoiditis, cellulitis or malignant OE. Dc with ofloxacin script.  Advised pcp follow up in 2-3 days if no improvement with treatment or no complete resolution by 7 days.  Cerumen impaction on the left cleared with irrigation.  Return precautions discussed.  Patient expresses understanding and is in agreement with plan.   Final Clinical Impressions(s) / ED Diagnoses   Final diagnoses:  Acute otitis externa of right ear, unspecified type  Impacted cerumen of left ear    ED Discharge Orders        Ordered    ofloxacin (FLOXIN) 0.3 % OTIC solution  2 times daily     06/27/17 2020       Dartha Lodge, [place] 06/27/17 2049  Tegeler, Canary Brimhristopher J, MD 06/28/17 0120

## 2017-06-27 NOTE — Discharge Instructions (Signed)
Use eardrops as directed for the next week.  Ibuprofen and Tylenol as needed for pain.  If you have fevers, worsening ear pain, swelling or redness around the year or any other new or concerning symptoms do not hesitate to return to the ED otherwise please follow-up with your regular doctor.

## 2017-12-27 ENCOUNTER — Other Ambulatory Visit: Payer: Self-pay

## 2017-12-27 ENCOUNTER — Inpatient Hospital Stay (HOSPITAL_COMMUNITY): Payer: Self-pay

## 2017-12-27 ENCOUNTER — Emergency Department (HOSPITAL_COMMUNITY): Payer: Self-pay

## 2017-12-27 ENCOUNTER — Encounter (HOSPITAL_COMMUNITY): Payer: Self-pay

## 2017-12-27 ENCOUNTER — Inpatient Hospital Stay (HOSPITAL_COMMUNITY)
Admission: EM | Admit: 2017-12-27 | Discharge: 2018-01-11 | DRG: 854 | Disposition: A | Discharge status: Home / Self Care | Payer: Self-pay | Attending: Internal Medicine | Admitting: Internal Medicine

## 2017-12-27 DIAGNOSIS — E878 Other disorders of electrolyte and fluid balance, not elsewhere classified: Secondary | ICD-10-CM | POA: Diagnosis present

## 2017-12-27 DIAGNOSIS — F1110 Opioid abuse, uncomplicated: Secondary | ICD-10-CM | POA: Diagnosis present

## 2017-12-27 DIAGNOSIS — R6520 Severe sepsis without septic shock: Secondary | ICD-10-CM | POA: Diagnosis present

## 2017-12-27 DIAGNOSIS — E871 Hypo-osmolality and hyponatremia: Secondary | ICD-10-CM | POA: Diagnosis present

## 2017-12-27 DIAGNOSIS — R197 Diarrhea, unspecified: Secondary | ICD-10-CM

## 2017-12-27 DIAGNOSIS — F1910 Other psychoactive substance abuse, uncomplicated: Secondary | ICD-10-CM | POA: Diagnosis present

## 2017-12-27 DIAGNOSIS — F1120 Opioid dependence, uncomplicated: Secondary | ICD-10-CM | POA: Diagnosis present

## 2017-12-27 DIAGNOSIS — B1920 Unspecified viral hepatitis C without hepatic coma: Secondary | ICD-10-CM | POA: Diagnosis present

## 2017-12-27 DIAGNOSIS — N179 Acute kidney failure, unspecified: Secondary | ICD-10-CM | POA: Diagnosis present

## 2017-12-27 DIAGNOSIS — A4101 Sepsis due to Methicillin susceptible Staphylococcus aureus: Principal | ICD-10-CM | POA: Diagnosis present

## 2017-12-27 DIAGNOSIS — J45909 Unspecified asthma, uncomplicated: Secondary | ICD-10-CM | POA: Diagnosis present

## 2017-12-27 DIAGNOSIS — A419 Sepsis, unspecified organism: Secondary | ICD-10-CM | POA: Diagnosis present

## 2017-12-27 DIAGNOSIS — R112 Nausea with vomiting, unspecified: Secondary | ICD-10-CM

## 2017-12-27 DIAGNOSIS — Z8614 Personal history of Methicillin resistant Staphylococcus aureus infection: Secondary | ICD-10-CM

## 2017-12-27 DIAGNOSIS — M65811 Other synovitis and tenosynovitis, right shoulder: Secondary | ICD-10-CM | POA: Diagnosis present

## 2017-12-27 DIAGNOSIS — E876 Hypokalemia: Secondary | ICD-10-CM | POA: Diagnosis present

## 2017-12-27 DIAGNOSIS — F1410 Cocaine abuse, uncomplicated: Secondary | ICD-10-CM | POA: Diagnosis present

## 2017-12-27 DIAGNOSIS — Z79899 Other long term (current) drug therapy: Secondary | ICD-10-CM

## 2017-12-27 DIAGNOSIS — B9561 Methicillin susceptible Staphylococcus aureus infection as the cause of diseases classified elsewhere: Secondary | ICD-10-CM | POA: Diagnosis present

## 2017-12-27 DIAGNOSIS — M009 Pyogenic arthritis, unspecified: Secondary | ICD-10-CM | POA: Diagnosis present

## 2017-12-27 DIAGNOSIS — M00011 Staphylococcal arthritis, right shoulder: Secondary | ICD-10-CM | POA: Diagnosis present

## 2017-12-27 DIAGNOSIS — D696 Thrombocytopenia, unspecified: Secondary | ICD-10-CM | POA: Diagnosis present

## 2017-12-27 DIAGNOSIS — F319 Bipolar disorder, unspecified: Secondary | ICD-10-CM | POA: Diagnosis present

## 2017-12-27 DIAGNOSIS — F17210 Nicotine dependence, cigarettes, uncomplicated: Secondary | ICD-10-CM | POA: Diagnosis present

## 2017-12-27 DIAGNOSIS — F1920 Other psychoactive substance dependence, uncomplicated: Secondary | ICD-10-CM | POA: Diagnosis present

## 2017-12-27 LAB — CBC WITH DIFFERENTIAL/PLATELET
Abs Immature Granulocytes: 0.03 10*3/uL (ref 0.00–0.07)
BASOS PCT: 0 %
Basophils Absolute: 0 10*3/uL (ref 0.0–0.1)
EOS ABS: 0 10*3/uL (ref 0.0–0.5)
Eosinophils Relative: 0 %
HCT: 44.9 % (ref 39.0–52.0)
Hemoglobin: 14.3 g/dL (ref 13.0–17.0)
Immature Granulocytes: 0 %
Lymphocytes Relative: 4 %
Lymphs Abs: 0.4 10*3/uL — ABNORMAL LOW (ref 0.7–4.0)
MCH: 26.3 pg (ref 26.0–34.0)
MCHC: 31.8 g/dL (ref 30.0–36.0)
MCV: 82.7 fL (ref 80.0–100.0)
Monocytes Absolute: 0.1 10*3/uL (ref 0.1–1.0)
Monocytes Relative: 1 %
Neutro Abs: 9.4 10*3/uL — ABNORMAL HIGH (ref 1.7–7.7)
Neutrophils Relative %: 95 %
Platelets: 156 10*3/uL (ref 150–400)
RBC: 5.43 MIL/uL (ref 4.22–5.81)
RDW: 15.6 % — AB (ref 11.5–15.5)
WBC Morphology: INCREASED
WBC: 9.8 10*3/uL (ref 4.0–10.5)
nRBC: 0 % (ref 0.0–0.2)

## 2017-12-27 LAB — PROTIME-INR
INR: 1.1
Prothrombin Time: 14.1 seconds (ref 11.4–15.2)

## 2017-12-27 LAB — COMPREHENSIVE METABOLIC PANEL
ALT: 25 U/L (ref 0–44)
AST: 27 U/L (ref 15–41)
Albumin: 4 g/dL (ref 3.5–5.0)
Alkaline Phosphatase: 63 U/L (ref 38–126)
Anion gap: 13 (ref 5–15)
BUN: 11 mg/dL (ref 6–20)
CO2: 23 mmol/L (ref 22–32)
Calcium: 8.8 mg/dL — ABNORMAL LOW (ref 8.9–10.3)
Chloride: 94 mmol/L — ABNORMAL LOW (ref 98–111)
Creatinine, Ser: 0.89 mg/dL (ref 0.61–1.24)
GFR calc Af Amer: >60 mL/min (ref >60)
GFR calc non Af Amer: >60 mL/min (ref >60)
Glucose, Bld: 125 mg/dL — ABNORMAL HIGH (ref 70–99)
POTASSIUM: 3.9 mmol/L (ref 3.5–5.1)
Sodium: 130 mmol/L — ABNORMAL LOW (ref 135–145)
TOTAL PROTEIN: 7.6 g/dL (ref 6.5–8.1)
Total Bilirubin: 4.1 mg/dL — ABNORMAL HIGH (ref 0.3–1.2)

## 2017-12-27 LAB — CG4 I-STAT (LACTIC ACID): Lactic Acid, Venous: 1.18 mmol/L (ref 0.5–1.9)

## 2017-12-27 LAB — C-REACTIVE PROTEIN: CRP: 19.1 mg/dL — AB (ref <1.0)

## 2017-12-27 LAB — APTT: aPTT: 39 seconds — ABNORMAL HIGH (ref 24–36)

## 2017-12-27 LAB — SEDIMENTATION RATE: Sed Rate: 9 mm/hr (ref 0–16)

## 2017-12-27 MED ORDER — VANCOMYCIN HCL 10 G IV SOLR
2000.0000 mg | Freq: Once | INTRAVENOUS | Status: AC
  Administered 2017-12-27: 2000 mg via INTRAVENOUS
  Filled 2017-12-27: qty 2000

## 2017-12-27 MED ORDER — LIDOCAINE HCL (PF) 1 % IJ SOLN
5.0000 mL | Freq: Once | INTRAMUSCULAR | Status: AC
  Administered 2017-12-27: 5 mL
  Filled 2017-12-27: qty 30

## 2017-12-27 MED ORDER — CLONIDINE HCL 0.1 MG PO TABS
0.1000 mg | ORAL_TABLET | ORAL | Status: AC
  Administered 2017-12-30 – 2018-01-01 (×4): 0.1 mg via ORAL
  Filled 2017-12-27 (×4): qty 1

## 2017-12-27 MED ORDER — ENOXAPARIN SODIUM 40 MG/0.4ML ~~LOC~~ SOLN: 40.0000 mg | SUBCUTANEOUS | Status: DC

## 2017-12-27 MED ORDER — ACETAMINOPHEN 325 MG PO TABS
650.0000 mg | ORAL_TABLET | Freq: Four times a day (QID) | ORAL | Status: DC | PRN
  Administered 2017-12-27: 650 mg via ORAL
  Filled 2017-12-27: qty 2

## 2017-12-27 MED ORDER — LOPERAMIDE HCL 2 MG PO CAPS: 2.0000 mg | ORAL_CAPSULE | ORAL | Status: AC | PRN

## 2017-12-27 MED ORDER — METRONIDAZOLE IN NACL 5-0.79 MG/ML-% IV SOLN
500.0000 mg | Freq: Three times a day (TID) | INTRAVENOUS | Status: DC
  Administered 2017-12-27 – 2017-12-28 (×2): 500 mg via INTRAVENOUS
  Filled 2017-12-27 (×3): qty 100

## 2017-12-27 MED ORDER — HYDROXYZINE HCL 25 MG PO TABS: 25.0000 mg | ORAL_TABLET | Freq: Four times a day (QID) | ORAL | Status: DC | PRN

## 2017-12-27 MED ORDER — METHOCARBAMOL 500 MG PO TABS
500.0000 mg | ORAL_TABLET | Freq: Three times a day (TID) | ORAL | Status: AC | PRN
  Filled 2017-12-27 (×3): qty 1

## 2017-12-27 MED ORDER — HYDROMORPHONE HCL 1 MG/ML IJ SOLN: 1.0000 mg | INTRAMUSCULAR | Status: DC | PRN

## 2017-12-27 MED ORDER — ACETAMINOPHEN 325 MG PO TABS
650.0000 mg | ORAL_TABLET | Freq: Once | ORAL | Status: AC
  Administered 2017-12-27: 650 mg via ORAL
  Filled 2017-12-27: qty 2

## 2017-12-27 MED ORDER — ALBUTEROL SULFATE (2.5 MG/3ML) 0.083% IN NEBU: 2.5000 mg | INHALATION_SOLUTION | RESPIRATORY_TRACT | Status: DC | PRN

## 2017-12-27 MED ORDER — ONDANSETRON HCL 4 MG PO TABS: 4.0000 mg | ORAL_TABLET | Freq: Four times a day (QID) | ORAL | Status: DC | PRN

## 2017-12-27 MED ORDER — HYDROMORPHONE HCL 1 MG/ML IJ SOLN
1.0000 mg | Freq: Once | INTRAMUSCULAR | Status: AC
  Administered 2017-12-27: 1 mg via INTRAVENOUS
  Filled 2017-12-27: qty 1

## 2017-12-27 MED ORDER — ONDANSETRON 4 MG PO TBDP: 4.0000 mg | ORAL_TABLET | Freq: Four times a day (QID) | ORAL | Status: DC | PRN

## 2017-12-27 MED ORDER — HYDROMORPHONE HCL 1 MG/ML IJ SOLN
1.0000 mg | Freq: Once | INTRAMUSCULAR | Status: AC
  Administered 2017-12-27: 1 mg via INTRAMUSCULAR
  Filled 2017-12-27: qty 1

## 2017-12-27 MED ORDER — ACETAMINOPHEN 325 MG PO TABS
325.0000 mg | ORAL_TABLET | Freq: Once | ORAL | Status: AC
  Administered 2017-12-27: 325 mg via ORAL
  Filled 2017-12-27: qty 1

## 2017-12-27 MED ORDER — SODIUM CHLORIDE 0.9 % IV SOLN
2.0000 g | Freq: Once | INTRAVENOUS | Status: AC
  Administered 2017-12-27: 2 g via INTRAVENOUS
  Filled 2017-12-27: qty 2

## 2017-12-27 MED ORDER — SODIUM CHLORIDE 0.9 % IV BOLUS
1000.0000 mL | Freq: Once | INTRAVENOUS | Status: AC
  Administered 2017-12-27: 1000 mL via INTRAVENOUS

## 2017-12-27 MED ORDER — ONDANSETRON HCL 4 MG/2ML IJ SOLN
4.0000 mg | Freq: Four times a day (QID) | INTRAMUSCULAR | Status: DC | PRN
  Administered 2017-12-27: 4 mg via INTRAVENOUS
  Filled 2017-12-27: qty 2

## 2017-12-27 MED ORDER — KETOROLAC TROMETHAMINE 30 MG/ML IJ SOLN
30.0000 mg | Freq: Four times a day (QID) | INTRAMUSCULAR | Status: DC | PRN
  Administered 2017-12-27: 30 mg via INTRAVENOUS
  Filled 2017-12-27: qty 1

## 2017-12-27 MED ORDER — ACETAMINOPHEN 650 MG RE SUPP: 650.0000 mg | Freq: Four times a day (QID) | RECTAL | Status: DC | PRN

## 2017-12-27 MED ORDER — SODIUM CHLORIDE 0.9% FLUSH
3.0000 mL | Freq: Two times a day (BID) | INTRAVENOUS | Status: DC
  Administered 2017-12-27 – 2018-01-04 (×6): 3 mL via INTRAVENOUS

## 2017-12-27 MED ORDER — CLONIDINE HCL 0.1 MG PO TABS
0.1000 mg | ORAL_TABLET | Freq: Every day | ORAL | Status: AC
  Administered 2018-01-02 – 2018-01-03 (×2): 0.1 mg via ORAL
  Filled 2017-12-27 (×2): qty 1

## 2017-12-27 MED ORDER — HYDROMORPHONE HCL 1 MG/ML IJ SOLN: 0.5000 mg | INTRAMUSCULAR | Status: DC | PRN

## 2017-12-27 MED ORDER — SODIUM CHLORIDE 0.9 % IV SOLN
INTRAVENOUS | Status: DC
  Administered 2017-12-27 – 2018-01-10 (×23): via INTRAVENOUS

## 2017-12-27 MED ORDER — ENOXAPARIN SODIUM 40 MG/0.4ML ~~LOC~~ SOLN
40.0000 mg | Freq: Every day | SUBCUTANEOUS | Status: DC
  Administered 2017-12-28 – 2018-01-10 (×14): 40 mg via SUBCUTANEOUS
  Filled 2017-12-27 (×15): qty 0.4

## 2017-12-27 MED ORDER — NAPROXEN 250 MG PO TABS
500.0000 mg | ORAL_TABLET | Freq: Two times a day (BID) | ORAL | Status: AC | PRN
  Filled 2017-12-27: qty 2

## 2017-12-27 MED ORDER — DICYCLOMINE HCL 20 MG PO TABS
20.0000 mg | ORAL_TABLET | Freq: Four times a day (QID) | ORAL | Status: AC | PRN
  Administered 2017-12-27: 20 mg via ORAL
  Filled 2017-12-27 (×2): qty 1

## 2017-12-27 MED ORDER — SODIUM CHLORIDE 0.9 % IV SOLN
2.0000 g | Freq: Three times a day (TID) | INTRAVENOUS | Status: DC
  Administered 2017-12-28: 2 g via INTRAVENOUS
  Filled 2017-12-27 (×3): qty 2

## 2017-12-27 MED ORDER — CLONIDINE HCL 0.1 MG PO TABS
0.1000 mg | ORAL_TABLET | Freq: Four times a day (QID) | ORAL | Status: AC
  Administered 2017-12-27 – 2017-12-29 (×3): 0.1 mg via ORAL
  Filled 2017-12-27 (×6): qty 1

## 2017-12-27 NOTE — H&P (Addendum)
History and Physical    Logan Hancock:[id number] DOB: 05-06-1990 DOA: 12/27/2017  Referring MD/NP/PA: Joline Maxcy, PA-C PCP: Patient, No Pcp Per  Patient coming from: Home  Chief Complaint: Right shoulder pain  I have personally briefly reviewed patient's old medical records in [place]   HPI: Logan Hancock is a 27 y.o. male with medical history significant of polysubstance abuse (including cocaine, IV heroin, and marijuana), bipolar disorder, and history of MRSA; who presents with  complaints of 5 days of right shoulder pain and swelling.  Denies having any trauma, injury, or wound present prior to onset of symptoms.  He admits to using IV heroin and injecting into both arms, and reports always using clean needles.  Pain is noted to be constant and any movement of the shoulder exacerbates pain.  Associated symptoms include fever, chills, sweats, nausea, vomiting, diarrhea, and joint pain.  He notes that he had an abscess of his right axilla that grew MRSA.  Denies having any neck pain, confusion, weakness, chest pain, or shortness of breath.  Patient also admits to recent use of meth and and normally smokes 1 pack cigarettes per day on average.  Denies any alcohol use and he was also recently incarcerated.   ED Course: Upon admission to the emergency department patient was noted to be febrile at 101.2 F, heart rate 90-131, respiration 18-32, and other vital signs maintained.  Labs reveal left shift, sodium 130, potassium 3.9, chloride 94, calcium 8.8, glucose 125, ESR 9, CRP 19.1, and lactic acid 1.18.  X-rays of the right shoulder show no acute abnormality.  They attempted to tap the right shoulder, but it was unsuccessful.  Dr. Veverly Fells of orthopedics was consulted.  Sepsis protocol was initiated and patient was started on empiric antibiotics of vancomycin and cefepime and given 1 L of normal saline IV fluids.  TRH called to admit for cellulitis versus septic joint.  MRI of the right  shoulder was pending.    Review of Systems  Constitutional: Positive for chills, diaphoresis, fever and malaise/fatigue.  HENT: Negative for congestion and nosebleeds.   Eyes: Negative for photophobia and pain.  Respiratory: Negative for cough and shortness of breath.   Cardiovascular: Negative for chest pain, leg swelling and PND.  Gastrointestinal: Positive for diarrhea, nausea and vomiting.  Genitourinary: Negative for dysuria and hematuria.  Musculoskeletal: Positive for joint pain. Negative for neck pain.  Skin:       Positive for skin color change  Neurological: Negative for focal weakness and loss of consciousness.  Psychiatric/Behavioral: Positive for substance abuse. Negative for memory loss.    Past Medical History:  Diagnosis Date  . Asthma   . Bipolar 1 disorder ([facility])   . Drug addiction ([facility])   . MRSA (methicillin resistant staph aureus) culture positive     History reviewed. No pertinent surgical history.   reports that he has been smoking cigarettes. He has a 1.00 pack-year smoking history. He has never used smokeless tobacco. He reports current drug use. Frequency: 3.00 times per week. Drugs: Marijuana and Cocaine. He reports that he does not drink alcohol.  No Known Allergies  History reviewed. No pertinent family history.  Prior to Admission medications   Medication Sig Start Date End Date Taking? Authorizing Provider  albuterol (PROVENTIL HFA;VENTOLIN HFA) 108 (90 Base) MCG/ACT inhaler Inhale 1-2 puffs into the lungs every 6 (six) hours as needed for wheezing or shortness of breath. 05/05/17  Yes Maczis, Barth Kirks, PA-C  buPROPion (  WELLBUTRIN XL) 300 MG 24 hr tablet Take 1 tablet (300 mg total) by mouth daily. For mood control 12/08/16  Yes Money, Lowry Ram, FNP  divalproex (DEPAKOTE) 500 MG DR tablet Take 1,000 mg by mouth every evening.  05/02/17  Yes [provider]  lamoTRIgine (LAMICTAL) 100 MG tablet Take 100 mg by mouth every evening. 05/02/17  Yes  [provider]  mirtazapine (REMERON) 15 MG tablet Take 15 mg by mouth at bedtime.  05/02/17  Yes [provider]  hydrOXYzine (ATARAX/VISTARIL) 25 MG tablet Take 1 tablet (25 mg total) by mouth every 6 (six) hours as needed for anxiety. 12/07/16   Money, Lowry Ram, FNP  ofloxacin (FLOXIN) 0.3 % OTIC solution Place 5 drops into both ears 2 (two) times daily. Patient not taking: Reported on 12/27/2017 06/27/17   Jacqlyn Larsen, PA-C  traZODone (DESYREL) 50 MG tablet Take 1 tablet (50 mg total) by mouth at bedtime as needed for sleep. 12/07/16   Money, Lowry Ram, FNP    Physical Exam:  Constitutional: Young male who appears to be in moderate discomfort Vitals:   12/27/17 1407 12/27/17 1549 12/27/17 1708 12/27/17 1813  BP:  (!) 157/95 (!) 156/85 (!) 148/72  Pulse:  (!) 104 (!) 124 (!) 131  Resp:  (!) 22 (!) 22 (!) 32  Temp:  98.9 F (37.2 C) (!) 101.2 F (38.4 C)   TempSrc:  Oral Oral   SpO2:  99% 97% 97%  Weight: 104.3 kg     Height: '6\' 4"'  (1.93 m)      Eyes: PERRL, lids and conjunctivae normal ENMT: Mucous membranes are moist. Posterior pharynx clear of any exudate or lesions. Neck: normal, supple, no masses, no thyromegaly Respiratory: Mildly tachypneic, but no wheezes or rhonchi appreciated.  Patient able to talk in complete sentences Cardiovascular: Tachycardic, no murmurs / rubs / gallops. No extremity edema. 2+ pedal pulses. No carotid bruits.  Abdomen: no tenderness, no masses palpated. No hepatosplenomegaly. Bowel sounds positive.  Musculoskeletal: no clubbing / cyanosis.  Decreased movement of the right shoulder joint which appears secondary to pain.  Right shoulder is swollen with erythema present. Skin: Erythema present the right shoulder and there are signs of track marks present. Neurologic: CN 2-12 grossly intact. Sensation intact, DTR normal. Strength 5/5 in all 4.  Psychiatric: Fair judgment and insight. Alert and oriented x 3. Normal mood.     Labs on  Admission: I have personally reviewed following labs and imaging studies  CBC: Recent Labs  Lab 12/27/17 1619  WBC 9.8  NEUTROABS PENDING  HGB 14.3  HCT 44.9  MCV 82.7  PLT 756   Basic Metabolic Panel: Recent Labs  Lab 12/27/17 1619  NA 130*  K 3.9  CL 94*  CO2 23  GLUCOSE 125*  BUN 11  CREATININE 0.89  CALCIUM 8.8*   GFR: Estimated Creatinine Clearance: 168.3 mL/min (by C-G formula based on SCr of 0.89 mg/dL). Liver Function Tests: Recent Labs  Lab 12/27/17 1619  AST 27  ALT 25  ALKPHOS 63  BILITOT 4.1*  PROT 7.6  ALBUMIN 4.0   No results for input(s): LIPASE, AMYLASE in the last 168 hours. No results for input(s): AMMONIA in the last 168 hours. Coagulation Profile: No results for input(s): INR, PROTIME in the last 168 hours. Cardiac Enzymes: No results for input(s): CKTOTAL, CKMB, CKMBINDEX, TROPONINI in the last 168 hours. BNP (last 3 results) No results for input(s): PROBNP in the last 8760 hours. HbA1C: No results  for input(s): HGBA1C in the last 72 hours. CBG: No results for input(s): GLUCAP in the last 168 hours. Lipid Profile: No results for input(s): CHOL, HDL, LDLCALC, TRIG, CHOLHDL, LDLDIRECT in the last 72 hours. Thyroid Function Tests: No results for input(s): TSH, T4TOTAL, FREET4, T3FREE, THYROIDAB in the last 72 hours. Anemia Panel: No results for input(s): VITAMINB12, FOLATE, FERRITIN, TIBC, IRON, RETICCTPCT in the last 72 hours. Urine analysis:    Component Value Date/Time   COLORURINE YELLOW 08/21/2015 [place] 08/21/2015 1539   LABSPEC 1.025 08/21/2015 1539   PHURINE 6.0 08/21/2015 1539   GLUCOSEU NEGATIVE 08/21/2015 1539   HGBUR NEGATIVE 08/21/2015 1539   BILIRUBINUR NEGATIVE 08/21/2015 1539   KETONESUR NEGATIVE 08/21/2015 1539   PROTEINUR NEGATIVE 08/21/2015 1539   NITRITE NEGATIVE 08/21/2015 1539   LEUKOCYTESUR NEGATIVE 08/21/2015 1539   Sepsis Labs: No results found for this or any previous visit (from  the past 240 hour(s)).   Radiological Exams on Admission: Dg Shoulder Right  Result Date: 12/27/2017 CLINICAL DATA:  RIGHT shoulder pain for 5 days, possible repetitive stress injury. EXAM: RIGHT SHOULDER - 2 VIEW; RIGHT HUMERUS - 2+ VIEW COMPARISON:  None. FINDINGS: RIGHT shoulder: The humeral head is well-formed and located. The subacromial, glenohumeral and acromioclavicular joint spaces are intact. No destructive bony lesions. Soft tissue planes are non-suspicious. RIGHT humerus: No acute fracture deformity or dislocation. No destructive bony lesions. Soft tissue planes are not suspicious. IMPRESSION: Negative. Electronically Signed   By: Elon Alas M.D.   On: 12/27/2017 14:56   Dg Humerus Right  Result Date: 12/27/2017 CLINICAL DATA:  RIGHT shoulder pain for 5 days, possible repetitive stress injury. EXAM: RIGHT SHOULDER - 2 VIEW; RIGHT HUMERUS - 2+ VIEW COMPARISON:  None. FINDINGS: RIGHT shoulder: The humeral head is well-formed and located. The subacromial, glenohumeral and acromioclavicular joint spaces are intact. No destructive bony lesions. Soft tissue planes are non-suspicious. RIGHT humerus: No acute fracture deformity or dislocation. No destructive bony lesions. Soft tissue planes are not suspicious. IMPRESSION: Negative. Electronically Signed   By: Elon Alas M.D.   On: 12/27/2017 14:56    EKG: Independently reviewed.  Sinus tachycardia 141 bpm  Assessment/Plan Sepsis 2/2 cellulitis and septic right shoulder: Patient presents with fever up to 101.2 F, tachycardia, tachypnea, right shoulder swelling, and pain with previous history of IV drug use.  WBC noted to be 9.8 with left shift and lactic acid within normal limits, but CRP elevated at 19.1. Dixon PA-C of orthopedics consulted due to the possibility of septic joint.  Blood cultures were obtained prior to patient being started on antibiotics of vancomycin and cefepime.  MRI revealed signs concerning for septic  arthritis. - Admit to a telemetry bed - Follow-up blood cultures - Continue with empiric antibiotics vancomycin and cefepime - Tylenol PRN fever - Toradol/diluadid prn moderate to severe pain - Follow-up MRI of the right shoulder  - N.p.o. for possible need of surgery - Appreciate orthopedic consultative services, will follow-up for further recommendations  Polysubstance abuse: Reports recent use of heroin yesterday and methamphetamines.  He denies any use of alcohol.  Admits to smoking 1 pack of cigarettes per day, but declined nicotine patch. - Check urine drug screen and HIV   - COWS protocol for opiate withdrawal with clonidine initiated - Social work consult for substance abuse  Nausea, vomiting, diarrhea: Acute.  Question if this is withdrawal symptoms from heroin and other drug use. - IV fluids at 100 mL/h - Continue symptomatic  treatment  Hyponatremia/hypochloremia: Patient sodium was noted to be mildly low at 130 and chloride 94.  Reports nausea, vomiting, and diarrhea is likely cause of symptoms. - IV fluids as seen above  Bipolar disorder: Patient reports that he is no longer on medications for bipolar disorder.  History of asthma - Albuterol neb as needed  DVT prophylaxis: Lovenox Code Status: Full Family Communication: *No family present at bedside  Disposition Plan: To be determined Consults called: Ortho  Admission status: Inpatient  Norval Morton MD [facility] Pager [phone]   If 7PM-7AM, please contact night-coverage [email or web address] Password [facility]  12/27/2017, 7:38 PM

## 2017-12-27 NOTE — Consult Note (Addendum)
Logan Hancock is an 27 y.o. male.    Chief Complaint: right shoulder pain  HPI: 27 y/o male c/o severe right shoulder pain that started 5 days ago. Pt has history of IV heroin use. States 5 days ago he was digging a ditch and noted increased right shoulder pain. Pain has worsened over the past 5 days until tonight when he came to emergency department. Pt is right hand dominant. Pt has had IV antibiotics and posterior attempt at aspiration. Denies any numbness/tingling or fall/injury. C/o pain with any rom of the right shoulder. Denies any other pain to left upper extremity or bilateral lower extremities.   PCP:  Patient, No Pcp Per  PMH: Past Medical History:  Diagnosis Date  . Asthma   . Bipolar 1 disorder ([facility])   . Drug addiction ([facility])   . MRSA (methicillin resistant staph aureus) culture positive     PSes:  No Known Allergies  Medications: Current Facility-Administered Medications  Medication Dose Route Frequency Provider Last Rate Last Dose  . HYDROmorphone (DILAUDID) injection 1 mg  1 mg Intravenous Once McDonald, Mia A, PA-C      . vancomycin (VANCOCIN) 2,000 mg in sodium chloride 0.9 % 500 mL IVPB  2,000 mg Intravenous Once Shaune Pollack, MD 250 mL/hr at 12/27/17 1840 2,000 mg at 12/27/17 1840   Current Outpatient Medications  Medication Sig Dispense Refill  . albuterol (PROVENTIL HFA;VENTOLIN HFA) 108 (90 Base) MCG/ACT inhaler Inhale 1-2 puffs into the lungs every 6 (six) hours as needed for wheezing or shortness of breath. 1 Inhaler 0  . buPROPion (WELLBUTRIN XL) 300 MG 24 hr tablet Take 1 tablet (300 mg total) by mouth daily. For mood control 30 tablet 0  . divalproex (DEPAKOTE) 500 MG DR tablet Take 1,000 mg by mouth every evening.   0  . lamoTRIgine (LAMICTAL) 100 MG tablet Take 100 mg by mouth every evening.  0  . mirtazapine (REMERON) 15 MG tablet Take 15 mg by mouth at bedtime.   0  . hydrOXYzine (ATARAX/VISTARIL) 25 MG tablet Take 1 tablet (25 mg total) by mouth  every 6 (six) hours as needed for anxiety. 30 tablet 0  . ofloxacin (FLOXIN) 0.3 % OTIC solution Place 5 drops into both ears 2 (two) times daily. (Patient not taking: Reported on 12/27/2017) 5 mL 0  . traZODone (DESYREL) 50 MG tablet Take 1 tablet (50 mg total) by mouth at bedtime as needed for sleep. 30 tablet 0    Results for orders placed or performed during the hospital encounter of 12/27/17 (from the past 48 hour(s))  Sedimentation rate     Status: None   Collection Time: 12/27/17  4:19 PM  Result Value Ref Range   Sed Rate 9 0 - 16 mm/hr    Comment: Performed at [facility], [address] [place], [place] [address]  C-reactive protein     Status: Abnormal   Collection Time: 12/27/17  4:19 PM  Result Value Ref Range   CRP 19.1 (H) <1.0 mg/dL    Comment: Performed at [facility], [address], [place], [place] [address]  CBC with Differential     Status: Abnormal (Preliminary result)   Collection Time: 12/27/17  4:19 PM  Result Value Ref Range   WBC 9.8 4.0 - 10.5 K/uL   RBC 5.43 4.22 - 5.81 MIL/uL   Hemoglobin 14.3 13.0 - 17.0 g/dL   HCT 09.8 11.9 - 14.7 %   MCV 82.7  80.0 - 100.0 fL   MCH 26.3 26.0 - 34.0 pg   MCHC 31.8 30.0 - 36.0 g/dL   RDW 40.9 (H) 81.1 - 91.4 %   Platelets 156 150 - 400 K/uL   nRBC 0.0 0.0 - 0.2 %    Comment: Performed at [facility], [address], [place], [place] [address]   Neutrophils Relative % PENDING %   Neutro Abs PENDING 1.7 - 7.7 K/uL   Band Neutrophils PENDING %   Lymphocytes Relative PENDING %   Lymphs Abs PENDING 0.7 - 4.0 K/uL   Monocytes Relative PENDING %   Monocytes Absolute PENDING 0.1 - 1.0 K/uL   Eosinophils Relative PENDING %   Eosinophils Absolute PENDING 0.0 - 0.5 K/uL   Basophils Relative PENDING %   Basophils Absolute PENDING 0.0 - 0.1 K/uL   WBC Morphology PENDING    RBC Morphology PENDING    Smear Review PENDING    Other PENDING %   nRBC PENDING 0 /100  WBC   Metamyelocytes Relative PENDING %   Myelocytes PENDING %   Promyelocytes Relative PENDING %   Blasts PENDING %  Comprehensive metabolic panel     Status: Abnormal   Collection Time: 12/27/17  4:19 PM  Result Value Ref Range   Sodium 130 (L) 135 - 145 mmol/L   Potassium 3.9 3.5 - 5.1 mmol/L   Chloride 94 (L) 98 - 111 mmol/L   CO2 23 22 - 32 mmol/L   Glucose, Bld 125 (H) 70 - 99 mg/dL   BUN 11 6 - 20 mg/dL   Creatinine, Ser 6.21 0.61 - 1.24 mg/dL   Calcium 8.8 (L) 8.9 - 10.3 mg/dL   Total Protein 7.6 6.5 - 8.1 g/dL   Albumin 4.0 3.5 - 5.0 g/dL   AST 27 15 - 41 U/L   ALT 25 0 - 44 U/L   Alkaline Phosphatase 63 38 - 126 U/L   Total Bilirubin 4.1 (H) 0.3 - 1.2 mg/dL   GFR calc non Af Amer >60 >60 mL/min   GFR calc Af Amer >60 >60 mL/min   Anion gap 13 5 - 15    Comment: Performed at [facility], [address] [place], [place] [address]  CG4 I-STAT (Lactic acid)     Status: None   Collection Time: 12/27/17  4:30 PM  Result Value Ref Range   Lactic Acid, Venous 1.18 0.5 - 1.9 mmol/L   Dg Shoulder Right  Result Date: 12/27/2017 CLINICAL DATA:  RIGHT shoulder pain for 5 days, possible repetitive stress injury. EXAM: RIGHT SHOULDER - 2 VIEW; RIGHT HUMERUS - 2+ VIEW COMPARISON:  None. FINDINGS: RIGHT shoulder: The humeral head is well-formed and located. The subacromial, glenohumeral and acromioclavicular joint spaces are intact. No destructive bony lesions. Soft tissue planes are non-suspicious. RIGHT humerus: No acute fracture deformity or dislocation. No destructive bony lesions. Soft tissue planes are not suspicious. IMPRESSION: Negative. Electronically Signed   By: Awilda Metro M.D.   On: 12/27/2017 14:56   Dg Humerus Right  Result Date: 12/27/2017 CLINICAL DATA:  RIGHT shoulder pain for 5 days, possible repetitive stress injury. EXAM: RIGHT SHOULDER - 2 VIEW; RIGHT HUMERUS - 2+ VIEW COMPARISON:  None. FINDINGS: RIGHT shoulder: The humeral head is  well-formed and located. The subacromial, glenohumeral and acromioclavicular joint spaces are intact. No destructive bony lesions. Soft tissue planes are non-suspicious. RIGHT humerus: No acute fracture deformity or dislocation. No destructive bony lesions. Soft tissue planes are not  suspicious. IMPRESSION: Negative. Electronically Signed   By: Awilda Metroourtnay  Bloomer M.D.   On: 12/27/2017 14:56    ROS: History of polysubstance abuse and suicidal ideation  Physical Exam: Alert and oriented 27 y/o male with bloodshot eyes and c/o right shoulder pain Cervical spine with full rom with no tenderness Right shoulder: no erythema, trace effusion, pain with any rom even with distaction nv intact distally Recent bruises to right dorsal hand from possible recent injections Left upper extremity and bilateral lower extremities with full rom with no tenderness or deformity No signs of acute infection to any other joint  Assessment/Plan Assessment: possible septic right shoulder vs painful bursitis  Plan: Discussed case with Dr. Ranell PatrickNorris and recommends stat MRI of the right shoulder CRP is 19.1 but he does not have a white blood count elevation currently Recommend hospitalist admission due to elevated CRP, hyponatremia and elevated bilirubin Will continue to monitor his progress Keep NPO for now  I have examined the patient and agree that we need further imaging to assess for possible septic shoulder. Will need to keep NPO in case surgical I+D warranted. Would add gram negative coverage for empiric abx.  Beverely LowSteve Keerstin Bjelland, MD  Alphonsa OverallBrad Dixon PA-C, MPAS [facility] now [facility]  [facility] [address], [place], [place] [address] Phone: [phone] [email or web address] Facebook  [employer]  LinkedIn  Twitter

## 2017-12-27 NOTE — ED Notes (Signed)
Patient transported to X-ray 

## 2017-12-27 NOTE — ED Provider Notes (Signed)
[facility] DEPT Provider Note   CSN: [id number] Arrival date & time: 12/27/17  1349     History   Chief Complaint Chief Complaint  Patient presents with  . Shoulder Pain  . Arm Pain    HPI Logan Hancock is a 27 y.o. male with a history of heroin use, asthma, bipolar 1 disorder, and right axillary abscess that grew MRSA who presents to the emergency department with a chief complaint of right shoulder pain.  The patient endorses right shoulder pain that began approximately 5 days ago.  States that he cannot move his right arm because the pain is so severe.  He states that he feels as if that sharp pain radiates down his arm towards his elbow.  No history of right shoulder surgery or injury.  States that he was treated for an abscess that grew MRSA in his right axillary region several months ago when he was incarcerated.  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  He reports associated chills, but has not had a fever.  He denies neck pain, back pain, left upper extremity pain, numbness, or weakness.   He reports that he is an IV drug user.  Last heroin use was yesterday.  Reports that he has used both his right and left upper extremities to shoot up over the last few weeks.  No history of septic joint or gout.  He is right-hand dominant.  The history is provided by the patient. No language interpreter was used.    Past Medical History:  Diagnosis Date  . Asthma   . Bipolar 1 disorder ([place])   . Drug addiction ([place])   . MRSA (methicillin resistant staph aureus) culture positive     Patient Active Problem List   Diagnosis Date Noted  . Sepsis ([place]) 12/27/2017  . Severe recurrent major depression without psychotic features ([employer]) 12/03/2016  . Heroin use disorder, severe ([place]) 12/03/2016  . Substance induced mood disorder ([place]) 10/14/2016  . Polysubstance abuse ([place]) 10/14/2016  . Suicidal  ideation     History reviewed. No pertinent surgical history.      Home Medications    Prior to Admission medications   Medication Sig Start Date End Date Taking? Authorizing Provider  albuterol (PROVENTIL HFA;VENTOLIN HFA) 108 (90 Base) MCG/ACT inhaler Inhale 1-2 puffs into the lungs every 6 (six) hours as needed for wheezing or shortness of breath. 05/05/17  Yes Maczis, Barth Kirks, PA-C  buPROPion (WELLBUTRIN XL) 300 MG 24 hr tablet Take 1 tablet (300 mg total) by mouth daily. For mood control 12/08/16  Yes Money, Lowry Ram, FNP  divalproex (DEPAKOTE) 500 MG DR tablet Take 1,000 mg by mouth every evening.  05/02/17  Yes [provider]  lamoTRIgine (LAMICTAL) 100 MG tablet Take 100 mg by mouth every evening. 05/02/17  Yes [provider]  mirtazapine (REMERON) 15 MG tablet Take 15 mg by mouth at bedtime.  05/02/17  Yes [provider]  hydrOXYzine (ATARAX/VISTARIL) 25 MG tablet Take 1 tablet (25 mg total) by mouth every 6 (six) hours as needed for anxiety. 12/07/16   Money, Lowry Ram, FNP  ofloxacin (FLOXIN) 0.3 % OTIC solution Place 5 drops into both ears 2 (two) times daily. Patient not taking: Reported on 12/27/2017 06/27/17   Jacqlyn Larsen, PA-C  traZODone (DESYREL) 50 MG tablet Take 1 tablet (50 mg total) by mouth at bedtime as  needed for sleep. 12/07/16   Money, Lowry Ram, FNP    Family History History reviewed. No pertinent family history.  Social History Social History   Tobacco Use  . Smoking status: Current Every Day Smoker    Packs/day: 0.50    Years: 2.00    Pack years: 1.00    Types: Cigarettes  . Smokeless tobacco: Never Used  Substance Use Topics  . Alcohol use: No    Frequency: Never  . Drug use: Yes    Frequency: 3.0 times per week    Types: Marijuana, Cocaine     Allergies   Patient has no known allergies.   Review of Systems Review of Systems  Constitutional: Positive for chills. Negative for activity change and fever.   Respiratory: Negative for shortness of breath.   Cardiovascular: Negative for chest pain.  Gastrointestinal: Negative for abdominal pain and nausea.  Musculoskeletal: Positive for arthralgias, joint swelling and myalgias. Negative for back pain, neck pain and neck stiffness.  Skin: Negative for rash.  Neurological: Negative for weakness and numbness.     Physical Exam Updated Vital Signs BP (!) 148/72   Pulse (!) 131   Temp (!) 102.9 F (39.4 C) (Oral)   Resp (!) 32   Ht '6\' 4"'  (1.93 m)   Wt 104.3 kg   SpO2 97%   BMI 28.00 kg/m   Physical Exam Vitals signs and nursing note reviewed.  Constitutional:      Appearance: He is well-developed.     Comments: Patient has rigors.  Appears uncomfortable and anxious.  HENT:     Head: Normocephalic.  Eyes:     Conjunctiva/sclera: Conjunctivae normal.     Comments: Bilateral eyes are injected.  Neck:     Musculoskeletal: Neck supple.  Cardiovascular:     Rate and Rhythm: Normal rate and regular rhythm.     Heart sounds: No murmur.  Pulmonary:     Effort: Pulmonary effort is normal.  Abdominal:     General: There is no distension.     Palpations: Abdomen is soft.  Musculoskeletal:        General: Tenderness present.     Comments: Right upper extremity is cradled against his chest.  He is refusing to move it.  Radial pulses are 2+ and symmetric.  Sensation is intact and equal throughout.  Good capillary refill of the digits of the bilateral hands.  Full active and passive range of motion of the right wrist.  Decreased range of motion of the right elbow secondary to worsening pain in his right shoulder.  No focal tenderness to the right elbow on exam.  He is tender to palpation over the insertion point of the biceps brachii at the supraglenoid tuberosity.  However, he is diffusely tender to palpation to the bony right shoulder.  No tenderness palpation to the right clavicle or scapula.  No tenderness to palpation to the cervical,  thoracic, or lumbar spinous processes or bilateral paraspinal muscles.  Right shoulder is minimally warm to the touch with mild erythema and edema.  Skin:    General: Skin is warm and dry.     Comments: Ecchymosis noted to the dorsum of the right hand.  Neurological:     Mental Status: He is alert.  Psychiatric:        Behavior: Behavior normal.      ED Treatments / Results  Labs (all labs ordered are listed, but only abnormal results are displayed) Labs Reviewed  C-REACTIVE PROTEIN -  Abnormal; Notable for the following components:      Result Value   CRP 19.1 (*)    All other components within normal limits  CBC WITH DIFFERENTIAL/PLATELET - Abnormal; Notable for the following components:   RDW 15.6 (*)    Neutro Abs 9.4 (*)    Lymphs Abs 0.4 (*)    All other components within normal limits  COMPREHENSIVE METABOLIC PANEL - Abnormal; Notable for the following components:   Sodium 130 (*)    Chloride 94 (*)    Glucose, Bld 125 (*)    Calcium 8.8 (*)    Total Bilirubin 4.1 (*)    All other components within normal limits  CULTURE, BLOOD (ROUTINE X 2)  CULTURE, BLOOD (ROUTINE X 2)  BODY FLUID CULTURE  GRAM STAIN  SEDIMENTATION RATE  GLUCOSE, BODY FLUID OTHER  PROTEIN, BODY FLUID (OTHER)  SYNOVIAL CELL COUNT + DIFF, W/ CRYSTALS  RAPID URINE DRUG SCREEN, HOSP PERFORMED  HIV ANTIBODY (ROUTINE TESTING W REFLEX)  CBC  BASIC METABOLIC PANEL  HEPATITIS PANEL, ACUTE  APTT  PROTIME-INR  I-STAT CG4 LACTIC ACID, ED  CG4 I-STAT (LACTIC ACID)    EKG None  Radiology Dg Shoulder Right  Result Date: 12/27/2017 CLINICAL DATA:  RIGHT shoulder pain for 5 days, possible repetitive stress injury. EXAM: RIGHT SHOULDER - 2 VIEW; RIGHT HUMERUS - 2+ VIEW COMPARISON:  None. FINDINGS: RIGHT shoulder: The humeral head is well-formed and located. The subacromial, glenohumeral and acromioclavicular joint spaces are intact. No destructive bony lesions. Soft tissue planes are  non-suspicious. RIGHT humerus: No acute fracture deformity or dislocation. No destructive bony lesions. Soft tissue planes are not suspicious. IMPRESSION: Negative. Electronically Signed   By: Elon Alas M.D.   On: 12/27/2017 14:56   Dg Humerus Right  Result Date: 12/27/2017 CLINICAL DATA:  RIGHT shoulder pain for 5 days, possible repetitive stress injury. EXAM: RIGHT SHOULDER - 2 VIEW; RIGHT HUMERUS - 2+ VIEW COMPARISON:  None. FINDINGS: RIGHT shoulder: The humeral head is well-formed and located. The subacromial, glenohumeral and acromioclavicular joint spaces are intact. No destructive bony lesions. Soft tissue planes are non-suspicious. RIGHT humerus: No acute fracture deformity or dislocation. No destructive bony lesions. Soft tissue planes are not suspicious. IMPRESSION: Negative. Electronically Signed   By: Elon Alas M.D.   On: 12/27/2017 14:56    Procedures .Joint Aspiration/Arthrocentesis Date/Time: 12/27/2017 8:10 PM Performed by: Joanne Gavel, PA-C Authorized by: Joanne Gavel, PA-C   Consent:    Consent obtained:  Verbal   Consent given by:  Patient   Risks discussed:  Bleeding, infection and pain   Alternatives discussed:  No treatment and alternative treatment Location:    Location:  Shoulder   Shoulder:  R glenohumeral Anesthesia (see MAR for exact dosages):    Anesthesia method:  Local infiltration   Local anesthetic:  Lidocaine 1% w/o epi Procedure details:    Needle gauge:  18 G   Ultrasound guidance: no     Approach:  Posterior   Aspirate amount:  <1 cc   Aspirate characteristics:  Clear   Steroid injected: no     Specimen collected: no   Post-procedure details:    Dressing:  Adhesive bandage   Patient tolerance of procedure:  Tolerated well, no immediate complications .Critical Care Performed by: Joanne Gavel, PA-C Authorized by: Joanne Gavel, PA-C   Critical care provider statement:    Critical care time (minutes):  55    Critical care was necessary to treat  or prevent imminent or life-threatening deterioration of the following conditions:  Sepsis   Critical care was time spent personally by me on the following activities:  Ordering and performing treatments and interventions, ordering and review of laboratory studies, pulse oximetry, ordering and review of radiographic studies, re-evaluation of patient's condition, review of old charts, development of treatment plan with patient or surrogate, evaluation of patient's response to treatment, obtaining history from patient or surrogate, discussions with consultants and examination of patient   (including critical care time)  Medications Ordered in ED Medications  vancomycin (VANCOCIN) 2,000 mg in sodium chloride 0.9 % 500 mL IVPB (2,000 mg Intravenous New Bag/Given 12/27/17 1840)  HYDROmorphone (DILAUDID) injection 1 mg (has no administration in time range)  sodium chloride 0.9 % bolus 1,000 mL (has no administration in time range)  enoxaparin (LOVENOX) injection 40 mg (has no administration in time range)  sodium chloride flush (NS) 0.9 % injection 3 mL (has no administration in time range)  0.9 %  sodium chloride infusion (has no administration in time range)  ondansetron (ZOFRAN) injection 4 mg (has no administration in time range)  acetaminophen (TYLENOL) tablet 650 mg (has no administration in time range)    Or  acetaminophen (TYLENOL) suppository 650 mg (has no administration in time range)  albuterol (PROVENTIL) (2.5 MG/3ML) 0.083% nebulizer solution 2.5 mg (has no administration in time range)  metroNIDAZOLE (FLAGYL) IVPB 500 mg (has no administration in time range)  dicyclomine (BENTYL) tablet 20 mg (has no administration in time range)  hydrOXYzine (ATARAX/VISTARIL) tablet 25 mg (has no administration in time range)  loperamide (IMODIUM) capsule 2-4 mg (has no administration in time range)  methocarbamol (ROBAXIN) tablet 500 mg (has no administration in  time range)  naproxen (NAPROSYN) tablet 500 mg (has no administration in time range)  ondansetron (ZOFRAN-ODT) disintegrating tablet 4 mg (has no administration in time range)  cloNIDine (CATAPRES) tablet 0.1 mg (has no administration in time range)    Followed by  cloNIDine (CATAPRES) tablet 0.1 mg (has no administration in time range)    Followed by  cloNIDine (CATAPRES) tablet 0.1 mg (has no administration in time range)  ketorolac (TORADOL) 30 MG/ML injection 30 mg (has no administration in time range)  ceFEPIme (MAXIPIME) 2 g in sodium chloride 0.9 % 100 mL IVPB (has no administration in time range)  acetaminophen (TYLENOL) tablet 650 mg (650 mg Oral Given 12/27/17 1407)  HYDROmorphone (DILAUDID) injection 1 mg (1 mg Intramuscular Given 12/27/17 1606)  sodium chloride 0.9 % bolus 1,000 mL (1,000 mLs Intravenous New Bag/Given 12/27/17 1743)  HYDROmorphone (DILAUDID) injection 1 mg (1 mg Intravenous Given 12/27/17 1741)  acetaminophen (TYLENOL) tablet 325 mg (325 mg Oral Given 12/27/17 1751)  ceFEPIme (MAXIPIME) 2 g in sodium chloride 0.9 % 100 mL IVPB (0 g Intravenous Stopped 12/27/17 1840)  lidocaine (PF) (XYLOCAINE) 1 % injection 5 mL (5 mLs Infiltration Given 12/27/17 1819)     Initial Impression / Assessment and Plan / ED Course  I have reviewed the triage vital signs and the nursing notes.  Pertinent labs & imaging results that were available during my care of the patient were reviewed by me and considered in my medical decision making (see chart for details).  27 year old male with a history of heroin use, asthma, bipolar 1 disorder, and right axillary abscess that grew MRSA presenting with right shoulder pain for 5 days.  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  On exam, pain is out of proportion to exam of the right shoulder.  Range of motion is limited as the patient is cradling the joint  against his arm and refusing to move it.  The patient was discussed with Dr. Ellender Hose, attending physician.  Given his history of IV drug use and chills, I am concerned for septic joint.  X-ray of the right shoulder and humerus ordered by triage staff is unremarkable for bony destruction.  Will order labs including ESR and CRP.  Differential diagnosis includes septic right shoulder, biceps brachii tendinitis, and right shoulder bursitis.  ESR is 19.1.  WBC is 9.8.  CBC differential is pending.  He is mildly hyponatremic at 130.  Bilirubin is 4.1.  On arrival to the ED, temp was 98.6 with HR of 90, worsening to 101.2 and HR in the 120s.  The patient was given 650 mg of Tylenol by triage staff on arrival.  Another 325 mg has been added.  He is also been given Dilaudid for pain control as he appears exquisitely uncomfortable and an IV fluid bolus as he appears mildly dehydrated clinically.  Since the patient now meets code sepsis criteria, a code sepsis has been called.  Broad-spectrum antibiotics with vancomycin and cefepime have been initiated.   Consulted orthopedic surgery and spoke with Dr. Alma Friendly who plans to evaluate the patient in the ER.  Will attempt right shoulder arthrocentesis in the ER.   Clinical Course as of Dec 28 2026  Wed Dec 27, 2017  1911 Attempted arthrocentesis of the right shoulder with Dr. Johnney Killian. Dry tap. Patient is diaphoretic and warm to the touch. Will recheck temp. Consult to hospitalist has been place.    [MM]    Clinical Course User Index [MM] Kasiyah Platter, Laymond Purser, PA-C    Consulted the hospitalist team and Dr. Tamala Julian, hospitalist, will admit and that Ortho will see the patient. Appreciate Ortho consult.   The patient appears reasonably stabilized for admission considering the current resources, flow, and capabilities available in the ED at this time, and I doubt any other [facility] requiring further screening and/or treatment in the ED prior to admission.  Final Clinical  Impressions(s) / ED Diagnoses   Final diagnoses:  Sepsis, due to unspecified organism, unspecified whether acute organ dysfunction present [facility])    ED Discharge Orders    None       Joanne Gavel, PA-C 12/27/17 2028    Duffy Bruce, MD 12/28/17 [phone]

## 2017-12-27 NOTE — ED Triage Notes (Signed)
Pt arrives via [facility] from home. Pt reports right shoulder and arm pain for 5 days. Pt reports that this happened "a few years ago when he was in jail and it was MRSA" Pt reports that he has no wounds,lesions, or sores at this time. Pt denies injury.

## 2017-12-27 NOTE — ED Notes (Signed)
Walked to bathroom for sample

## 2017-12-27 NOTE — Progress Notes (Signed)
A consult was received from an ED physician for vancomycin and cefepime per pharmacy dosing.  The patient's profile has been reviewed for ht/wt/allergies/indication/available labs.   A one time order has been placed for vancomycin 2gm and cefepime 2gm.    Further antibiotics/pharmacy consults should be ordered by admitting physician if indicated.                       Thank you, Logan Hancock RPh 12/27/2017, 5:41 PM Pager [phone]

## 2017-12-27 NOTE — Progress Notes (Signed)
Pharmacy Antibiotic Note  Logan Hancock is a 27 y.o. male admitted on 12/27/2017 with cellulitis.  Pharmacy has been consulted for cefepime dosing. Arthrocentesis of R shoulder attempted in ED > dry tap.  Plan: Cefepime 2 gm IV x 1 given in ED then Cefepime 2 gm IV q8h for possible septic R shoulder vs painful bursitis.  If MRI negative will decrease dose to 1 gm q8h for cellulitis Flagyl 500 mg IV q8h ordered by MD F/u MRI results, renal fxn, clinical course, WBC, temp, culture data  Height: 6\' 4"  (193 cm) Weight: 230 lb (104.3 kg) IBW/kg (Calculated) : 86.8  Temp (24hrs), Avg:100.4 F (38 C), Min:98.6 F (37 C), Max:102.9 F (39.4 C)  Recent Labs  Lab 12/27/17 1619 12/27/17 1630  WBC 9.8  --   CREATININE 0.89  --   LATICACIDVEN  --  1.18    Estimated Creatinine Clearance: 168.3 mL/min (by C-G formula based on SCr of 0.89 mg/dL).    No Known Allergies  Antimicrobials this admission: 12/18 vanc 2 gm x 1 12/18 cefepime>> 12/18 flagyl>>  Dose adjustments this admission:  Microbiology results: 12/18 BCx2>> 12/18 HIV>>  Thank you for allowing pharmacy to be a part of this patient's care.  Herby AbrahamMichelle T. [place] Espina, Pharm.D [phone] 12/27/2017 8:14 PM

## 2017-12-28 ENCOUNTER — Inpatient Hospital Stay (HOSPITAL_COMMUNITY): Payer: Self-pay | Admitting: Certified Registered"

## 2017-12-28 ENCOUNTER — Encounter (HOSPITAL_COMMUNITY)
Admission: EM | Disposition: A | Discharge status: Home / Self Care | Payer: Self-pay | Source: Home / Self Care | Attending: Internal Medicine

## 2017-12-28 ENCOUNTER — Inpatient Hospital Stay (HOSPITAL_COMMUNITY): Payer: Self-pay

## 2017-12-28 ENCOUNTER — Other Ambulatory Visit: Payer: Self-pay

## 2017-12-28 ENCOUNTER — Encounter (HOSPITAL_COMMUNITY): Payer: Self-pay | Admitting: Certified Registered"

## 2017-12-28 DIAGNOSIS — B9561 Methicillin susceptible Staphylococcus aureus infection as the cause of diseases classified elsewhere: Secondary | ICD-10-CM

## 2017-12-28 DIAGNOSIS — M009 Pyogenic arthritis, unspecified: Secondary | ICD-10-CM | POA: Diagnosis present

## 2017-12-28 DIAGNOSIS — F17210 Nicotine dependence, cigarettes, uncomplicated: Secondary | ICD-10-CM

## 2017-12-28 DIAGNOSIS — R7881 Bacteremia: Secondary | ICD-10-CM

## 2017-12-28 DIAGNOSIS — M00019 Staphylococcal arthritis, unspecified shoulder: Secondary | ICD-10-CM

## 2017-12-28 DIAGNOSIS — F1990 Other psychoactive substance use, unspecified, uncomplicated: Secondary | ICD-10-CM

## 2017-12-28 DIAGNOSIS — E871 Hypo-osmolality and hyponatremia: Secondary | ICD-10-CM

## 2017-12-28 HISTORY — PX: SHOULDER ARTHROSCOPY: SHX128

## 2017-12-28 LAB — CBC
HCT: 39.8 % (ref 39.0–52.0)
Hemoglobin: 12.7 g/dL — ABNORMAL LOW (ref 13.0–17.0)
MCH: 26.2 pg (ref 26.0–34.0)
MCHC: 31.9 g/dL (ref 30.0–36.0)
MCV: 82.2 fL (ref 80.0–100.0)
NRBC: 0 % (ref 0.0–0.2)
PLATELETS: 156 10*3/uL (ref 150–400)
RBC: 4.84 MIL/uL (ref 4.22–5.81)
RDW: 16 % — AB (ref 11.5–15.5)
WBC: 16.3 10*3/uL — ABNORMAL HIGH (ref 4.0–10.5)

## 2017-12-28 LAB — BLOOD CULTURE ID PANEL (REFLEXED)
Acinetobacter baumannii: NOT DETECTED
CANDIDA ALBICANS: NOT DETECTED
CANDIDA KRUSEI: NOT DETECTED
Candida glabrata: NOT DETECTED
Candida parapsilosis: NOT DETECTED
Candida tropicalis: NOT DETECTED
ESCHERICHIA COLI: NOT DETECTED
Enterobacter cloacae complex: NOT DETECTED
Enterobacteriaceae species: NOT DETECTED
Enterococcus species: NOT DETECTED
Haemophilus influenzae: NOT DETECTED
Klebsiella oxytoca: NOT DETECTED
Klebsiella pneumoniae: NOT DETECTED
Listeria monocytogenes: NOT DETECTED
Methicillin resistance: NOT DETECTED
Neisseria meningitidis: NOT DETECTED
Proteus species: NOT DETECTED
Pseudomonas aeruginosa: NOT DETECTED
Serratia marcescens: NOT DETECTED
Staphylococcus aureus (BCID): DETECTED — AB
Staphylococcus species: DETECTED — AB
Streptococcus agalactiae: NOT DETECTED
Streptococcus pneumoniae: NOT DETECTED
Streptococcus pyogenes: NOT DETECTED
Streptococcus species: NOT DETECTED

## 2017-12-28 LAB — BASIC METABOLIC PANEL
Anion gap: 14 (ref 5–15)
BUN: 31 mg/dL — ABNORMAL HIGH (ref 6–20)
CO2: 15 mmol/L — ABNORMAL LOW (ref 22–32)
Calcium: 6.9 mg/dL — ABNORMAL LOW (ref 8.9–10.3)
Chloride: 102 mmol/L (ref 98–111)
Creatinine, Ser: 2.42 mg/dL — ABNORMAL HIGH (ref 0.61–1.24)
GFR calc Af Amer: 41 mL/min — ABNORMAL LOW (ref >60)
GFR, EST NON AFRICAN AMERICAN: 36 mL/min — AB (ref >60)
Glucose, Bld: 127 mg/dL — ABNORMAL HIGH (ref 70–99)
POTASSIUM: 4.3 mmol/L (ref 3.5–5.1)
Sodium: 131 mmol/L — ABNORMAL LOW (ref 135–145)

## 2017-12-28 LAB — ECHOCARDIOGRAM COMPLETE
HEIGHTINCHES: 76 in
Weight: 3680 oz

## 2017-12-28 SURGERY — ARTHROSCOPY, SHOULDER
Anesthesia: General | Site: Shoulder | Laterality: Right

## 2017-12-28 MED ORDER — SUGAMMADEX SODIUM 200 MG/2ML IV SOLN
INTRAVENOUS | Status: AC
  Filled 2017-12-28: qty 2

## 2017-12-28 MED ORDER — OXYCODONE HCL 5 MG PO TABS
5.0000 mg | ORAL_TABLET | ORAL | Status: DC | PRN
  Administered 2017-12-28 (×2): 10 mg via ORAL
  Administered 2017-12-28: 5 mg via ORAL
  Administered 2017-12-28 – 2018-01-11 (×35): 10 mg via ORAL
  Filled 2017-12-28 (×37): qty 2
  Filled 2017-12-28: qty 1
  Filled 2017-12-28 (×2): qty 2

## 2017-12-28 MED ORDER — ALBUTEROL SULFATE HFA 108 (90 BASE) MCG/ACT IN AERS: 1.0000 | INHALATION_SPRAY | Freq: Four times a day (QID) | RESPIRATORY_TRACT | Status: DC | PRN

## 2017-12-28 MED ORDER — HYDROMORPHONE HCL 1 MG/ML IJ SOLN
0.2500 mg | INTRAMUSCULAR | Status: DC | PRN
  Administered 2017-12-28: 0.5 mg via INTRAVENOUS

## 2017-12-28 MED ORDER — METHOCARBAMOL 500 MG PO TABS
500.0000 mg | ORAL_TABLET | Freq: Four times a day (QID) | ORAL | Status: DC | PRN
  Administered 2017-12-28 – 2018-01-10 (×23): 500 mg via ORAL
  Filled 2017-12-28 (×21): qty 1

## 2017-12-28 MED ORDER — HYDROMORPHONE HCL 1 MG/ML IJ SOLN
0.5000 mg | INTRAMUSCULAR | Status: DC | PRN
  Administered 2017-12-28 – 2018-01-03 (×7): 1 mg via INTRAVENOUS
  Administered 2018-01-03: 0.5 mg via INTRAVENOUS
  Administered 2018-01-03 – 2018-01-04 (×5): 1 mg via INTRAVENOUS
  Administered 2018-01-04: 0.5 mg via INTRAVENOUS
  Administered 2018-01-04: 1 mg via INTRAVENOUS
  Administered 2018-01-05: 0.5 mg via INTRAVENOUS
  Administered 2018-01-05: 1 mg via INTRAVENOUS
  Administered 2018-01-05: 0.5 mg via INTRAVENOUS
  Administered 2018-01-05 (×2): 1 mg via INTRAVENOUS
  Administered 2018-01-05: 0.5 mg via INTRAVENOUS
  Administered 2018-01-06 – 2018-01-10 (×26): 1 mg via INTRAVENOUS
  Filled 2017-12-28 (×44): qty 1

## 2017-12-28 MED ORDER — LAMOTRIGINE 100 MG PO TABS: 100.0000 mg | ORAL_TABLET | Freq: Every evening | ORAL | Status: DC

## 2017-12-28 MED ORDER — SUGAMMADEX SODIUM 200 MG/2ML IV SOLN
INTRAVENOUS | Status: DC | PRN
  Administered 2017-12-28: 200 mg via INTRAVENOUS

## 2017-12-28 MED ORDER — HYDRALAZINE HCL 20 MG/ML IJ SOLN: 10.0000 mg | INTRAMUSCULAR | Status: DC | PRN

## 2017-12-28 MED ORDER — DIVALPROEX SODIUM 500 MG PO DR TAB: 1000.0000 mg | DELAYED_RELEASE_TABLET | Freq: Every evening | ORAL | Status: DC

## 2017-12-28 MED ORDER — MIRTAZAPINE 15 MG PO TABS
15.0000 mg | ORAL_TABLET | Freq: Every day | ORAL | Status: DC
  Administered 2017-12-28 – 2018-01-10 (×14): 15 mg via ORAL
  Filled 2017-12-28 (×14): qty 1

## 2017-12-28 MED ORDER — PROPOFOL 10 MG/ML IV BOLUS
INTRAVENOUS | Status: DC | PRN
  Administered 2017-12-28: 150 mg via INTRAVENOUS

## 2017-12-28 MED ORDER — DOCUSATE SODIUM 100 MG PO CAPS
100.0000 mg | ORAL_CAPSULE | Freq: Two times a day (BID) | ORAL | Status: DC
  Administered 2017-12-28 – 2018-01-11 (×26): 100 mg via ORAL
  Filled 2017-12-28 (×30): qty 1

## 2017-12-28 MED ORDER — EPINEPHRINE PF 1 MG/ML IJ SOLN
INTRAMUSCULAR | Status: AC
  Filled 2017-12-28: qty 1

## 2017-12-28 MED ORDER — METOCLOPRAMIDE HCL 5 MG/ML IJ SOLN: 5.0000 mg | Freq: Three times a day (TID) | INTRAMUSCULAR | Status: DC | PRN

## 2017-12-28 MED ORDER — HYDROXYZINE HCL 25 MG PO TABS
25.0000 mg | ORAL_TABLET | Freq: Four times a day (QID) | ORAL | Status: DC | PRN
  Administered 2017-12-30 – 2018-01-06 (×5): 25 mg via ORAL
  Filled 2017-12-28 (×6): qty 1

## 2017-12-28 MED ORDER — METHOCARBAMOL 500 MG IVPB - SIMPLE MED
INTRAVENOUS | Status: AC
  Filled 2017-12-28: qty 50

## 2017-12-28 MED ORDER — ROCURONIUM BROMIDE 10 MG/ML (PF) SYRINGE
PREFILLED_SYRINGE | INTRAVENOUS | Status: AC
  Filled 2017-12-28: qty 10

## 2017-12-28 MED ORDER — ONDANSETRON HCL 4 MG/2ML IJ SOLN
INTRAMUSCULAR | Status: DC | PRN
  Administered 2017-12-28: 4 mg via INTRAVENOUS

## 2017-12-28 MED ORDER — HYDROMORPHONE HCL 1 MG/ML IJ SOLN
INTRAMUSCULAR | Status: AC
  Filled 2017-12-28: qty 1

## 2017-12-28 MED ORDER — FENTANYL CITRATE (PF) 100 MCG/2ML IJ SOLN
25.0000 ug | INTRAMUSCULAR | Status: DC | PRN
  Administered 2017-12-28: 50 ug via INTRAVENOUS
  Administered 2017-12-28 (×2): 25 ug via INTRAVENOUS

## 2017-12-28 MED ORDER — METOCLOPRAMIDE HCL 5 MG PO TABS: 5.0000 mg | ORAL_TABLET | Freq: Three times a day (TID) | ORAL | Status: DC | PRN

## 2017-12-28 MED ORDER — METHOCARBAMOL 500 MG IVPB - SIMPLE MED
500.0000 mg | Freq: Four times a day (QID) | INTRAVENOUS | Status: DC | PRN
  Administered 2017-12-28 (×2): 500 mg via INTRAVENOUS
  Filled 2017-12-28: qty 50

## 2017-12-28 MED ORDER — FENTANYL CITRATE (PF) 100 MCG/2ML IJ SOLN
INTRAMUSCULAR | Status: AC
  Filled 2017-12-28: qty 2

## 2017-12-28 MED ORDER — FENTANYL CITRATE (PF) 250 MCG/5ML IJ SOLN
INTRAMUSCULAR | Status: AC
  Filled 2017-12-28: qty 5

## 2017-12-28 MED ORDER — LIDOCAINE 2% (20 MG/ML) 5 ML SYRINGE
INTRAMUSCULAR | Status: DC | PRN
  Administered 2017-12-28: 100 mg via INTRAVENOUS

## 2017-12-28 MED ORDER — CEFAZOLIN SODIUM-DEXTROSE 2-4 GM/100ML-% IV SOLN
2.0000 g | Freq: Three times a day (TID) | INTRAVENOUS | Status: DC
  Administered 2017-12-28 – 2018-01-11 (×41): 2 g via INTRAVENOUS
  Filled 2017-12-28 (×41): qty 100

## 2017-12-28 MED ORDER — FENTANYL CITRATE (PF) 100 MCG/2ML IJ SOLN
INTRAMUSCULAR | Status: DC | PRN
  Administered 2017-12-28: 50 ug via INTRAVENOUS
  Administered 2017-12-28 (×2): 100 ug via INTRAVENOUS

## 2017-12-28 MED ORDER — ONDANSETRON HCL 4 MG/2ML IJ SOLN
INTRAMUSCULAR | Status: AC
  Filled 2017-12-28: qty 2

## 2017-12-28 MED ORDER — BISACODYL 10 MG RE SUPP
10.0000 mg | Freq: Every day | RECTAL | Status: DC | PRN
  Filled 2017-12-28: qty 1

## 2017-12-28 MED ORDER — PROPOFOL 10 MG/ML IV BOLUS
INTRAVENOUS | Status: AC
  Filled 2017-12-28: qty 20

## 2017-12-28 MED ORDER — ONDANSETRON HCL 4 MG PO TABS
4.0000 mg | ORAL_TABLET | Freq: Four times a day (QID) | ORAL | Status: DC | PRN
  Filled 2017-12-28: qty 1

## 2017-12-28 MED ORDER — LACTATED RINGERS IR SOLN
Status: DC | PRN
  Administered 2017-12-28: 6000 mL

## 2017-12-28 MED ORDER — MIDAZOLAM HCL 2 MG/2ML IJ SOLN
INTRAMUSCULAR | Status: AC
  Filled 2017-12-28: qty 2

## 2017-12-28 MED ORDER — SODIUM CHLORIDE 0.9 % IV SOLN
INTRAVENOUS | Status: DC
  Administered 2017-12-28 – 2017-12-29 (×2): via INTRAVENOUS

## 2017-12-28 MED ORDER — SUCCINYLCHOLINE CHLORIDE 200 MG/10ML IV SOSY
PREFILLED_SYRINGE | INTRAVENOUS | Status: AC
  Filled 2017-12-28: qty 10

## 2017-12-28 MED ORDER — PHENYLEPHRINE 40 MCG/ML (10ML) SYRINGE FOR IV PUSH (FOR BLOOD PRESSURE SUPPORT)
PREFILLED_SYRINGE | INTRAVENOUS | Status: DC | PRN
  Administered 2017-12-28 (×2): 80 ug via INTRAVENOUS

## 2017-12-28 MED ORDER — LIDOCAINE 2% (20 MG/ML) 5 ML SYRINGE
INTRAMUSCULAR | Status: AC
  Filled 2017-12-28: qty 5

## 2017-12-28 MED ORDER — PROMETHAZINE HCL 25 MG/ML IJ SOLN: 6.2500 mg | INTRAMUSCULAR | Status: DC | PRN

## 2017-12-28 MED ORDER — MIDAZOLAM HCL 5 MG/5ML IJ SOLN
INTRAMUSCULAR | Status: DC | PRN
  Administered 2017-12-28: 2 mg via INTRAVENOUS

## 2017-12-28 MED ORDER — LORAZEPAM 2 MG/ML IJ SOLN: 0.5000 mg | INTRAMUSCULAR | Status: DC | PRN

## 2017-12-28 MED ORDER — PHENYLEPHRINE 40 MCG/ML (10ML) SYRINGE FOR IV PUSH (FOR BLOOD PRESSURE SUPPORT)
PREFILLED_SYRINGE | INTRAVENOUS | Status: AC
  Filled 2017-12-28: qty 10

## 2017-12-28 MED ORDER — ROCURONIUM BROMIDE 10 MG/ML (PF) SYRINGE
PREFILLED_SYRINGE | INTRAVENOUS | Status: DC | PRN
  Administered 2017-12-28: 30 mg via INTRAVENOUS

## 2017-12-28 MED ORDER — ONDANSETRON HCL 4 MG/2ML IJ SOLN: 4.0000 mg | Freq: Four times a day (QID) | INTRAMUSCULAR | Status: DC | PRN

## 2017-12-28 MED ORDER — PHENOL 1.4 % MT LIQD: 1.0000 | OROMUCOSAL | Status: DC | PRN

## 2017-12-28 MED ORDER — TRAZODONE HCL 50 MG PO TABS: 50.0000 mg | ORAL_TABLET | Freq: Every evening | ORAL | Status: DC | PRN

## 2017-12-28 MED ORDER — SUCCINYLCHOLINE CHLORIDE 200 MG/10ML IV SOSY
PREFILLED_SYRINGE | INTRAVENOUS | Status: DC | PRN
  Administered 2017-12-28: 100 mg via INTRAVENOUS

## 2017-12-28 MED ORDER — BUPIVACAINE-EPINEPHRINE (PF) 0.25% -1:200000 IJ SOLN
INTRAMUSCULAR | Status: AC
  Filled 2017-12-28: qty 30

## 2017-12-28 MED ORDER — BUPROPION HCL ER (XL) 300 MG PO TB24
300.0000 mg | ORAL_TABLET | Freq: Every day | ORAL | Status: DC
  Administered 2017-12-28 – 2018-01-11 (×14): 300 mg via ORAL
  Filled 2017-12-28 (×15): qty 1

## 2017-12-28 MED ORDER — OXYCODONE HCL 5 MG PO TABS
ORAL_TABLET | ORAL | Status: AC
  Filled 2017-12-28: qty 2

## 2017-12-28 MED ORDER — ACETAMINOPHEN 325 MG PO TABS
325.0000 mg | ORAL_TABLET | Freq: Four times a day (QID) | ORAL | Status: DC | PRN
  Administered 2017-12-28 – 2018-01-10 (×12): 650 mg via ORAL
  Filled 2017-12-28 (×12): qty 2

## 2017-12-28 MED ORDER — MENTHOL 3 MG MT LOZG
1.0000 | LOZENGE | OROMUCOSAL | Status: DC | PRN
  Filled 2017-12-28: qty 9

## 2017-12-28 SURGICAL SUPPLY — 33 items
BLADE 4.2CUDA (BLADE) ×2 IMPLANT
BLADE SURG SZ11 CARB STEEL (BLADE) ×3 IMPLANT
BOOTIES KNEE HIGH SLOAN (MISCELLANEOUS) ×6 IMPLANT
BUR OVAL 4.0 (BURR) IMPLANT
COVER SURGICAL LIGHT HANDLE (MISCELLANEOUS) ×3 IMPLANT
COVER WAND RF STERILE (DRAPES) IMPLANT
DECANTER SPIKE VIAL GLASS SM (MISCELLANEOUS) ×3 IMPLANT
DRAPE SHOULDER BEACH CHAIR (DRAPES) ×3 IMPLANT
DRAPE U-SHAPE 47X51 STRL (DRAPES) ×3 IMPLANT
DRSG EMULSION OIL 3X3 NADH (GAUZE/BANDAGES/DRESSINGS) ×3 IMPLANT
DRSG PAD ABDOMINAL 8X10 ST (GAUZE/BANDAGES/DRESSINGS) IMPLANT
DURAPREP 26ML APPLICATOR (WOUND CARE) ×3 IMPLANT
GAUZE SPONGE 4X4 12PLY STRL (GAUZE/BANDAGES/DRESSINGS) ×2 IMPLANT
GLOVE ORTHO TXT STRL SZ7.5 (GLOVE) ×3 IMPLANT
GLOVE SURG ORTHO 8.5 STRL (GLOVE) ×3 IMPLANT
GOWN STRL REUS W/TWL LRG LVL3 (GOWN DISPOSABLE) ×6 IMPLANT
KIT BASIN OR (CUSTOM PROCEDURE TRAY) ×6 IMPLANT
MANIFOLD NEPTUNE II (INSTRUMENTS) ×6 IMPLANT
NDL SPNL 18GX3.5 QUINCKE PK (NEEDLE) ×1 IMPLANT
NEEDLE SPNL 18GX3.5 QUINCKE PK (NEEDLE) IMPLANT
PACK SHOULDER (CUSTOM PROCEDURE TRAY) ×3 IMPLANT
PAD ABD 8X10 STRL (GAUZE/BANDAGES/DRESSINGS) ×2 IMPLANT
PROTECTOR NERVE ULNAR (MISCELLANEOUS) ×3 IMPLANT
SLING ARM FOAM STRAP XLG (SOFTGOODS) ×2 IMPLANT
SLING ARM IMMOBILIZER LRG (SOFTGOODS) IMPLANT
SLING ARM IMMOBILIZER MED (SOFTGOODS) ×1 IMPLANT
SUT ETHILON 2 0 PS N (SUTURE) ×2 IMPLANT
SUT ETHILON 4 0 PS 2 18 (SUTURE) ×3 IMPLANT
TAPE CLOTH SURG 4X10 WHT LF (GAUZE/BANDAGES/DRESSINGS) ×2 IMPLANT
TOWEL OR 17X26 10 PK STRL BLUE (TOWEL DISPOSABLE) ×6 IMPLANT
TUBING ARTHRO INFLOW-ONLY STRL (TUBING) ×3 IMPLANT
TUBING CONNECTING 10 (TUBING) ×2 IMPLANT
TUBING CONNECTING 10' (TUBING) ×1

## 2017-12-28 NOTE — Progress Notes (Signed)
MRI results show likely right shoulder AC joint sepsis.  No obvious shoulder glenohumeral joint effusion. Due to the infection involving a joint urgent/emergent I+D required. Will obtain surgical cultures. Recommend ID consult as these infections can be polymicrobial and gram negative organisms in addition to Staph and Strep  Malon KindleSteven Merrel Crabbe, MD

## 2017-12-28 NOTE — Progress Notes (Signed)
PHARMACY - PHYSICIAN COMMUNICATION CRITICAL VALUE ALERT - BLOOD CULTURE IDENTIFICATION (BCID)  Logan Hancock is an 27 y.o. male who [facility] with a chief complaint of shoulder pain  Assessment:  septic shoulder  Name of physician (or Provider) Contacted: Dr Hanley BenAlekh  Current antibiotics: cefepime and metronidazole  Changes to prescribed antibiotics recommended:  None - ID already aware of patient.    Results for orders placed or performed during the hospital encounter of 12/27/17  Blood Culture ID Panel (Reflexed) (Collected: 12/27/2017  4:19 PM)  Result Value Ref Range   Enterococcus species NOT DETECTED NOT DETECTED   Listeria monocytogenes NOT DETECTED NOT DETECTED   Staphylococcus species DETECTED (A) NOT DETECTED   Staphylococcus aureus (BCID) DETECTED (A) NOT DETECTED   Methicillin resistance NOT DETECTED NOT DETECTED   Streptococcus species NOT DETECTED NOT DETECTED   Streptococcus agalactiae NOT DETECTED NOT DETECTED   Streptococcus pneumoniae NOT DETECTED NOT DETECTED   Streptococcus pyogenes NOT DETECTED NOT DETECTED   Acinetobacter baumannii NOT DETECTED NOT DETECTED   Enterobacteriaceae species NOT DETECTED NOT DETECTED   Enterobacter cloacae complex NOT DETECTED NOT DETECTED   Escherichia coli NOT DETECTED NOT DETECTED   Klebsiella oxytoca NOT DETECTED NOT DETECTED   Klebsiella pneumoniae NOT DETECTED NOT DETECTED   Proteus species NOT DETECTED NOT DETECTED   Serratia marcescens NOT DETECTED NOT DETECTED   Haemophilus influenzae NOT DETECTED NOT DETECTED   Neisseria meningitidis NOT DETECTED NOT DETECTED   Pseudomonas aeruginosa NOT DETECTED NOT DETECTED   Candida albicans NOT DETECTED NOT DETECTED   Candida glabrata NOT DETECTED NOT DETECTED   Candida krusei NOT DETECTED NOT DETECTED   Candida parapsilosis NOT DETECTED NOT DETECTED   Candida tropicalis NOT DETECTED NOT DETECTED    Juliette Alcideustin Fredrico Beedle, PharmD, BCPS.   Work Cell:  [phone] 12/28/2017 11:37 AM

## 2017-12-28 NOTE — Op Note (Signed)
NAMBasilia Jumbo: Hancock, Teyon K. MEDICAL RECORD [id number] ACCOUNT [id number] DATE OF BIRTH:November 28, 1990 FACILITY: [facility] LOCATION: [facility] PHYSICIAN:STEVEN Russ Halo. Lataya Varnell, MD  OPERATIVE REPORT  DATE OF PROCEDURE:  12/28/2017  PREOPERATIVE DIAGNOSIS:  Right shoulder septic acromioclavicular joint; rule out septic shoulder joint  preoperative well.    POSTOPERATIVE DIAGNOSIS:  Right shoulder septic acromioclavicular joint; rule out septic shoulder joint  preoperative well.   PROCEDURE PERFORMED:  Right shoulder arthroscopic incision and drainage of the shoulder joint followed by open incision and drainage of an acromioclavicular joint and obtaining of deep tissue cultures from the shoulder.  ATTENDING SURGEON:  Malon KindleSteven Canary Fister, MD  ASSISTANT:  None.  ANESTHESIA:  General anesthesia was used.  ESTIMATED BLOOD LOSS:  Less than 50 mL.  FLUID REPLACEMENT:  1500 mL crystalloid.  INSTRUMENT COUNTS:  Correct.  COMPLICATIONS:  No complications.  ANTIBIOTICS:  Perioperative antibiotics were given.  INDICATIONS:  The patient is a 27 year old male who presents with a history of worsening right shoulder pain.  The patient is an IV drug abuser and he presents with tachycardia and also an elevated white count and CRP.  Preoperative imaging MRI scan  indicating a potential septic AC joint.  Due to his constellation of symptoms, his tachycardia, his severe shoulder pain and evidence of deep infection on MRI scan.  We counseled the patient regarding a recommendation for urgent/emergent I and D of the  shoulder joint and his AC joint.  Risks and benefits of surgical treatment discussed, informed consent obtained.  DESCRIPTION OF PROCEDURE:  After adequate level of anesthesia was achieved, the patient was positioned in modified beach chair position.  Right shoulder correctly identified and sterilely prepped and draped in the usual manner.  Timeout was called,  verifying correct patient, correct site.  We entered the  shoulder joint using standard arthroscopic portals including anterior and posterior portals.  We identified synovitis within the shoulder joint, but no clear pus.  We did 4 L of normal saline  irrigation through the shoulder joint.  There were no tears in the rotator cuff were visualized.  Articular cartilage was in good shape.  The biceps anchor was stable after doing a very thorough lavage of the shoulder joint with 4 liters normal saline.   We moved to conclude the arthroscopy, moved to the [facility] debridement.  We did a small saber incision overlying the AC joint.  Dissection down through subcutaneous tissues using Bovie.  We identified the deltotrapezial fascia,  incised in line with  distal clavicle.  Subperiosteal dissection distal clavicle performed using a Therapist, nutritionalreer elevator.  We entered the [facility] joint.  There was advanced degeneration within that.  There was some clear fluid, but no frank pus.  At this point, we irrigated with 3 liters  normal saline irrigation and sharp debridement with rongeurs and after we had completed our thorough irrigation and debridement, we went ahead and closed with interrupted #1 PDS closure for the deltotrapezial fascia followed by interrupted full  thickness nylon closure for the skin and portals.  Sterile dressing applied followed by a shoulder sling.    The patient transported to recovery room in stable condition.  AN/NUANCE  D:12/28/2017 T:12/28/2017 JOB:[id number]

## 2017-12-28 NOTE — Progress Notes (Signed)
Patient ID: Logan Hancock, male   DOB: [birth year], 27 y.o.   MRN: [id number]  PROGRESS NOTE    VALERIO PINARD  NWG:[id number] DOB: [birth year] DOA: 12/27/2017 PCP: Patient, No Pcp Per   Brief Narrative:  27 year old male with history of polysubstance abuse including cocaine, IV heroin and marijuana; bipolar disorder presented with 5 days of right shoulder pain and swelling.  He was found to be febrile in the ED with elevated CRP.  Orthopedics was consulted for probable septic right shoulder.  MRI revealed signs concerning for septic arthritis.  Patient was started on antibiotics.   Assessment & Plan:   Active Problems:   Polysubstance abuse (HCC)   Heroin use disorder, severe (HCC)   Sepsis (HCC)   Septic arthritis of shoulder, right (HCC)   Hyponatremia   Infection of shoulder (HCC)  Sepsis secondary to septic joint -Antibiotics plan as below.  Continue IV fluids.  Blood pressure on the lower side.  Monitor  Right acromioclavicular joint septic arthritis -Confirmed by MRI. -Continue broad-spectrum antibiotics -Orthopedics following: Status post I&D today.  Follow OR cultures.  Orthopedics recommending ID consultation.  Will consult ID  Leukocytosis -Probably secondary to above.  Monitor  Acute kidney injury  -Probably secondary to above.  Repeat a.m. labs.  Continue IV fluids  Staph bacteremia -BCID suggests MSSA bacteremia.  Follow cultures and sensitivities.  Will repeat blood cultures in a.m. -We will get 2D echo.  Polysubstance abuse  -Reports recent use of heroin and methamphetamines -Monitor for withdrawal.  Continue opiate withdrawal protocol -Social worker consult for substance abuse  Hypo-natremia/hypochloremia -Continue IV fluids.  Monitor  Bipolar disorder -Patient apparently is no longer on medications for bipolar disorder  History of asthma -Albuterol as needed.   DVT prophylaxis: Lovenox Code Status: Full Family Communication: None at  bedside Disposition Plan: Depends on clinical outcome  Consultants: Orthopedics.  Called ID  Procedures: I&D of right acromioclavicular joint  Antimicrobials: Cefepime, Flagyl, vancomycin from 12/27/2017 onwards   Subjective: Patient seen and examined at bedside in PACU.  Slightly drowsy, complains of right shoulder pain.  Objective: Vitals:   12/28/17 1000 12/28/17 1015 12/28/17 1030 12/28/17 1039  BP: (!) 92/53 (!) 94/45 (!) 95/50 (!) 86/60  Pulse: (!) 117 (!) 114  (!) 111  Resp: (!) 29 (!) 26 (!) 28 16  Temp: (!) 100.4 F (38 C)   98.5 F (36.9 C)  TempSrc:      SpO2: 97% 100%  100%  Weight:      Height:        Intake/Output Summary (Last 24 hours) at 12/28/2017 1120 Last data filed at 12/28/2017 0620 Gross per 24 hour  Intake 1100 ml  Output -  Net 1100 ml   Filed Weights   12/27/17 1407  Weight: 104.3 kg    Examination:  General exam: Appears drowsy.  No acute distress Respiratory system: Bilateral decreased breath sounds at bases Cardiovascular system: S1 & S2 heard, Rate controlled Gastrointestinal system: Abdomen is nondistended, soft and nontender. Normal bowel sounds heard. Extremities: No cyanosis, clubbing, edema  Central nervous system: Drowsy. No focal neurological deficits. Moving extremities Skin: No rashes, lesions or ulcers Psychiatry: Could not be assessed because of mental status.     Data Reviewed: I have personally reviewed following labs and imaging studies  CBC: Recent Labs  Lab 12/27/17 1619 12/28/17 0920  WBC 9.8 16.3*  NEUTROABS 9.4*  --   HGB 14.3 12.7*  HCT 44.9 39.8  MCV 82.7 82.2  PLT 156 156   Basic Metabolic Panel: Recent Labs  Lab 12/27/17 1619 12/28/17 0920  NA 130* 131*  K 3.9 4.3  CL 94* 102  CO2 23 15*  GLUCOSE 125* 127*  BUN 11 31*  CREATININE 0.89 2.42*  CALCIUM 8.8* 6.9*   GFR: Estimated Creatinine Clearance: 61.9 mL/min (A) (by C-G formula based on SCr of 2.42 mg/dL (H)). Liver Function  Tests: Recent Labs  Lab 12/27/17 1619  AST 27  ALT 25  ALKPHOS 63  BILITOT 4.1*  PROT 7.6  ALBUMIN 4.0   No results for input(s): LIPASE, AMYLASE in the last 168 hours. No results for input(s): AMMONIA in the last 168 hours. Coagulation Profile: Recent Labs  Lab 12/27/17 1619  INR 1.10   Cardiac Enzymes: No results for input(s): CKTOTAL, CKMB, CKMBINDEX, TROPONINI in the last 168 hours. BNP (last 3 results) No results for input(s): PROBNP in the last 8760 hours. HbA1C: No results for input(s): HGBA1C in the last 72 hours. CBG: No results for input(s): GLUCAP in the last 168 hours. Lipid Profile: No results for input(s): CHOL, HDL, LDLCALC, TRIG, CHOLHDL, LDLDIRECT in the last 72 hours. Thyroid Function Tests: No results for input(s): TSH, T4TOTAL, FREET4, T3FREE, THYROIDAB in the last 72 hours. Anemia Panel: No results for input(s): VITAMINB12, FOLATE, FERRITIN, TIBC, IRON, RETICCTPCT in the last 72 hours. Sepsis Labs: Recent Labs  Lab 12/27/17 1630  LATICACIDVEN 1.18    Recent Results (from the past 240 hour(s))  Blood culture (routine x 2)     Status: None (Preliminary result)   Collection Time: 12/27/17  4:19 PM  Result Value Ref Range Status   Specimen Description   Final    BLOOD LEFT ANTECUBITAL Performed at [facility], [address] [place], [place] [address]    Special Requests   Final    BOTTLES DRAWN AEROBIC AND ANAEROBIC Blood Culture results may not be optimal due to an excessive volume of blood received in culture bottles Performed at [facility], [address] [place], [place] [address]    Culture  Setup Time   Final    GRAM POSITIVE COCCI IN CLUSTERS IN BOTH AEROBIC AND ANAEROBIC BOTTLES Organism ID to follow CRITICAL RESULT CALLED TO, READ BACK BY AND VERIFIED WITH: D. Wofford PharmD 10:25 12/28/17 ([email or web address]) Performed at [facility], [address] [place], [place] [address]    Culture GRAM  POSITIVE COCCI  Final   Report Status PENDING  Incomplete  Blood Culture ID Panel (Reflexed)     Status: Abnormal   Collection Time: 12/27/17  4:19 PM  Result Value Ref Range Status   Enterococcus species NOT DETECTED NOT DETECTED Final   Listeria monocytogenes NOT DETECTED NOT DETECTED Final   Staphylococcus species DETECTED (A) NOT DETECTED Final    Comment: CRITICAL RESULT CALLED TO, READ BACK BY AND VERIFIED WITH: D. Wofford PharmD 10:25 12/28/17 ([email or web address])    Staphylococcus aureus (BCID) DETECTED (A) NOT DETECTED Final    Comment: Methicillin (oxacillin) susceptible Staphylococcus aureus (MSSA). Preferred therapy is anti staphylococcal beta lactam antibiotic (Cefazolin or Nafcillin), unless clinically contraindicated. CRITICAL RESULT CALLED TO, READ BACK BY AND VERIFIED WITH: D. Wofford PharmD 10:25 12/28/17 ([email or web address])    Methicillin resistance NOT DETECTED NOT DETECTED Final   Streptococcus species NOT DETECTED NOT DETECTED Final   Streptococcus agalactiae NOT DETECTED NOT DETECTED Final   Streptococcus pneumoniae NOT DETECTED NOT DETECTED Final   Streptococcus pyogenes NOT DETECTED NOT DETECTED Final  Acinetobacter baumannii NOT DETECTED NOT DETECTED Final   Enterobacteriaceae species NOT DETECTED NOT DETECTED Final   Enterobacter cloacae complex NOT DETECTED NOT DETECTED Final   Escherichia coli NOT DETECTED NOT DETECTED Final   Klebsiella oxytoca NOT DETECTED NOT DETECTED Final   Klebsiella pneumoniae NOT DETECTED NOT DETECTED Final   Proteus species NOT DETECTED NOT DETECTED Final   Serratia marcescens NOT DETECTED NOT DETECTED Final   Haemophilus influenzae NOT DETECTED NOT DETECTED Final   Neisseria meningitidis NOT DETECTED NOT DETECTED Final   Pseudomonas aeruginosa NOT DETECTED NOT DETECTED Final   Candida albicans NOT DETECTED NOT DETECTED Final   Candida glabrata NOT DETECTED NOT DETECTED Final   Candida krusei NOT DETECTED NOT DETECTED Final   Candida parapsilosis  NOT DETECTED NOT DETECTED Final   Candida tropicalis NOT DETECTED NOT DETECTED Final    Comment: Performed at [facility], [address] [place], [place] [address]  Blood culture (routine x 2)     Status: None (Preliminary result)   Collection Time: 12/27/17  4:35 PM  Result Value Ref Range Status   Specimen Description   Final    BLOOD LEFT HAND Performed at [facility], [address] [place], [place] [address]    Special Requests   Final    BOTTLES DRAWN AEROBIC ONLY Blood Culture results may not be optimal due to an inadequate volume of blood received in culture bottles Performed at [facility], [address] [place], [place] [address]    Culture   Final    NO GROWTH < 12 HOURS Performed at [facility], [address] [place], [place] [address]    Report Status PENDING  Incomplete  Aerobic/Anaerobic Culture (surgical/deep wound)     Status: None (Preliminary result)   Collection Time: 12/28/17  5:15 AM  Result Value Ref Range Status   Specimen Description   Final    WOUND RIGHT SHOULDER Performed at [facility], [address] [place], [place] [address]    Special Requests   Final    NONE Performed at [facility], [address] [place], [place] [address]    Gram Stain   Final    RARE WBC PRESENT, PREDOMINANTLY PMN RARE GRAM POSITIVE COCCI IN PAIRS Performed at [facility], [address] [place], [place] [address]    Culture PENDING  Incomplete   Report Status PENDING  Incomplete  Aerobic/Anaerobic Culture (surgical/deep wound)     Status: None (Preliminary result)   Collection Time: 12/28/17  5:33 AM  Result Value Ref Range Status   Specimen Description   Final    TISSUE RIGHT SHOULDER Performed at [facility], [address] [place], [place] [address]    Special Requests   Final    NONE Performed at [facility], [address]  [place], [place] [address]    Gram Stain   Final    RARE WBC PRESENT, PREDOMINANTLY PMN RARE GRAM POSITIVE COCCI IN PAIRS RARE GRAM NEGATIVE RODS Performed at [facility], [address] [place], [place] [address]    Culture PENDING  Incomplete   Report Status PENDING  Incomplete         Radiology Studies: Dg Shoulder Right  Result Date: 12/27/2017 CLINICAL DATA:  RIGHT shoulder pain for 5 days, possible repetitive stress injury. EXAM: RIGHT SHOULDER - 2 VIEW; RIGHT HUMERUS - 2+ VIEW COMPARISON:  None.  FINDINGS: RIGHT shoulder: The humeral head is well-formed and located. The subacromial, glenohumeral and acromioclavicular joint spaces are intact. No destructive bony lesions. Soft tissue planes are non-suspicious. RIGHT humerus: No acute fracture deformity or dislocation. No destructive bony lesions. Soft tissue planes are not suspicious. IMPRESSION: Negative. Electronically Signed   By: Awilda Metro M.D.   On: 12/27/2017 14:56   Mr Shoulder Right Wo Contrast  Result Date: 12/27/2017 CLINICAL DATA:  Severe right shoulder pain. Sepsis. History of IV drug abuse. EXAM: MRI OF THE RIGHT SHOULDER WITHOUT CONTRAST TECHNIQUE: Multiplanar, multisequence MR imaging of the shoulder was performed. No intravenous contrast was administered. COMPARISON:  Right shoulder x-rays from same day. FINDINGS: Rotator cuff:  Intact rotator cuff. Muscles: No atrophy or abnormal signal of the muscles of the rotator cuff. Edema within the distal trapezius muscle and proximal deltoid muscle near the clavicular attachment. Biceps long head:  Intact and normally positioned. Acromioclavicular Joint: Small amount of fluid within the acromioclavicular joint with prominent periarticular soft tissue inflammatory changes. Type I acromion. Trace fluid in the subacromial/subdeltoid bursa. Glenohumeral Joint: No joint effusion. No chondral defect. Labrum: Grossly intact, but evaluation is limited by lack of intraarticular  fluid. Bones:  No marrow abnormality, fracture or dislocation. Other: No fluid collection. IMPRESSION: 1. Findings concerning for acromioclavicular joint septic arthritis with small joint effusion and prominent periarticular soft tissue inflammatory changes involving the distal trapezius muscle and proximal deltoid muscle near the clavicular attachment. 2. No evidence of osteomyelitis.  No abscess. 3. Trace subacromial/subdeltoid bursal fluid is likely reactive. 4. No glenohumeral joint effusion. Electronically Signed   By: Obie Dredge M.D.   On: 12/27/2017 22:59   Dg Humerus Right  Result Date: 12/27/2017 CLINICAL DATA:  RIGHT shoulder pain for 5 days, possible repetitive stress injury. EXAM: RIGHT SHOULDER - 2 VIEW; RIGHT HUMERUS - 2+ VIEW COMPARISON:  None. FINDINGS: RIGHT shoulder: The humeral head is well-formed and located. The subacromial, glenohumeral and acromioclavicular joint spaces are intact. No destructive bony lesions. Soft tissue planes are non-suspicious. RIGHT humerus: No acute fracture deformity or dislocation. No destructive bony lesions. Soft tissue planes are not suspicious. IMPRESSION: Negative. Electronically Signed   By: Awilda Metro M.D.   On: 12/27/2017 14:56        Scheduled Meds: . buPROPion  300 mg Oral Daily  . [MAR Hold] cloNIDine  0.1 mg Oral QID   Followed by  . [MAR Hold] cloNIDine  0.1 mg Oral BH-qamhs   Followed by  . [MAR Hold] cloNIDine  0.1 mg Oral QAC breakfast  . divalproex  1,000 mg Oral QPM  . docusate sodium  100 mg Oral BID  . [MAR Hold] enoxaparin (LOVENOX) injection  40 mg Subcutaneous QHS  . fentaNYL      . HYDROmorphone      . HYDROmorphone      . lamoTRIgine  100 mg Oral QPM  . mirtazapine  15 mg Oral QHS  . oxyCODONE      . [MAR Hold] sodium chloride flush  3 mL Intravenous Q12H   Continuous Infusions: . sodium chloride 100 mL/hr at 12/28/17 1053  . sodium chloride 50 mL/hr at 12/28/17 0609  . [MAR Hold] ceFEPime (MAXIPIME)  IV Stopped (12/28/17 0350)  . methocarbamol (ROBAXIN) IV 500 mg (12/28/17 1106)  . methocarbamol    . methocarbamol    . [MAR Hold] metronidazole Stopped (12/28/17 0102)     LOS: 1 day        Glade Lloyd, MD  [facility] Pager [phone]  If 7PM-7AM, please contact night-coverage [email or web address] Password [email or web address] 12/28/2017, 11:20 AM

## 2017-12-28 NOTE — Discharge Instructions (Signed)
Ice to the shoulder. Use the sling to rest the shoulder.  Can remove sling for bathing and gentle exercises.   No pushing pulling or lifting with the shoulder.   Follow up with Dr Ranell PatrickNorris in two weeks in the office, call [phone]

## 2017-12-28 NOTE — Brief Op Note (Signed)
12/28/2017  5:51 AM  PATIENT:  Logan Hancock  27 y.o. male  PRE-OPERATIVE DIAGNOSIS:  Infected right shoulder joint  POST-OPERATIVE DIAGNOSIS:  infected right shoulder joint   PROCEDURE:  Right shoulder arthroscopic I+D and open AC joint I+D, deep cultures  SURGEON:  Surgeon(s) and Role:    Beverely Low* Okie Jansson, MD - Primary  PHYSICIAN ASSISTANT:   ASSISTANTS: none   ANESTHESIA:   general  EBL:  minimal   BLOOD ADMINISTERED:none  DRAINS: none   LOCAL MEDICATIONS USED:  NONE  SPECIMEN:  Source of Specimen:  right shoulder AC joint  DISPOSITION OF SPECIMEN:  micro  COUNTS:  YES  TOURNIQUET:  * No tourniquets in log *  DICTATION: .Other Dictation: Dictation Number [id number]  PLAN OF CARE: Admit to inpatient   PATIENT DISPOSITION:  PACU - hemodynamically stable.   Delay start of Pharmacological VTE agent (>24hrs) due to surgical blood loss or risk of bleeding: not applicable

## 2017-12-28 NOTE — Progress Notes (Signed)
  Echocardiogram 2D Echocardiogram has been performed.  Logan SkeenVijay  Prudie Guthridge 12/28/2017, 2:30 PM

## 2017-12-28 NOTE — ED Notes (Signed)
Unable to urine

## 2017-12-28 NOTE — Anesthesia Preprocedure Evaluation (Addendum)
Anesthesia Evaluation  Patient identified by MRN, date of birth, ID band Patient awake    Reviewed: Allergy & Precautions, NPO status , Patient's Chart, lab work & pertinent test results  History of Anesthesia Complications Negative for: history of anesthetic complications  Airway Mallampati: II  TM Distance: >3 FB Neck ROM: Full    Dental  (+) Teeth Intact, Dental Advisory Given   Pulmonary asthma , Current Smoker,    Pulmonary exam normal breath sounds clear to auscultation       Cardiovascular Exercise Tolerance: Good negative cardio ROS   Rhythm:Regular Rate:Tachycardia     Neuro/Psych PSYCHIATRIC DISORDERS Depression Bipolar Disorder negative neurological ROS     GI/Hepatic negative GI ROS, (+)     substance abuse  cocaine use, marijuana use and IV drug use, Hepatitis -, C  Endo/Other  negative endocrine ROS  Renal/GU negative Renal ROS     Musculoskeletal  (+) Arthritis , narcotic dependentInfected right shoulder   Abdominal   Peds  Hematology negative hematology ROS (+)   Anesthesia Other Findings Day of surgery medications reviewed with the patient.  Reproductive/Obstetrics                           Anesthesia Physical Anesthesia Plan  ASA: III and emergent  Anesthesia Plan: General   Post-op Pain Management:    Induction: Intravenous and Rapid sequence  PONV Risk Score and Plan: 2 and Ondansetron, Midazolam and Diphenhydramine  Airway Management Planned: Oral ETT  Additional Equipment:   Intra-op Plan:   Post-operative Plan: Extubation in OR  Informed Consent: I have reviewed the patients History and Physical, chart, labs and discussed the procedure including the risks, benefits and alternatives for the proposed anesthesia with the patient or authorized representative who has indicated his/her understanding and acceptance.   Dental advisory given  Plan  Discussed with: CRNA  Anesthesia Plan Comments: (Septic right AC joint)      Anesthesia Quick Evaluation

## 2017-12-28 NOTE — Anesthesia Procedure Notes (Signed)
Procedure Name: Intubation Date/Time: 12/28/2017 4:43 AM Performed by: Simran Mannis D, CRNA Pre-anesthesia Checklist: Patient identified, Emergency Drugs available, Suction available and Patient being monitored Patient Re-evaluated:Patient Re-evaluated prior to induction Oxygen Delivery Method: Circle system utilized Preoxygenation: Pre-oxygenation with 100% oxygen Induction Type: IV induction, Rapid sequence and Cricoid Pressure applied Laryngoscope Size: Mac and 4 Grade View: Grade I Tube type: Oral Number of attempts: 1 Airway Equipment and Method: Stylet Placement Confirmation: ETT inserted through vocal cords under direct vision,  positive ETCO2 and breath sounds checked- equal and bilateral Secured at: 24 cm Tube secured with: Tape Dental Injury: Teeth and Oropharynx as per pre-operative assessment

## 2017-12-28 NOTE — Anesthesia Postprocedure Evaluation (Addendum)
Anesthesia Post Note  Patient: Logan Hancock  Procedure(s) Performed: ARTHROSCOPY SHOULDER  incision and drainage right shoulder and open incision and drainage shoulder joint (Right Shoulder)     Patient location during evaluation: PACU Anesthesia Type: General Level of consciousness: awake and alert and awake Pain management: pain level controlled Vital Signs Assessment: post-procedure vital signs reviewed and stable Respiratory status: spontaneous breathing, nonlabored ventilation, respiratory function stable and patient connected to nasal cannula oxygen Cardiovascular status: stable and tachycardic Postop Assessment: no apparent nausea or vomiting Anesthetic complications: no    Last Vitals:  Vitals:   12/28/17 0610 12/28/17 0615  BP:  (!) 98/55  Pulse: (!) 108 (!) 110  Resp: 15 14  Temp:    SpO2: 99% 95%    Last Pain:  Vitals:   12/28/17 0610  TempSrc:   PainSc: 6                  Cecile HearingStephen Edward Hinda Lindor

## 2017-12-28 NOTE — ED Notes (Signed)
Unable to get cell count synovial sent toOR

## 2017-12-28 NOTE — Consult Note (Signed)
[facility]       Reason for Consult: MSSA bacteremia    Referring Physician: Dr. Hanley Ben  Active Problems:   Polysubstance abuse (HCC)   Heroin use disorder, severe (HCC)   Sepsis (HCC)   Septic arthritis of shoulder, right (HCC)   Hyponatremia   Infection of shoulder (HCC)   . buPROPion  300 mg Oral Daily  . cloNIDine  0.1 mg Oral QID   Followed by  . [START ON 12/30/2017] cloNIDine  0.1 mg Oral BH-qamhs   Followed by  . [START ON 01/02/2018] cloNIDine  0.1 mg Oral QAC breakfast  . docusate sodium  100 mg Oral BID  . enoxaparin (LOVENOX) injection  40 mg Subcutaneous QHS  . fentaNYL      . HYDROmorphone      . HYDROmorphone      . mirtazapine  15 mg Oral QHS  . oxyCODONE      . sodium chloride flush  3 mL Intravenous Q12H    Recommendations: Cefazolin TTE Repeat blood cultures tomorrow  Will repeat HIV, hepatitis C labs  Assessment: He has septic arthritis of the shoulder and bacteremia with Staph aureus   Antibiotics: Cefepime, flagyl, vancomycin  HPI: Logan Hancock is a 27 y.o. male with history of IVDU active and came in with shoulder pain and found above after MRI.  Debrided by Dr. Ranell Patrick overnight and blood culture positive.  Main concern now is pain.  No other significant medical history.  No associated rash or diarrhea.     Review of Systems:  Constitutional: positive for fevers and chills or negative for anorexia Gastrointestinal: positive for nausea, negative for constipation Integument/breast: negative for rash All other systems reviewed and are negative    Past Medical History:  Diagnosis Date  . Asthma   . Bipolar 1 disorder (HCC)   . Drug addiction (HCC)   . MRSA (methicillin resistant staph aureus) culture positive     Social History   Tobacco Use  . Smoking status: Current Every Day Smoker    Packs/day: 0.50    Years: 2.00    Pack years: 1.00    Types: Cigarettes  . Smokeless tobacco: Never Used    Substance Use Topics  . Alcohol use: No    Frequency: Never  . Drug use: Yes    Frequency: 3.0 times per week    Types: Marijuana, Cocaine    FMH: + cardiac disease  No Known Allergies  Physical Exam: Constitutional: in no apparent distress and alert  Vitals:   12/28/17 1130 12/28/17 1350  BP: (!) 92/50 (!) 106/55  Pulse: (!) 108 (!) 108  Resp: 18 20  Temp:  99.2 F (37.3 C)  SpO2: 99% 100%   EYES: anicteric ENMT: no thrush Cardiovascular: Cor RRR Respiratory: CTA B; normal respiratory effort GI: soft, nt Musculoskeletal: no pedal edema noted Skin: negatives: no rash Neuro: non-focal  Lab Results  Component Value Date   WBC 16.3 (H) 12/28/2017   HGB 12.7 (L) 12/28/2017   HCT 39.8 12/28/2017   MCV 82.2 12/28/2017   PLT 156 12/28/2017    Lab Results  Component Value Date   CREATININE 2.42 (H) 12/28/2017   BUN 31 (H) 12/28/2017   NA 131 (L) 12/28/2017   K 4.3 12/28/2017   CL 102 12/28/2017   CO2 15 (L) 12/28/2017    Lab Results  Component Value Date   ALT 25 12/27/2017   AST 27 12/27/2017  [facility] 63 12/27/2017     Microbiology: Recent Results (from the past 240 hour(s))  Blood culture (routine x 2)     Status: None (Preliminary result)   Collection Time: 12/27/17  4:19 PM  Result Value Ref Range Status   Specimen Description   Final    BLOOD LEFT ANTECUBITAL Performed at [facility], [address] [place], [place] [address]    Special Requests   Final    BOTTLES DRAWN AEROBIC AND ANAEROBIC Blood Culture results may not be optimal due to an excessive volume of blood received in culture bottles Performed at [facility], [address] [place], [place] [address]    Culture  Setup Time   Final    GRAM POSITIVE COCCI IN CLUSTERS IN BOTH AEROBIC AND ANAEROBIC BOTTLES Organism ID to follow CRITICAL RESULT CALLED TO, READ BACK BY AND VERIFIED WITH: D. Wofford PharmD 10:25 12/28/17 ([email or web address]) Performed at  [facility], [address] [place], [place] [address]    Culture GRAM POSITIVE COCCI  Final   Report Status PENDING  Incomplete  Blood Culture ID Panel (Reflexed)     Status: Abnormal   Collection Time: 12/27/17  4:19 PM  Result Value Ref Range Status   Enterococcus species NOT DETECTED NOT DETECTED Final   Listeria monocytogenes NOT DETECTED NOT DETECTED Final   Staphylococcus species DETECTED (A) NOT DETECTED Final    Comment: CRITICAL RESULT CALLED TO, READ BACK BY AND VERIFIED WITH: D. Wofford PharmD 10:25 12/28/17 ([email or web address])    Staphylococcus aureus (BCID) DETECTED (A) NOT DETECTED Final    Comment: Methicillin (oxacillin) susceptible Staphylococcus aureus (MSSA). Preferred therapy is anti staphylococcal beta lactam antibiotic (Cefazolin or Nafcillin), unless clinically contraindicated. CRITICAL RESULT CALLED TO, READ BACK BY AND VERIFIED WITH: D. Wofford PharmD 10:25 12/28/17 ([email or web address])    Methicillin resistance NOT DETECTED NOT DETECTED Final   Streptococcus species NOT DETECTED NOT DETECTED Final   Streptococcus agalactiae NOT DETECTED NOT DETECTED Final   Streptococcus pneumoniae NOT DETECTED NOT DETECTED Final   Streptococcus pyogenes NOT DETECTED NOT DETECTED Final   Acinetobacter baumannii NOT DETECTED NOT DETECTED Final   Enterobacteriaceae species NOT DETECTED NOT DETECTED Final   Enterobacter cloacae complex NOT DETECTED NOT DETECTED Final   Escherichia coli NOT DETECTED NOT DETECTED Final   Klebsiella oxytoca NOT DETECTED NOT DETECTED Final   Klebsiella pneumoniae NOT DETECTED NOT DETECTED Final   Proteus species NOT DETECTED NOT DETECTED Final   Serratia marcescens NOT DETECTED NOT DETECTED Final   Haemophilus influenzae NOT DETECTED NOT DETECTED Final   Neisseria meningitidis NOT DETECTED NOT DETECTED Final   Pseudomonas aeruginosa NOT DETECTED NOT DETECTED Final   Candida albicans NOT DETECTED NOT DETECTED Final   Candida glabrata NOT DETECTED NOT DETECTED  Final   Candida krusei NOT DETECTED NOT DETECTED Final   Candida parapsilosis NOT DETECTED NOT DETECTED Final   Candida tropicalis NOT DETECTED NOT DETECTED Final    Comment: Performed at [facility], [address] [place], [place] [address]  Blood culture (routine x 2)     Status: None (Preliminary result)   Collection Time: 12/27/17  4:35 PM  Result Value Ref Range Status   Specimen Description   Final    BLOOD LEFT HAND Performed at [facility], [address] [place], [place] [address]    Special Requests   Final    BOTTLES DRAWN AEROBIC ONLY Blood Culture results may not be optimal due  to an inadequate volume of blood received in culture bottles Performed at [facility], [address] [place], [place] [address]    Culture  Setup Time   Final    GRAM POSITIVE COCCI AEROBIC BOTTLE ONLY CRITICAL VALUE NOTED.  VALUE IS CONSISTENT WITH PREVIOUSLY REPORTED AND CALLED VALUE. Performed at [facility], [address] [place], [place] [address]    Culture GRAM POSITIVE COCCI  Final   Report Status PENDING  Incomplete  Aerobic/Anaerobic Culture (surgical/deep wound)     Status: None (Preliminary result)   Collection Time: 12/28/17  5:15 AM  Result Value Ref Range Status   Specimen Description   Final    WOUND RIGHT SHOULDER Performed at [facility], [address] [place], [place] [address]    Special Requests   Final    NONE Performed at [facility], [address] [place], [place] [address]    Gram Stain   Final    RARE WBC PRESENT, PREDOMINANTLY PMN RARE GRAM POSITIVE COCCI IN PAIRS Performed at [facility], [address] [place], [place] [address]    Culture PENDING  Incomplete   Report Status PENDING  Incomplete  Aerobic/Anaerobic Culture (surgical/deep wound)     Status: None (Preliminary result)   Collection Time: 12/28/17  5:33 AM  Result Value Ref Range Status   Specimen  Description   Final    TISSUE RIGHT SHOULDER Performed at [facility], [address] [place], [place] [address]    Special Requests   Final    NONE Performed at [facility], [address] [place], [place] [address]    Gram Stain   Final    RARE WBC PRESENT, PREDOMINANTLY PMN RARE GRAM POSITIVE COCCI IN PAIRS RARE GRAM NEGATIVE RODS Performed at [facility], [address] [place], [place] [address]    Culture PENDING  Incomplete   Report Status PENDING  Incomplete    Gardiner Barefootobert W Yahaira Bruski, MD [facility] [email or web address] [phone] pager  [phone] cell 12/28/2017, 3:33 PM

## 2017-12-28 NOTE — Transfer of Care (Signed)
Immediate Anesthesia Transfer of Care Note  Patient: Logan Hancock  Procedure(s) Performed: ARTHROSCOPY SHOULDER  incision and drainage right shoulder and open incision and drainage shoulder joint (Right Shoulder)  Patient Location: PACU  Anesthesia Type:General  Level of Consciousness: awake, alert  and oriented  Airway & Oxygen Therapy: Patient Spontanous Breathing and Patient connected to face mask oxygen  Post-op Assessment: Report given to RN and Post -op Vital signs reviewed and stable  Post vital signs: Reviewed and stable  Last Vitals:  Vitals Value Taken Time  BP 102/65 12/28/2017  6:00 AM  Temp    Pulse 111 12/28/2017  6:02 AM  Resp 15 12/28/2017  6:01 AM  SpO2 100 % 12/28/2017  6:02 AM  Vitals shown include unvalidated device data.  Last Pain:  Vitals:   12/28/17 0306  TempSrc: Oral  PainSc: 9          Complications: No apparent anesthesia complications

## 2017-12-29 ENCOUNTER — Encounter (HOSPITAL_COMMUNITY): Payer: Self-pay | Admitting: Orthopedic Surgery

## 2017-12-29 LAB — CBC WITH DIFFERENTIAL/PLATELET
Abs Immature Granulocytes: 0.1 10*3/uL — ABNORMAL HIGH (ref 0.00–0.07)
Basophils Absolute: 0 10*3/uL (ref 0.0–0.1)
Basophils Relative: 0 %
Eosinophils Absolute: 0.2 10*3/uL (ref 0.0–0.5)
Eosinophils Relative: 3 %
HCT: 37.6 % — ABNORMAL LOW (ref 39.0–52.0)
Hemoglobin: 12 g/dL — ABNORMAL LOW (ref 13.0–17.0)
Immature Granulocytes: 1 %
Lymphocytes Relative: 8 %
Lymphs Abs: 0.7 10*3/uL (ref 0.7–4.0)
MCH: 26.2 pg (ref 26.0–34.0)
MCHC: 31.9 g/dL (ref 30.0–36.0)
MCV: 82.1 fL (ref 80.0–100.0)
Monocytes Absolute: 0.5 10*3/uL (ref 0.1–1.0)
Monocytes Relative: 5 %
NEUTROS PCT: 83 %
Neutro Abs: 7.3 10*3/uL (ref 1.7–7.7)
Platelets: 100 10*3/uL — ABNORMAL LOW (ref 150–400)
RBC: 4.58 MIL/uL (ref 4.22–5.81)
RDW: 16 % — ABNORMAL HIGH (ref 11.5–15.5)
WBC: 8.8 10*3/uL (ref 4.0–10.5)
nRBC: 0 % (ref 0.0–0.2)

## 2017-12-29 LAB — COMPREHENSIVE METABOLIC PANEL
ALBUMIN: 2.7 g/dL — AB (ref 3.5–5.0)
ALT: 47 U/L — ABNORMAL HIGH (ref 0–44)
AST: 56 U/L — ABNORMAL HIGH (ref 15–41)
Alkaline Phosphatase: 93 U/L (ref 38–126)
Anion gap: 14 (ref 5–15)
BUN: 22 mg/dL — ABNORMAL HIGH (ref 6–20)
CO2: 18 mmol/L — ABNORMAL LOW (ref 22–32)
CREATININE: 1.41 mg/dL — AB (ref 0.61–1.24)
Calcium: 7.3 mg/dL — ABNORMAL LOW (ref 8.9–10.3)
Chloride: 103 mmol/L (ref 98–111)
GFR calc Af Amer: >60 mL/min (ref >60)
GFR calc non Af Amer: >60 mL/min (ref >60)
Glucose, Bld: 116 mg/dL — ABNORMAL HIGH (ref 70–99)
Potassium: 3.4 mmol/L — ABNORMAL LOW (ref 3.5–5.1)
Sodium: 135 mmol/L (ref 135–145)
Total Bilirubin: 4 mg/dL — ABNORMAL HIGH (ref 0.3–1.2)
Total Protein: 5.6 g/dL — ABNORMAL LOW (ref 6.5–8.1)

## 2017-12-29 LAB — HEPATITIS PANEL, ACUTE
HCV Ab: >11 s/co ratio — ABNORMAL HIGH (ref 0.0–0.9)
HEP A IGM: NEGATIVE
Hep B C IgM: NEGATIVE
Hepatitis B Surface Ag: NEGATIVE

## 2017-12-29 LAB — MAGNESIUM: Magnesium: 1.6 mg/dL — ABNORMAL LOW (ref 1.7–2.4)

## 2017-12-29 LAB — RAPID URINE DRUG SCREEN, HOSP PERFORMED
Amphetamines: POSITIVE — AB
Barbiturates: NOT DETECTED
Benzodiazepines: POSITIVE — AB
Cocaine: NOT DETECTED
Opiates: POSITIVE — AB
Tetrahydrocannabinol: NOT DETECTED

## 2017-12-29 LAB — HIV ANTIBODY (ROUTINE TESTING W REFLEX): HIV Screen 4th Generation wRfx: NONREACTIVE

## 2017-12-29 MED ORDER — POTASSIUM CHLORIDE CRYS ER 20 MEQ PO TBCR
40.0000 meq | EXTENDED_RELEASE_TABLET | Freq: Once | ORAL | Status: AC
  Administered 2017-12-29: 40 meq via ORAL
  Filled 2017-12-29: qty 2

## 2017-12-29 MED ORDER — LIP MEDEX EX OINT
TOPICAL_OINTMENT | CUTANEOUS | Status: AC
  Filled 2017-12-29: qty 7

## 2017-12-29 MED ORDER — MAGNESIUM SULFATE 2 GM/50ML IV SOLN
2.0000 g | Freq: Once | INTRAVENOUS | Status: AC
  Administered 2017-12-29: 2 g via INTRAVENOUS
  Filled 2017-12-29: qty 50

## 2017-12-29 NOTE — Care Management Note (Signed)
Case Management Note  Patient Details  Name: Logan Hancock MRN: [id number] Date of Birth: 07/19/1990  Subjective/Objective:                  Has trouble getting medication and to the mdo.  Action/Plan: csw consult done for community resources.  Expected Discharge Date:  (unknown)               Expected Discharge Plan:  Home/Self Care  In-House Referral:  Clinical Social Work  Discharge planning Services  CM Consult, Other - See comment, Indigent Health Clinic  Post Acute Care Choice:    Choice offered to:     DME Arranged:    DME Agency:     HH Arranged:    HH Agency:     Status of Service:  In process, will continue to follow  If discussed at Long Length of Stay Meetings, dates discussed:    Additional Comments:  Golda AcreDavis, Rhonda Lynn, RN 12/29/2017, 8:44 AM

## 2017-12-29 NOTE — Progress Notes (Signed)
[facility]   Reason for visit: Follow up on bacteremia and septic arthritis  Interval History: TTE negative for vegeation, cultures from shoulder with Staph aureus as well, no fever, WBC wnl.  No new issues with no associated rash, diarrhea.   Physical Exam: Constitutional:  Vitals:   12/29/17 1317 12/29/17 1405  BP: (!) 145/93 (!) 146/80  Pulse: 97 (!) 101  Resp: 18 18  Temp:  98.2 F (36.8 C)  SpO2: 98% 100%   patient appears in NAD Respiratory: Normal respiratory effort; CTA B Cardiovascular: RRR GI: soft, nt, nd  Review of Systems: Constitutional: negative for fevers, chills and anorexia Respiratory: negative for cough Integument/breast: negative for rash  Lab Results  Component Value Date   WBC 8.8 12/29/2017   HGB 12.0 (L) 12/29/2017   HCT 37.6 (L) 12/29/2017   MCV 82.1 12/29/2017   PLT 100 (L) 12/29/2017    Lab Results  Component Value Date   CREATININE 1.41 (H) 12/29/2017   BUN 22 (H) 12/29/2017   NA 135 12/29/2017   K 3.4 (L) 12/29/2017   CL 103 12/29/2017   CO2 18 (L) 12/29/2017    Lab Results  Component Value Date   ALT 47 (H) 12/29/2017   AST 56 (H) 12/29/2017   ALKPHOS 93 12/29/2017     Microbiology: Recent Results (from the past 240 hour(s))  Blood culture (routine x 2)     Status: Abnormal (Preliminary result)   Collection Time: 12/27/17  4:19 PM  Result Value Ref Range Status   Specimen Description   Final    BLOOD LEFT ANTECUBITAL Performed at [facility], [address] [place], [place] [address]    Special Requests   Final    BOTTLES DRAWN AEROBIC AND ANAEROBIC Blood Culture results may not be optimal due to an excessive volume of blood received in culture bottles Performed at [facility], [address], [place], [place] [address]    Culture  Setup Time   Final    GRAM POSITIVE COCCI IN CLUSTERS IN BOTH AEROBIC AND ANAEROBIC BOTTLES CRITICAL RESULT CALLED TO,  READ BACK BY AND VERIFIED WITH: D. Wofford PharmD 10:25 12/28/17 ([email or web address])    Culture (A)  Final    STAPHYLOCOCCUS AUREUS SUSCEPTIBILITIES TO FOLLOW Performed at [facility], [address] [place], [place] [address]    Report Status PENDING  Incomplete  Blood Culture ID Panel (Reflexed)     Status: Abnormal   Collection Time: 12/27/17  4:19 PM  Result Value Ref Range Status   Enterococcus species NOT DETECTED NOT DETECTED Final   Listeria monocytogenes NOT DETECTED NOT DETECTED Final   Staphylococcus species DETECTED (A) NOT DETECTED Final    Comment: CRITICAL RESULT CALLED TO, READ BACK BY AND VERIFIED WITH: D. Wofford PharmD 10:25 12/28/17 ([email or web address])    Staphylococcus aureus (BCID) DETECTED (A) NOT DETECTED Final    Comment: Methicillin (oxacillin) susceptible Staphylococcus aureus (MSSA). Preferred therapy is anti staphylococcal beta lactam antibiotic (Cefazolin or Nafcillin), unless clinically contraindicated. CRITICAL RESULT CALLED TO, READ BACK BY AND VERIFIED WITH: D. Wofford PharmD 10:25 12/28/17 ([email or web address])    Methicillin resistance NOT DETECTED NOT DETECTED Final   Streptococcus species NOT DETECTED NOT DETECTED Final   Streptococcus agalactiae NOT DETECTED NOT DETECTED Final   Streptococcus pneumoniae NOT DETECTED NOT DETECTED Final   Streptococcus pyogenes NOT DETECTED NOT DETECTED Final   Acinetobacter baumannii NOT DETECTED NOT DETECTED Final   Enterobacteriaceae species  NOT DETECTED NOT DETECTED Final   Enterobacter cloacae complex NOT DETECTED NOT DETECTED Final   Escherichia coli NOT DETECTED NOT DETECTED Final   Klebsiella oxytoca NOT DETECTED NOT DETECTED Final   Klebsiella pneumoniae NOT DETECTED NOT DETECTED Final   Proteus species NOT DETECTED NOT DETECTED Final   Serratia marcescens NOT DETECTED NOT DETECTED Final   Haemophilus influenzae NOT DETECTED NOT DETECTED Final   Neisseria meningitidis NOT DETECTED NOT DETECTED Final   Pseudomonas aeruginosa  NOT DETECTED NOT DETECTED Final   Candida albicans NOT DETECTED NOT DETECTED Final   Candida glabrata NOT DETECTED NOT DETECTED Final   Candida krusei NOT DETECTED NOT DETECTED Final   Candida parapsilosis NOT DETECTED NOT DETECTED Final   Candida tropicalis NOT DETECTED NOT DETECTED Final    Comment: Performed at [facility], [address] [place], [place] [address]  Blood culture (routine x 2)     Status: Abnormal (Preliminary result)   Collection Time: 12/27/17  4:35 PM  Result Value Ref Range Status   Specimen Description   Final    BLOOD LEFT HAND Performed at [facility], [address] [place], [place] [address]    Special Requests   Final    BOTTLES DRAWN AEROBIC ONLY Blood Culture results may not be optimal due to an inadequate volume of blood received in culture bottles Performed at [facility], [address] [place], [place] [address]    Culture  Setup Time   Final    GRAM POSITIVE COCCI AEROBIC BOTTLE ONLY CRITICAL VALUE NOTED.  VALUE IS CONSISTENT WITH PREVIOUSLY REPORTED AND CALLED VALUE.    Culture (A)  Final    STAPHYLOCOCCUS AUREUS SUSCEPTIBILITIES TO FOLLOW Performed at [facility], [address] [place], [place] [address]    Report Status PENDING  Incomplete  Aerobic/Anaerobic Culture (surgical/deep wound)     Status: None (Preliminary result)   Collection Time: 12/28/17  5:15 AM  Result Value Ref Range Status   Specimen Description   Final    WOUND RIGHT SHOULDER Performed at [facility], [address] [place], [place] [address]    Special Requests   Final    NONE Performed at [facility], [address] [place], [place] [address]    Gram Stain   Final    RARE WBC PRESENT, PREDOMINANTLY PMN RARE GRAM POSITIVE COCCI IN PAIRS    Culture   Final    FEW STAPHYLOCOCCUS AUREUS SUSCEPTIBILITIES TO FOLLOW Performed at [facility], [address]  [place], [place] [address]    Report Status PENDING  Incomplete  Aerobic/Anaerobic Culture (surgical/deep wound)     Status: None (Preliminary result)   Collection Time: 12/28/17  5:33 AM  Result Value Ref Range Status   Specimen Description   Final    TISSUE RIGHT SHOULDER Performed at [facility], [address] [place], [place] [address]    Special Requests   Final    NONE Performed at [facility], [address] [place], [place] [address]    Gram Stain   Final    RARE WBC PRESENT, PREDOMINANTLY PMN RARE GRAM POSITIVE COCCI IN PAIRS RARE GRAM NEGATIVE RODS    Culture   Final    FEW STAPHYLOCOCCUS AUREUS SUSCEPTIBILITIES TO FOLLOW Performed at [facility], [address] [place], [place] [address]    Report Status PENDING  Incomplete  Impression/Plan:  1. Bacteremia - Staph aureus and repeat cultures sent.  TTE without vegetation.  I would do a TEE when able.  Continue cefazolin for a prolonged course.    2. Septic arthritis - growth as above.  Debrided and on cefazolin.    3.  Screening - hepatitis C positive, HIV negative.  I will check an RNA.    I will follow up again on Monday.  Dr. Orvan Falconerampbell available over the weekend if needed.

## 2017-12-29 NOTE — Progress Notes (Signed)
   Subjective: 1 Day Post-Op Procedure(s) (LRB): ARTHROSCOPY SHOULDER  incision and drainage right shoulder and open incision and drainage shoulder joint (Right)  Recheck right shoulder s/p I&D and scope of right shoulder Pt still c/o moderate soreness in the right shoulder and right paraspinal muscles Currently being managed by ID and medical services Patient reports pain as moderate. Pt would like to take a shower but discussed the IV and not getting it wet White count has improved s/p surgery and IV antibiotics  Objective:   VITALS:   Vitals:   12/29/17 1317 12/29/17 1405  BP: (!) 145/93 (!) 146/80  Pulse: 97 (!) 101  Resp: 18 18  Temp:  98.2 F (36.8 C)  SpO2: 98% 100%    Right shoulder incisions healing well nv intact distally No rashes or edema  No erythema or drainage   LABS Recent Labs    12/27/17 1619 12/28/17 0920 12/29/17 0534  HGB 14.3 12.7* 12.0*  HCT 44.9 39.8 37.6*  WBC 9.8 16.3* 8.8  PLT 156 156 100*    Recent Labs    12/27/17 1619 12/28/17 0920 12/29/17 0534  NA 130* 131* 135  K 3.9 4.3 3.4*  BUN 11 31* 22*  CREATININE 0.89 2.42* 1.41*  GLUCOSE 125* 127* 116*     Assessment/Plan: 1 Day Post-Op Procedure(s) (LRB): ARTHROSCOPY SHOULDER  incision and drainage right shoulder and open incision and drainage shoulder joint (Right) Continue IV antibiotics Range of motion as tolerated Will continue to monitor his progress     Alphonsa OverallBrad Mavin Dyke PA-C, MPAS [facility] now [facility]  [place] [address], [place], [place] [address] Phone: [phone] [email or web address] Facebook  [employer]  LinkedIn  Twitter

## 2017-12-29 NOTE — Progress Notes (Signed)
Patient ID: Logan Hancock, male   DOB: 10/18/1990, 27 y.o.   MRN: [id number]  PROGRESS NOTE    Logan Hancock  [id number] DOB: 08/19/1990 DOA: 12/27/2017 PCP: Patient, No Pcp Per   Brief Narrative:  27 year old male with history of polysubstance abuse including cocaine, IV heroin and marijuana; bipolar disorder presented with 5 days of right shoulder pain and swelling.  He was found to be febrile in the ED with elevated CRP.  Orthopedics was consulted for probable septic right shoulder.  MRI revealed signs concerning for septic arthritis.  Patient was started on antibiotics.  He was also found to have staph bacteremia, ID was consulted.   Assessment & Plan:   Active Problems:   Polysubstance abuse (HCC)   Heroin use disorder, severe (HCC)   Sepsis (HCC)   Septic arthritis of shoulder, right (HCC)   Hyponatremia   Infection of shoulder (HCC)  Sepsis secondary to septic joint -Antibiotics plan as below.  Decrease normal saline to 100 cc an hour.  Blood pressure improving.  Monitor  Right acromioclavicular joint septic arthritis -Confirmed by MRI. -Status post I&D on 12/28/2017.  Follow OR cultures; Gram stain showing gram-positive cocci in pairs.   -Initially started on broad-spectrum antibiotics which were switched to IV Ancef by ID on 12/28/2017.  Leukocytosis -Probably secondary to above.  Resolved.  Acute kidney injury  -Probably secondary to above.  Improving.  Repeat a.m. labs.  IV fluids plan as above.  Staph bacteremia -BCID suggests MSSA bacteremia.  Follow cultures and sensitivities.  Follow blood cultures from today. -2D echo was negative for any vegetation.  Polysubstance abuse  -Reports recent use of heroin and methamphetamines -Monitor for withdrawal.  Continue opiate withdrawal protocol -Social worker consult for substance abuse  Thrombocytopenia -Monitor.  No signs of bleeding.  Hypokalemia -Replace.  Repeat a.m. labs  Hypomagnesemia -Replace.  Repeat  a.m. labs.  Hypo-natremia/hypochloremia -Improved.  Monitor  Bipolar disorder -Patient apparently is no longer on medications for bipolar disorder.  Outpatient follow-up  History of asthma -Albuterol as needed.   DVT prophylaxis: Lovenox Code Status: Full Family Communication: None at bedside Disposition Plan: Depends on clinical outcome  Consultants: Orthopedics.  ID  Procedures: I&D of right acromioclavicular joint Echo on 12/28/17 Study Conclusions  - Procedure narrative: Transthoracic echocardiography. Image   quality was suboptimal. The study was technically difficult, as a   result of restricted patient mobility. - Left ventricle: The cavity size was normal. Wall thickness was   normal. Systolic function was normal. The estimated ejection   fraction was in the range of 60% to 65%. Although no diagnostic   regional wall motion abnormality was identified, this possibility   cannot be completely excluded on the basis of this study. The   study is not technically sufficient to allow evaluation of LV   diastolic function. - Aortic valve: Transvalvular velocity was within the normal range.   There was no stenosis. There was no regurgitation. - Mitral valve: There was trivial regurgitation. - Left atrium: The atrium was normal in size. - Right ventricle: The cavity size was normal. Wall thickness was   normal. Systolic function was normal. - Right atrium: The atrium was normal in size. Central venous   pressure (est): 15 mm Hg. - Tricuspid valve: There was trivial regurgitation. - Pulmonic valve: There was trivial regurgitation. - Pulmonary arteries: Systolic pressure could not be accurately   estimated. - Inferior vena cava: The vessel was dilated. The respirophasic   diameter  changes were blunted (< 50%). - Pericardium, extracardiac: There was no pericardial effusion.  Impressions:  - There was no evidence of a vegetation.  Recommendations:  Image quality  suboptimal for detection of small vegetations. Consider TEE if clinically indicated.   Antimicrobials: Cefepime, Flagyl, vancomycin from 12/27/2017-12/28/17 Cefazolin from 12/28/2017 onwards   Subjective: Patient seen and examined at bedside.  Poor historian.  Complains of some right shoulder pain.  No overnight vomiting. Objective: Vitals:   12/28/17 2034 12/28/17 2150 12/29/17 0158 12/29/17 0508  BP: (!) 112/53 (!) 113/52 (!) 136/52 (!) 147/65  Pulse: (!) 107 (!) 105 (!) 108 97  Resp: 18 20 18 18   Temp: 99.3 F (37.4 C) (!) 100.6 F (38.1 C) 98.8 F (37.1 C) 98.8 F (37.1 C)  TempSrc: Oral Oral Oral Oral  SpO2: 99% 97% 97% 100%  Weight:      Height:        Intake/Output Summary (Last 24 hours) at 12/29/2017 1008 Last data filed at 12/29/2017 1610 Gross per 24 hour  Intake 1250 ml  Output 400 ml  Net 850 ml   Filed Weights   12/27/17 1407  Weight: 104.3 kg    Examination:  General exam: Awake, answers some questions.  Poor historian. Respiratory system: Bilateral decreased breath sounds at bases, no wheezing Cardiovascular system: S1 & S2 heard, intermittent tachycardia Gastrointestinal system: Abdomen is nondistended, soft and nontender. Normal bowel sounds heard. Extremities: No cyanosis, clubbing, edema.  Right shoulder dressing present    Data Reviewed: I have personally reviewed following labs and imaging studies  CBC: Recent Labs  Lab 12/27/17 1619 12/28/17 0920 12/29/17 0534  WBC 9.8 16.3* 8.8  NEUTROABS 9.4*  --  7.3  HGB 14.3 12.7* 12.0*  HCT 44.9 39.8 37.6*  MCV 82.7 82.2 82.1  PLT 156 156 100*   Basic Metabolic Panel: Recent Labs  Lab 12/27/17 1619 12/28/17 0920 12/29/17 0534  NA 130* 131* 135  K 3.9 4.3 3.4*  CL 94* 102 103  CO2 23 15* 18*  GLUCOSE 125* 127* 116*  BUN 11 31* 22*  CREATININE 0.89 2.42* 1.41*  CALCIUM 8.8* 6.9* 7.3*  MG  --   --  1.6*   GFR: Estimated Creatinine Clearance: 106.3 mL/min (A) (by C-G formula  based on SCr of 1.41 mg/dL (H)). Liver Function Tests: Recent Labs  Lab 12/27/17 1619 12/29/17 0534  AST 27 56*  ALT 25 47*  ALKPHOS 63 93  BILITOT 4.1* 4.0*  PROT 7.6 5.6*  ALBUMIN 4.0 2.7*   No results for input(s): LIPASE, AMYLASE in the last 168 hours. No results for input(s): AMMONIA in the last 168 hours. Coagulation Profile: Recent Labs  Lab 12/27/17 1619  INR 1.10   Cardiac Enzymes: No results for input(s): CKTOTAL, CKMB, CKMBINDEX, TROPONINI in the last 168 hours. BNP (last 3 results) No results for input(s): PROBNP in the last 8760 hours. HbA1C: No results for input(s): HGBA1C in the last 72 hours. CBG: No results for input(s): GLUCAP in the last 168 hours. Lipid Profile: No results for input(s): CHOL, HDL, LDLCALC, TRIG, CHOLHDL, LDLDIRECT in the last 72 hours. Thyroid Function Tests: No results for input(s): TSH, T4TOTAL, FREET4, T3FREE, THYROIDAB in the last 72 hours. Anemia Panel: No results for input(s): VITAMINB12, FOLATE, FERRITIN, TIBC, IRON, RETICCTPCT in the last 72 hours. Sepsis Labs: Recent Labs  Lab 12/27/17 1630  LATICACIDVEN 1.18    Recent Results (from the past 240 hour(s))  Blood culture (routine x 2)  Status: Abnormal (Preliminary result)   Collection Time: 12/27/17  4:19 PM  Result Value Ref Range Status   Specimen Description   Final    BLOOD LEFT ANTECUBITAL Performed at [facility], [address] [place], [place] [address]    Special Requests   Final    BOTTLES DRAWN AEROBIC AND ANAEROBIC Blood Culture results may not be optimal due to an excessive volume of blood received in culture bottles Performed at [facility], [address] [place], [place] [address]    Culture  Setup Time   Final    GRAM POSITIVE COCCI IN CLUSTERS IN BOTH AEROBIC AND ANAEROBIC BOTTLES CRITICAL RESULT CALLED TO, READ BACK BY AND VERIFIED WITH: D. Wofford PharmD 10:25 12/28/17 ([email or web address])    Culture (A)  Final     STAPHYLOCOCCUS AUREUS SUSCEPTIBILITIES TO FOLLOW Performed at [facility], [address], [place], [place] [address]    Report Status PENDING  Incomplete  Blood Culture ID Panel (Reflexed)     Status: Abnormal   Collection Time: 12/27/17  4:19 PM  Result Value Ref Range Status   Enterococcus species NOT DETECTED NOT DETECTED Final   Listeria monocytogenes NOT DETECTED NOT DETECTED Final   Staphylococcus species DETECTED (A) NOT DETECTED Final    Comment: CRITICAL RESULT CALLED TO, READ BACK BY AND VERIFIED WITH: D. Wofford PharmD 10:25 12/28/17 ([email or web address])    Staphylococcus aureus (BCID) DETECTED (A) NOT DETECTED Final    Comment: Methicillin (oxacillin) susceptible Staphylococcus aureus (MSSA). Preferred therapy is anti staphylococcal beta lactam antibiotic (Cefazolin or Nafcillin), unless clinically contraindicated. CRITICAL RESULT CALLED TO, READ BACK BY AND VERIFIED WITH: D. Wofford PharmD 10:25 12/28/17 ([email or web address])    Methicillin resistance NOT DETECTED NOT DETECTED Final   Streptococcus species NOT DETECTED NOT DETECTED Final   Streptococcus agalactiae NOT DETECTED NOT DETECTED Final   Streptococcus pneumoniae NOT DETECTED NOT DETECTED Final   Streptococcus pyogenes NOT DETECTED NOT DETECTED Final   Acinetobacter baumannii NOT DETECTED NOT DETECTED Final   Enterobacteriaceae species NOT DETECTED NOT DETECTED Final   Enterobacter cloacae complex NOT DETECTED NOT DETECTED Final   Escherichia coli NOT DETECTED NOT DETECTED Final   Klebsiella oxytoca NOT DETECTED NOT DETECTED Final   Klebsiella pneumoniae NOT DETECTED NOT DETECTED Final   Proteus species NOT DETECTED NOT DETECTED Final   Serratia marcescens NOT DETECTED NOT DETECTED Final   Haemophilus influenzae NOT DETECTED NOT DETECTED Final   Neisseria meningitidis NOT DETECTED NOT DETECTED Final   Pseudomonas aeruginosa NOT DETECTED NOT DETECTED Final   Candida albicans NOT DETECTED NOT DETECTED Final   Candida  glabrata NOT DETECTED NOT DETECTED Final   Candida krusei NOT DETECTED NOT DETECTED Final   Candida parapsilosis NOT DETECTED NOT DETECTED Final   Candida tropicalis NOT DETECTED NOT DETECTED Final    Comment: Performed at [facility], [address], [place], [place] [address]  Blood culture (routine x 2)     Status: Abnormal (Preliminary result)   Collection Time: 12/27/17  4:35 PM  Result Value Ref Range Status   Specimen Description   Final    BLOOD LEFT HAND Performed at [facility], [address], [place], [place] [address]    Special Requests   Final    BOTTLES DRAWN AEROBIC ONLY Blood Culture results may not be optimal due to an inadequate volume of blood received in culture bottles Performed at [facility], [address] [place], [place]  [address]    Culture  Setup Time   Final    GRAM POSITIVE COCCI AEROBIC BOTTLE ONLY CRITICAL VALUE NOTED.  VALUE IS CONSISTENT WITH PREVIOUSLY REPORTED AND CALLED VALUE.    Culture (A)  Final    STAPHYLOCOCCUS AUREUS SUSCEPTIBILITIES TO FOLLOW Performed at [facility], [address], [place], [place] [address]    Report Status PENDING  Incomplete  Aerobic/Anaerobic Culture (surgical/deep wound)     Status: None (Preliminary result)   Collection Time: 12/28/17  5:15 AM  Result Value Ref Range Status   Specimen Description   Final    WOUND RIGHT SHOULDER Performed at [facility], [address], [place], [place] [address]    Special Requests   Final    NONE Performed at [facility], [address], [place], [place] [address]    Gram Stain   Final    RARE WBC PRESENT, PREDOMINANTLY PMN RARE GRAM POSITIVE COCCI IN PAIRS Performed at [facility], [address] [place], [place] [address]    Culture PENDING  Incomplete   Report Status PENDING  Incomplete  Aerobic/Anaerobic Culture (surgical/deep wound)     Status: None (Preliminary result)    Collection Time: 12/28/17  5:33 AM  Result Value Ref Range Status   Specimen Description   Final    TISSUE RIGHT SHOULDER Performed at [facility], [address], [place], [place] [address]    Special Requests   Final    NONE Performed at [facility], [address] [place], [place] [address]    Gram Stain   Final    RARE WBC PRESENT, PREDOMINANTLY PMN RARE GRAM POSITIVE COCCI IN PAIRS RARE GRAM NEGATIVE RODS Performed at [facility], [address] [place], [place] [address]    Culture PENDING  Incomplete   Report Status PENDING  Incomplete         Radiology Studies: Dg Shoulder Right  Result Date: 12/27/2017 CLINICAL DATA:  RIGHT shoulder pain for 5 days, possible repetitive stress injury. EXAM: RIGHT SHOULDER - 2 VIEW; RIGHT HUMERUS - 2+ VIEW COMPARISON:  None. FINDINGS: RIGHT shoulder: The humeral head is well-formed and located. The subacromial, glenohumeral and acromioclavicular joint spaces are intact. No destructive bony lesions. Soft tissue planes are non-suspicious. RIGHT humerus: No acute fracture deformity or dislocation. No destructive bony lesions. Soft tissue planes are not suspicious. IMPRESSION: Negative. Electronically Signed   By: Awilda Metro M.D.   On: 12/27/2017 14:56   Mr Shoulder Right Wo Contrast  Result Date: 12/27/2017 CLINICAL DATA:  Severe right shoulder pain. Sepsis. History of IV drug abuse. EXAM: MRI OF THE RIGHT SHOULDER WITHOUT CONTRAST TECHNIQUE: Multiplanar, multisequence MR imaging of the shoulder was performed. No intravenous contrast was administered. COMPARISON:  Right shoulder x-rays from same day. FINDINGS: Rotator cuff:  Intact rotator cuff. Muscles: No atrophy or abnormal signal of the muscles of the rotator cuff. Edema within the distal trapezius muscle and proximal deltoid muscle near the clavicular attachment. Biceps long head:  Intact and normally positioned. Acromioclavicular Joint: Small  amount of fluid within the acromioclavicular joint with prominent periarticular soft tissue inflammatory changes. Type I acromion. Trace fluid in the subacromial/subdeltoid bursa. Glenohumeral Joint: No joint effusion. No chondral defect. Labrum: Grossly intact, but evaluation is limited by lack of intraarticular fluid. Bones:  No marrow abnormality, fracture or dislocation. Other: No fluid collection. IMPRESSION: 1. Findings concerning for acromioclavicular joint septic arthritis with  small joint effusion and prominent periarticular soft tissue inflammatory changes involving the distal trapezius muscle and proximal deltoid muscle near the clavicular attachment. 2. No evidence of osteomyelitis.  No abscess. 3. Trace subacromial/subdeltoid bursal fluid is likely reactive. 4. No glenohumeral joint effusion. Electronically Signed   By: Obie DredgeWilliam T Derry M.D.   On: 12/27/2017 22:59   Dg Humerus Right  Result Date: 12/27/2017 CLINICAL DATA:  RIGHT shoulder pain for 5 days, possible repetitive stress injury. EXAM: RIGHT SHOULDER - 2 VIEW; RIGHT HUMERUS - 2+ VIEW COMPARISON:  None. FINDINGS: RIGHT shoulder: The humeral head is well-formed and located. The subacromial, glenohumeral and acromioclavicular joint spaces are intact. No destructive bony lesions. Soft tissue planes are non-suspicious. RIGHT humerus: No acute fracture deformity or dislocation. No destructive bony lesions. Soft tissue planes are not suspicious. IMPRESSION: Negative. Electronically Signed   By: Awilda Metroourtnay  Bloomer M.D.   On: 12/27/2017 14:56        Scheduled Meds: . buPROPion  300 mg Oral Daily  . cloNIDine  0.1 mg Oral QID   Followed by  . [START ON 12/30/2017] cloNIDine  0.1 mg Oral BH-qamhs   Followed by  . [START ON 01/02/2018] cloNIDine  0.1 mg Oral QAC breakfast  . docusate sodium  100 mg Oral BID  . enoxaparin (LOVENOX) injection  40 mg Subcutaneous QHS  . mirtazapine  15 mg Oral QHS  . sodium chloride flush  3 mL Intravenous  Q12H   Continuous Infusions: . sodium chloride 150 mL/hr at 12/28/17 1235  .  ceFAZolin (ANCEF) IV 2 g (12/29/17 0226)  . magnesium sulfate 1 - 4 g bolus IVPB 2 g (12/29/17 0931)  . methocarbamol (ROBAXIN) IV Stopped (12/28/17 1154)     LOS: 2 days        Glade LloydKshitiz Savior Himebaugh, MD [facility] Pager [phone]  If 7PM-7AM, please contact night-coverage [email or web address] Password [email or web address] 12/29/2017, 10:08 AM

## 2017-12-29 NOTE — Progress Notes (Signed)
Consult- Transportation needs/ SA treatment/Homelessnes   CSW met with the patient at bedside. CSW explain role and reason for visit. Patient reports he is in severe pain and request if CSW can come back later when he is feeling better.   CSW will follow up with the patient later   Hannah Beat, MSW Clinical Social Worker  [phone] 12/29/2017  10:04 AM

## 2017-12-29 NOTE — Evaluation (Signed)
Occupational Therapy Evaluation Patient Details Name: Logan Hancock MRN: [id number] DOB: 08/15/1990 Today's Date: 12/29/2017    History of Present Illness s/p I & D of R shoulder/AC joint, r/o sepsis.  H/O drug use, bipolar, MRSA   Clinical Impression   This 27 year old man was admitted for the above.  Educated on shoulder protocol and positioned arm better in bed.  Assessed only from bed level due to pain. Will continue follow with supervision to min A level goals    Follow Up Recommendations  Follow surgeon's recommendation for DC plan and follow-up therapies(?SNF)    Equipment Recommendations  (tba further)    Recommendations for Other Services       Precautions / Restrictions Precautions Precautions: Shoulder Type of Shoulder Precautions: sling on except for bathing, dressing, exercises.  OK to move elbow to fingers; P/AROM of FF to 90, abd to 60 and ER to 30 Precaution Booklet Issued: Yes (comment) Restrictions Weight Bearing Restrictions: Yes Other Position/Activity Restrictions: NWB      Mobility Bed Mobility                  Transfers                      Balance                                           ADL either performed or assessed with clinical judgement   ADL Overall ADL's : Needs assistance/impaired Eating/Feeding: Set up   Grooming: Set up   Upper Body Bathing: Maximal assistance   Lower Body Bathing: Maximal assistance   Upper Body Dressing : Maximal assistance   Lower Body Dressing: Total assistance                 General ADL Comments: educated on protocol and exercise restrictions.  Pt with increased pain and not agreeable to OOB at this time. Worked with positioning RUE more comfortably in bed     Vision         Perception     Praxis      Pertinent Vitals/Pain Pain Assessment: 0-10 Pain Score: 8  Pain Location: mostly headache and sore shoulder     Hand Dominance      Extremity/Trunk Assessment Upper Extremity Assessment Upper Extremity Assessment: RUE deficits/detail(in sling; would only move fingers)           Communication Communication Communication: No difficulties   Cognition Arousal/Alertness: Awake/alert Behavior During Therapy: WFL for tasks assessed/performed Overall Cognitive Status: No family/caregiver present to determine baseline cognitive functioning                                 General Comments: pt repeating himself. Frustrated that he can't take a shower   General Comments  reviewed shoulder protocol, exercise restrictions    Exercises     Shoulder Instructions      Home Living                                   Additional Comments: pt states he has no where to go. Was staying with a friend      Prior Functioning/Environment Level of Independence: Independent  OT Problem List: Decreased strength;Decreased range of motion;Decreased activity tolerance;Pain;Impaired UE functional use      OT Treatment/Interventions: Self-care/ADL training;DME and/or AE instruction;Patient/family education;Therapeutic exercise    OT Goals(Current goals can be found in the care plan section) Acute Rehab OT Goals Patient Stated Goal: take a shower OT Goal Formulation: With patient Time For Goal Achievement: 01/12/18 Potential to Achieve Goals: Good ADL Goals Pt Will Perform Upper Body Bathing: with set-up;sitting Pt Will Perform Lower Body Bathing: with supervision;sit to/from stand;with adaptive equipment Pt Will Perform Upper Body Dressing: with min assist;sitting Pt Will Perform Lower Body Dressing: with min assist;sit to/from stand;with adaptive equipment Pt Will Transfer to Toilet: with supervision;ambulating;regular height toilet;bedside commode Pt Will Perform Toileting - Clothing Manipulation and hygiene: with supervision;sit to/from stand Additional ADL Goal #1: pt will  perform A/PROM exercises as tolerated within allowed range with supervision  OT Frequency: Min 2X/week   Barriers to D/C:            Co-evaluation              AM-PAC OT "6 Clicks" Daily Activity     Outcome Measure Help from another person eating meals?: A Little Help from another person taking care of personal grooming?: A Little Help from another person toileting, which includes using toliet, bedpan, or urinal?: A Lot Help from another person bathing (including washing, rinsing, drying)?: A Lot Help from another person to put on and taking off regular upper body clothing?: A Lot Help from another person to put on and taking off regular lower body clothing?: A Lot 6 Click Score: 14   End of Session    Activity Tolerance: Patient limited by pain Patient left: in bed  OT Visit Diagnosis: Pain Pain - Right/Left: Right Pain - part of body: Shoulder                Time: [id number] OT Time Calculation (min): 14 min Charges:  OT General Charges $OT Visit: 1 Visit OT Evaluation $OT Eval Low Complexity: 1 Low  Logan OtterMaryellen Marcus Hancock, OTR/L Acute Rehabilitation Services [phone] [facility] pager [phone] office 12/29/2017  Logan Hancock 12/29/2017, 12:14 PM

## 2017-12-29 NOTE — Progress Notes (Signed)
   12/29/17 1219  OT Visit Information  Last OT Received On 12/29/17  Assistance Needed +1  History of Present Illness s/p I & D of R shoulder/AC joint, r/o sepsis.  H/O drug use, bipolar, MRSA  Precautions  Precautions Shoulder  Type of Shoulder Precautions sling on except for bathing, dressing, exercises.  OK to move elbow to fingers; P/AROM of FF to 90, abd to 60 and ER to 30  Precaution Booklet Issued Yes (comment)  Pain Assessment  Pain Assessment 0-10  Pain Score 6  Pain Location headache and R shoulder  Pain Descriptors / Indicators Sore;Aching  Pain Intervention(s) Limited activity within patient's tolerance;Monitored during session;Premedicated before session;Repositioned;Ice applied  Cognition  Arousal/Alertness Awake/alert  Behavior During Therapy WFL for tasks assessed/performed  Overall Cognitive Status No family/caregiver present to determine baseline cognitive functioning  General Comments mostly wfls; needs reinforcement with precautions  ADL  Grooming Oral care;Supervision/safety;Standing  Upper Body Dressing  Maximal assistance;Sitting  General ADL Comments ambulated to bathroom. Steady once on his feet.  Stood to urinate at [place] level  Bed Mobility  Overal bed mobility Needs Assistance  General bed mobility comments min A for OOB with HOB raised  Balance  Overall balance assessment No apparent balance deficits (not formally assessed)  Restrictions  Other Position/Activity Restrictions NWB RUE  Transfers  Overall transfer level Needs assistance  Transfers Sit to/from Stand  Sit to Stand Min assist  General transfer comment Min A to rise and steady  General Comments  General comments (skin integrity, edema, etc.) reviewed protocol again and left this and exercise sheets in room  Exercises  Exercises Other exercises  Other Exercises  Other Exercises gentle prom for FF to 20, abd to 30, ER to 10 as tolerated. Pt performed PROM of elbow and AROM wrist and  fingers  OT - End of Session  Activity Tolerance Patient tolerated treatment well  Patient left in chair;with call bell/phone within reach  OT Assessment/Plan  OT Visit Diagnosis Pain  Pain - Right/Left Right  Pain - part of body Shoulder  OT Frequency (ACUTE ONLY) Min 2X/week  Follow Up Recommendations Follow surgeon's recommendation for DC plan and follow-up therapies (?SNF)  OT Equipment  (to be further assessed. Pt is 6'4")  AM-PAC OT "6 Clicks" Daily Activity Outcome Measure (Version 2)  Help from another person eating meals? 3  Help from another person taking care of personal grooming? 3  Help from another person toileting, which includes using toliet, bedpan, or urinal? 3  Help from another person bathing (including washing, rinsing, drying)? 2  Help from another person to put on and taking off regular upper body clothing? 2  Help from another person to put on and taking off regular lower body clothing? 2  6 Click Score 15  OT Goal Progression  Progress towards OT goals Progressing toward goals  OT Time Calculation  OT Start Time (ACUTE ONLY) 1109  OT Stop Time (ACUTE ONLY) 1156  OT Time Calculation (min) 47 min  OT General Charges  $OT Visit 1 Visit  OT Treatments  $Self Care/Home Management  23-37 mins  $Therapeutic Exercise 8-22 mins  Marica OtterMaryellen Doninique Lwin, OTR/L Acute Rehabilitation Services [phone] [facility] pager [phone] office 12/29/2017

## 2017-12-30 DIAGNOSIS — F1120 Opioid dependence, uncomplicated: Secondary | ICD-10-CM

## 2017-12-30 LAB — COMPREHENSIVE METABOLIC PANEL
ALT: 62 U/L — ABNORMAL HIGH (ref 0–44)
AST: 64 U/L — ABNORMAL HIGH (ref 15–41)
Albumin: 2.7 g/dL — ABNORMAL LOW (ref 3.5–5.0)
Alkaline Phosphatase: 144 U/L — ABNORMAL HIGH (ref 38–126)
Anion gap: 17 — ABNORMAL HIGH (ref 5–15)
BUN: 10 mg/dL (ref 6–20)
CO2: 13 mmol/L — ABNORMAL LOW (ref 22–32)
Calcium: 7.9 mg/dL — ABNORMAL LOW (ref 8.9–10.3)
Chloride: 107 mmol/L (ref 98–111)
Creatinine, Ser: 0.85 mg/dL (ref 0.61–1.24)
GFR calc Af Amer: >60 mL/min (ref >60)
GFR calc non Af Amer: >60 mL/min (ref >60)
Glucose, Bld: 124 mg/dL — ABNORMAL HIGH (ref 70–99)
POTASSIUM: 4 mmol/L (ref 3.5–5.1)
Sodium: 137 mmol/L (ref 135–145)
Total Bilirubin: 3.2 mg/dL — ABNORMAL HIGH (ref 0.3–1.2)
Total Protein: 6 g/dL — ABNORMAL LOW (ref 6.5–8.1)

## 2017-12-30 LAB — CBC WITH DIFFERENTIAL/PLATELET
ABS IMMATURE GRANULOCYTES: 0.06 10*3/uL (ref 0.00–0.07)
Basophils Absolute: 0 10*3/uL (ref 0.0–0.1)
Basophils Relative: 0 %
Eosinophils Absolute: 0.4 10*3/uL (ref 0.0–0.5)
Eosinophils Relative: 3 %
HCT: 39.4 % (ref 39.0–52.0)
Hemoglobin: 12.2 g/dL — ABNORMAL LOW (ref 13.0–17.0)
Immature Granulocytes: 1 %
Lymphocytes Relative: 12 %
Lymphs Abs: 1.5 10*3/uL (ref 0.7–4.0)
MCH: 25.7 pg — AB (ref 26.0–34.0)
MCHC: 31 g/dL (ref 30.0–36.0)
MCV: 83.1 fL (ref 80.0–100.0)
MONO ABS: 0.8 10*3/uL (ref 0.1–1.0)
Monocytes Relative: 7 %
Neutro Abs: 9.8 10*3/uL — ABNORMAL HIGH (ref 1.7–7.7)
Neutrophils Relative %: 77 %
Platelets: 165 10*3/uL (ref 150–400)
RBC: 4.74 MIL/uL (ref 4.22–5.81)
RDW: 16.3 % — ABNORMAL HIGH (ref 11.5–15.5)
WBC: 12.6 10*3/uL — AB (ref 4.0–10.5)
nRBC: 0 % (ref 0.0–0.2)

## 2017-12-30 LAB — CULTURE, BLOOD (ROUTINE X 2)

## 2017-12-30 LAB — MAGNESIUM: Magnesium: 2.2 mg/dL (ref 1.7–2.4)

## 2017-12-30 NOTE — Progress Notes (Signed)
Occupational Therapy Treatment Patient Details Name: Logan Hancock MRN: [id number] DOB: 05/21/1990 Today's Date: 12/30/2017    History of present illness s/p I & Hancock of R shoulder/AC joint, r/o sepsis.  H/O drug use, bipolar, MRSA   OT comments  Pt in bed and did participate with OT.  OT educated pt on importance of positioining in bed. Pt did not have RUE elevated or sling on upon OT arrival.     Follow Up Recommendations  Follow surgeon's recommendation for DC plan and follow-up therapies(?SNF)    Equipment Recommendations  (to be further assessed. Pt is 6'4")    Recommendations for Other Services      Precautions / Restrictions Precautions Precautions: Shoulder Type of Shoulder Precautions: sling on except for bathing, dressing, exercises.  OK to move elbow to fingers; P/AROM of FF to 90, abd to 60 and ER to 30 Precaution Booklet Issued: Yes (comment) Restrictions Other Position/Activity Restrictions: NWB RUE              ADL either performed or assessed with clinical judgement   ADL             did not perform                                             Cognition Arousal/Alertness: Awake/alert Behavior During Therapy: WFL for tasks assessed/performed Overall Cognitive Status: No family/caregiver present to determine baseline cognitive functioning                                 General Comments: mostly wfls; needs reinforcement with precautions        Exercises Other Exercises Other Exercises: gentle prom for FF to 20, abd to 30, ER to 10 as tolerated. Pt performed PROM of elbow and AROM wrist and fingers.  Educated pt in self ROM for RUE using LUE to Assist   Shoulder Instructions       General Comments      Pertinent Vitals/ Pain       Pain Score: 4  Pain Location: headache and R shoulder Pain Descriptors / Indicators: Sore;Aching Pain Intervention(s): Limited activity within patient's tolerance;Repositioned      Prior Functioning/Environment              Frequency  Min 2X/week        Progress Toward Goals  OT Goals(current goals can now be found in the care plan section)  Progress towards OT goals: Progressing toward goals     Plan Discharge plan remains appropriate       AM-PAC OT "6 Clicks" Daily Activity     Outcome Measure   Help from another person eating meals?: A Little Help from another person taking care of personal grooming?: A Little Help from another person toileting, which includes using toliet, bedpan, or urinal?: A Little Help from another person bathing (including washing, rinsing, drying)?: A Lot Help from another person to put on and taking off regular upper body clothing?: A Lot Help from another person to put on and taking off regular lower body clothing?: A Lot 6 Click Score: 15    End of Session    OT Visit Diagnosis: Pain Pain - Right/Left: Right Pain - part of body: Shoulder   Activity Tolerance Patient tolerated treatment well  Patient Left in chair;with call bell/phone within reach   Nurse Communication Mobility status           Charges: OT General Charges $OT Visit: 1 Visit OT Treatments $Therapeutic Exercise: 8-22 mins  Lise AuerLori Akeira Lahm, [place] Acute Rehabilitation Services [phone] Office- [phone]      Logan Hancock, Logan Hancock 12/30/2017, 1:56 PM

## 2017-12-30 NOTE — Progress Notes (Signed)
   Subjective: 2 Days Post-Op Procedure(s) (LRB): ARTHROSCOPY SHOULDER  incision and drainage right shoulder and open incision and drainage shoulder joint (Right)  C/o mild to moderate pain in the right shoulder Denies any numbness or tingling distally Otherwise doing fair Patient reports pain as moderate.  Objective:   VITALS:   Vitals:   12/29/17 2123 12/30/17 0553  BP: 137/73 (!) 147/74  Pulse: 89 92  Resp: 16   Temp: 98.7 F (37.1 C) 98.7 F (37.1 C)  SpO2: 100% 99%    Right shoulder: well healing incisions nv intact distally Still guarded rom No rashes or edema distally  LABS Recent Labs    12/28/17 0920 12/29/17 0534 12/30/17 0511  HGB 12.7* 12.0* 12.2*  HCT 39.8 37.6* 39.4  WBC 16.3* 8.8 12.6*  PLT 156 100* 165    Recent Labs    12/28/17 0920 12/29/17 0534 12/30/17 0511  NA 131* 135 137  K 4.3 3.4* 4.0  BUN 31* 22* 10  CREATININE 2.42* 1.41* 0.85  GLUCOSE 127* 116* 124*     Assessment/Plan: 2 Days Post-Op Procedure(s) (LRB): ARTHROSCOPY SHOULDER  incision and drainage right shoulder and open incision and drainage shoulder joint (Right) Will continue monitor his progress Recommend continued antibiotics Pain management     Brad Antonieta Ibaixon PA-C, MPAS [facility]  [place] [address], [name], [place] [address] Phone: [phone] [email or web address] Facebook  [employer]  LinkedIn  Twitter

## 2017-12-30 NOTE — Progress Notes (Signed)
Patient ID: FINCH COSTANZO, male   DOB: September 17, 1990, 27 y.o.   MRN: [id number]  PROGRESS NOTE    PAXON PROPES  WUJ:[id number] DOB: [birth year] DOA: 12/27/2017 PCP: Patient, No Pcp Per   Brief Narrative:  27 year old male with history of polysubstance abuse including cocaine, IV heroin and marijuana; bipolar disorder presented with 5 days of right shoulder pain and swelling.  He was found to be febrile in the ED with elevated CRP.  Orthopedics was consulted for probable septic right shoulder.  MRI revealed signs concerning for septic arthritis.  Patient was started on antibiotics.  He was also found to have staph bacteremia, ID was consulted.  12/30/2017: Available records reviewed.  Patient seen.  No new complaints.  No documented fever or chills.  Infectious disease and orthopedic input appreciated.  No chest pain.  No nausea or vomiting.   Assessment & Plan:   Active Problems:   Polysubstance abuse (HCC)   Heroin use disorder, severe (HCC)   Sepsis (HCC)   Septic arthritis of shoulder, right (HCC)   Hyponatremia   Infection of shoulder (HCC)  Sepsis secondary to septic joint -Antibiotics plan as below.  Decrease normal saline to 100 cc an hour.  Blood pressure improving.  Monitor 12/30/2017: IV antibiotics as per infectious disease team.  For possible TEE.  Right acromioclavicular joint septic arthritis -Confirmed by MRI. -Status post I&D on 12/28/2017.  Follow OR cultures; Gram stain showing gram-positive cocci in pairs.   -Initially started on broad-spectrum antibiotics which were switched to IV Ancef by ID on 12/28/2017. 12/30/2017: Complete course of antibiotics.  Leukocytosis -Probably secondary to above.  Resolved.  Acute kidney injury  -Probably secondary to above.  Improving.  Repeat a.m. labs.  IV fluids plan as above. 12/30/2017: Resolved.  Continue to monitor renal function and electrolytes.   Staph bacteremia -BCID suggests MSSA bacteremia.  Follow cultures and  sensitivities.  Follow blood cultures from today. -2D echo was negative for any vegetation. 12/30/2017: For TEE.  Polysubstance abuse  -Reports recent use of heroin and methamphetamines -Monitor for withdrawal.  Continue opiate withdrawal protocol -Social worker consult for substance abuse  Thrombocytopenia -Monitor.  No signs of bleeding. 12/30/2017: Resolved.  Hypokalemia -Replace.  Repeat a.m. labs 12/30/2017: Resolved.   Hypomagnesemia -Replace.  Repeat a.m. labs. 12/30/2017: Resolved  Hypo-natremia/hypochloremia -Improved.  Monitor 12/30/2017: Resolved  Bipolar disorder -Patient apparently is no longer on medications for bipolar disorder.  Outpatient follow-up  History of asthma -Albuterol as needed.   DVT prophylaxis: Lovenox Code Status: Full Family Communication: None at bedside Disposition Plan: Depends on clinical outcome  Consultants: Orthopedics.  ID  Procedures: I&D of right acromioclavicular joint Echo on 12/28/17 Study Conclusions  - Procedure narrative: Transthoracic echocardiography. Image   quality was suboptimal. The study was technically difficult, as a   result of restricted patient mobility. - Left ventricle: The cavity size was normal. Wall thickness was   normal. Systolic function was normal. The estimated ejection   fraction was in the range of 60% to 65%. Although no diagnostic   regional wall motion abnormality was identified, this possibility   cannot be completely excluded on the basis of this study. The   study is not technically sufficient to allow evaluation of LV   diastolic function. - Aortic valve: Transvalvular velocity was within the normal range.   There was no stenosis. There was no regurgitation. - Mitral valve: There was trivial regurgitation. - Left atrium: The atrium was normal in size. -  Right ventricle: The cavity size was normal. Wall thickness was   normal. Systolic function was normal. - Right atrium: The  atrium was normal in size. Central venous   pressure (est): 15 mm Hg. - Tricuspid valve: There was trivial regurgitation. - Pulmonic valve: There was trivial regurgitation. - Pulmonary arteries: Systolic pressure could not be accurately   estimated. - Inferior vena cava: The vessel was dilated. The respirophasic   diameter changes were blunted (< 50%). - Pericardium, extracardiac: There was no pericardial effusion.  Impressions:  - There was no evidence of a vegetation.  Recommendations:  Image quality suboptimal for detection of small vegetations. Consider TEE if clinically indicated.   Antimicrobials: Cefepime, Flagyl, vancomycin from 12/27/2017-12/28/17 Cefazolin from 12/28/2017 onwards   Subjective: Patient seen  No fever or chills.   No shortness of breath   Objective: Vitals:   12/29/17 1405 12/29/17 1739 12/29/17 2123 12/30/17 0553  BP: (!) 146/80 (!) 144/71 137/73 (!) 147/74  Pulse: (!) 101 98 89 92  Resp: 18 18 16    Temp: 98.2 F (36.8 C) 98.2 F (36.8 C) 98.7 F (37.1 C) 98.7 F (37.1 C)  TempSrc: Oral Oral    SpO2: 100% 97% 100% 99%  Weight:      Height:        Intake/Output Summary (Last 24 hours) at 12/30/2017 1109 Last data filed at 12/30/2017 0600 Gross per 24 hour  Intake 4479.98 ml  Output -  Net 4479.98 ml   Filed Weights   12/27/17 1407  Weight: 104.3 kg    Examination:  General exam: Awake, answers some questions.  Poor historian. Respiratory system: Bilateral decreased breath sounds at bases, no wheezing Cardiovascular system: S1 & S2 heard, intermittent tachycardia Gastrointestinal system: Abdomen is nondistended, soft and nontender. Normal bowel sounds heard. Extremities: No cyanosis, clubbing, edema.  Right shoulder dressing present    Data Reviewed: I have personally reviewed following labs and imaging studies  CBC: Recent Labs  Lab 12/27/17 1619 12/28/17 0920 12/29/17 0534 12/30/17 0511  WBC 9.8 16.3* 8.8 12.6*    NEUTROABS 9.4*  --  7.3 9.8*  HGB 14.3 12.7* 12.0* 12.2*  HCT 44.9 39.8 37.6* 39.4  MCV 82.7 82.2 82.1 83.1  PLT 156 156 100* 165   Basic Metabolic Panel: Recent Labs  Lab 12/27/17 1619 12/28/17 0920 12/29/17 0534 12/30/17 0511  NA 130* 131* 135 137  K 3.9 4.3 3.4* 4.0  CL 94* 102 103 107  CO2 23 15* 18* 13*  GLUCOSE 125* 127* 116* 124*  BUN 11 31* 22* 10  CREATININE 0.89 2.42* 1.41* 0.85  CALCIUM 8.8* 6.9* 7.3* 7.9*  MG  --   --  1.6* 2.2   GFR: Estimated Creatinine Clearance: 176.3 mL/min (by C-G formula based on SCr of 0.85 mg/dL). Liver Function Tests: Recent Labs  Lab 12/27/17 1619 12/29/17 0534 12/30/17 0511  AST 27 56* 64*  ALT 25 47* 62*  ALKPHOS 63 93 144*  BILITOT 4.1* 4.0* 3.2*  PROT 7.6 5.6* 6.0*  ALBUMIN 4.0 2.7* 2.7*   No results for input(s): LIPASE, AMYLASE in the last 168 hours. No results for input(s): AMMONIA in the last 168 hours. Coagulation Profile: Recent Labs  Lab 12/27/17 1619  INR 1.10   Cardiac Enzymes: No results for input(s): CKTOTAL, CKMB, CKMBINDEX, TROPONINI in the last 168 hours. BNP (last 3 results) No results for input(s): PROBNP in the last 8760 hours. HbA1C: No results for input(s): HGBA1C in the last 72 hours. CBG: No  results for input(s): GLUCAP in the last 168 hours. Lipid Profile: No results for input(s): CHOL, HDL, LDLCALC, TRIG, CHOLHDL, LDLDIRECT in the last 72 hours. Thyroid Function Tests: No results for input(s): TSH, T4TOTAL, FREET4, T3FREE, THYROIDAB in the last 72 hours. Anemia Panel: No results for input(s): VITAMINB12, FOLATE, FERRITIN, TIBC, IRON, RETICCTPCT in the last 72 hours. Sepsis Labs: Recent Labs  Lab 12/27/17 1630  LATICACIDVEN 1.18    Recent Results (from the past 240 hour(s))  Blood culture (routine x 2)     Status: Abnormal   Collection Time: 12/27/17  4:19 PM  Result Value Ref Range Status   Specimen Description   Final    BLOOD LEFT ANTECUBITAL Performed at [facility], [address], [place], [place] [address]    Special Requests   Final    BOTTLES DRAWN AEROBIC AND ANAEROBIC Blood Culture results may not be optimal due to an excessive volume of blood received in culture bottles Performed at [facility], [address] [place], [place] [address]    Culture  Setup Time   Final    GRAM POSITIVE COCCI IN CLUSTERS IN BOTH AEROBIC AND ANAEROBIC BOTTLES CRITICAL RESULT CALLED TO, READ BACK BY AND VERIFIED WITH: D. Wofford PharmD 10:25 12/28/17 ([email or web address]) Performed at [facility], [address], [place], [place] [address]    Culture STAPHYLOCOCCUS AUREUS (A)  Final   Report Status 12/30/2017 FINAL  Final   Organism ID, Bacteria STAPHYLOCOCCUS AUREUS  Final      Susceptibility   Staphylococcus aureus - MIC*    CIPROFLOXACIN <=0.5 SENSITIVE Sensitive     ERYTHROMYCIN <=0.25 SENSITIVE Sensitive     GENTAMICIN <=0.5 SENSITIVE Sensitive     OXACILLIN 0.5 SENSITIVE Sensitive     TETRACYCLINE <=1 SENSITIVE Sensitive     VANCOMYCIN <=0.5 SENSITIVE Sensitive     TRIMETH/SULFA <=10 SENSITIVE Sensitive     CLINDAMYCIN <=0.25 SENSITIVE Sensitive     RIFAMPIN <=0.5 SENSITIVE Sensitive     Inducible Clindamycin NEGATIVE Sensitive     * STAPHYLOCOCCUS AUREUS  Blood Culture ID Panel (Reflexed)     Status: Abnormal   Collection Time: 12/27/17  4:19 PM  Result Value Ref Range Status   Enterococcus species NOT DETECTED NOT DETECTED Final   Listeria monocytogenes NOT DETECTED NOT DETECTED Final   Staphylococcus species DETECTED (A) NOT DETECTED Final    Comment: CRITICAL RESULT CALLED TO, READ BACK BY AND VERIFIED WITH: D. Wofford PharmD 10:25 12/28/17 ([email or web address])    Staphylococcus aureus (BCID) DETECTED (A) NOT DETECTED Final    Comment: Methicillin (oxacillin) susceptible Staphylococcus aureus (MSSA). Preferred therapy is anti staphylococcal beta lactam antibiotic (Cefazolin or Nafcillin), unless clinically  contraindicated. CRITICAL RESULT CALLED TO, READ BACK BY AND VERIFIED WITH: D. Wofford PharmD 10:25 12/28/17 ([email or web address])    Methicillin resistance NOT DETECTED NOT DETECTED Final   Streptococcus species NOT DETECTED NOT DETECTED Final   Streptococcus agalactiae NOT DETECTED NOT DETECTED Final   Streptococcus pneumoniae NOT DETECTED NOT DETECTED Final   Streptococcus pyogenes NOT DETECTED NOT DETECTED Final   Acinetobacter baumannii NOT DETECTED NOT DETECTED Final   Enterobacteriaceae species NOT DETECTED NOT DETECTED Final   Enterobacter cloacae complex NOT DETECTED NOT DETECTED Final   Escherichia coli NOT DETECTED NOT DETECTED Final   Klebsiella oxytoca NOT DETECTED NOT DETECTED Final   Klebsiella pneumoniae NOT DETECTED NOT DETECTED Final   Proteus species NOT DETECTED NOT DETECTED Final   Serratia marcescens NOT DETECTED NOT  DETECTED Final   Haemophilus influenzae NOT DETECTED NOT DETECTED Final   Neisseria meningitidis NOT DETECTED NOT DETECTED Final   Pseudomonas aeruginosa NOT DETECTED NOT DETECTED Final   Candida albicans NOT DETECTED NOT DETECTED Final   Candida glabrata NOT DETECTED NOT DETECTED Final   Candida krusei NOT DETECTED NOT DETECTED Final   Candida parapsilosis NOT DETECTED NOT DETECTED Final   Candida tropicalis NOT DETECTED NOT DETECTED Final    Comment: Performed at [facility], [address] [place], [place] [address]  Blood culture (routine x 2)     Status: Abnormal   Collection Time: 12/27/17  4:35 PM  Result Value Ref Range Status   Specimen Description   Final    BLOOD LEFT HAND Performed at [facility], [address] [place], [place] [address]    Special Requests   Final    BOTTLES DRAWN AEROBIC ONLY Blood Culture results may not be optimal due to an inadequate volume of blood received in culture bottles Performed at [facility], [address] [place], [place] [address]    Culture  Setup Time   Final     GRAM POSITIVE COCCI AEROBIC BOTTLE ONLY CRITICAL VALUE NOTED.  VALUE IS CONSISTENT WITH PREVIOUSLY REPORTED AND CALLED VALUE.    Culture (A)  Final    STAPHYLOCOCCUS AUREUS SUSCEPTIBILITIES PERFORMED ON PREVIOUS CULTURE WITHIN THE LAST 5 DAYS. Performed at [facility], [address] [place], [place] [address]    Report Status 12/30/2017 FINAL  Final  Aerobic/Anaerobic Culture (surgical/deep wound)     Status: None (Preliminary result)   Collection Time: 12/28/17  5:15 AM  Result Value Ref Range Status   Specimen Description   Final    WOUND RIGHT SHOULDER Performed at [facility], [address] [place], [place] [address]    Special Requests   Final    NONE Performed at [facility], [address] [place], [place] [address]    Gram Stain   Final    RARE WBC PRESENT, PREDOMINANTLY PMN RARE GRAM POSITIVE COCCI IN PAIRS Performed at [facility], [address] [place], [place] [address]    Culture FEW STAPHYLOCOCCUS AUREUS  Final   Report Status PENDING  Incomplete   Organism ID, Bacteria STAPHYLOCOCCUS AUREUS  Final      Susceptibility   Staphylococcus aureus - MIC*    CIPROFLOXACIN <=0.5 SENSITIVE Sensitive     ERYTHROMYCIN <=0.25 SENSITIVE Sensitive     GENTAMICIN <=0.5 SENSITIVE Sensitive     OXACILLIN 0.5 SENSITIVE Sensitive     TETRACYCLINE <=1 SENSITIVE Sensitive     VANCOMYCIN 1 SENSITIVE Sensitive     TRIMETH/SULFA <=10 SENSITIVE Sensitive     CLINDAMYCIN <=0.25 SENSITIVE Sensitive     RIFAMPIN <=0.5 SENSITIVE Sensitive     Inducible Clindamycin NEGATIVE Sensitive     * FEW STAPHYLOCOCCUS AUREUS  Aerobic/Anaerobic Culture (surgical/deep wound)     Status: None (Preliminary result)   Collection Time: 12/28/17  5:33 AM  Result Value Ref Range Status   Specimen Description   Final    TISSUE RIGHT SHOULDER Performed at [facility], [address] [place], [place] [address]    Special Requests   Final     NONE Performed at [facility], [address] [place], [place] [address]    Gram Stain   Final    RARE WBC PRESENT, PREDOMINANTLY PMN RARE Romie MinusGRAM  POSITIVE COCCI IN PAIRS RARE GRAM NEGATIVE RODS Performed at [facility], [address] [place], [place] [address]    Culture FEW STAPHYLOCOCCUS AUREUS  Final   Report Status PENDING  Incomplete   Organism ID, Bacteria STAPHYLOCOCCUS AUREUS  Final      Susceptibility   Staphylococcus aureus - MIC*    CIPROFLOXACIN <=0.5 SENSITIVE Sensitive     ERYTHROMYCIN <=0.25 SENSITIVE Sensitive     GENTAMICIN <=0.5 SENSITIVE Sensitive     OXACILLIN 0.5 SENSITIVE Sensitive     TETRACYCLINE <=1 SENSITIVE Sensitive     VANCOMYCIN 1 SENSITIVE Sensitive     TRIMETH/SULFA <=10 SENSITIVE Sensitive     CLINDAMYCIN <=0.25 SENSITIVE Sensitive     RIFAMPIN <=0.5 SENSITIVE Sensitive     Inducible Clindamycin NEGATIVE Sensitive     * FEW STAPHYLOCOCCUS AUREUS  Culture, blood (routine x 2)     Status: None (Preliminary result)   Collection Time: 12/29/17  5:34 AM  Result Value Ref Range Status   Specimen Description   Final    BLOOD LEFT HAND Performed at [facility], [address], [place], [place] [address]    Special Requests   Final    BOTTLES DRAWN AEROBIC AND ANAEROBIC Blood Culture adequate volume Performed at [facility], [address], [place], [place] [address]    Culture   Final    NO GROWTH 1 DAY Performed at [facility], [address] [place], [place] [address]    Report Status PENDING  Incomplete  Culture, blood (routine x 2)     Status: None (Preliminary result)   Collection Time: 12/29/17  5:34 AM  Result Value Ref Range Status   Specimen Description   Final    BLOOD LEFT HAND Performed at [facility], [address], [place], [place] [address]    Special Requests   Final    BOTTLES DRAWN AEROBIC AND ANAEROBIC Blood Culture adequate  volume Performed at [facility], [address], [place], [place] [address]    Culture   Final    NO GROWTH 1 DAY Performed at [facility], [address] [place], [place] [address]    Report Status PENDING  Incomplete         Radiology Studies: No results found.      Scheduled Meds: . buPROPion  300 mg Oral Daily  . cloNIDine  0.1 mg Oral QID   Followed by  . cloNIDine  0.1 mg Oral BH-qamhs   Followed by  . [START ON 01/02/2018] cloNIDine  0.1 mg Oral QAC breakfast  . docusate sodium  100 mg Oral BID  . enoxaparin (LOVENOX) injection  40 mg Subcutaneous QHS  . mirtazapine  15 mg Oral QHS  . sodium chloride flush  3 mL Intravenous Q12H   Continuous Infusions: . sodium chloride 100 mL/hr at 12/30/17 0600  .  ceFAZolin (ANCEF) IV 2 g (12/30/17 1027)  . methocarbamol (ROBAXIN) IV Stopped (12/28/17 1154)     LOS: 3 days   Time spent - 35 Minutes  Barnetta Chapel, MD [facility] Pager [phone] If 7PM-7AM, please contact night-coverage [email or web address] Password [email or web address] 12/30/2017, 11:09 AM

## 2017-12-31 MED ORDER — NICOTINE 21 MG/24HR TD PT24
21.0000 mg | MEDICATED_PATCH | Freq: Every day | TRANSDERMAL | Status: DC
  Administered 2017-12-31 – 2018-01-11 (×11): 21 mg via TRANSDERMAL
  Filled 2017-12-31 (×12): qty 1

## 2017-12-31 NOTE — Progress Notes (Signed)
Patient ID: Logan Hancock, male   DOB: 04/24/1990, 27 y.o.   MRN: [id number]  PROGRESS NOTE    Logan Hancock  WUJ:[id number] DOB: 12/21/1990 DOA: 12/27/2017 PCP: Patient, No Pcp Per   Brief Narrative:  27 year old male with history of polysubstance abuse including cocaine, IV heroin and marijuana; bipolar disorder presented with 5 days of right shoulder pain and swelling.  He was found to be febrile in the ED with elevated CRP.  Orthopedics was consulted for probable septic right shoulder.  MRI revealed signs concerning for septic arthritis.  Patient was started on antibiotics.  He was also found to have staph bacteremia, ID was consulted.  12/30/2017: Available records reviewed.  Patient seen.  No new complaints.  No documented fever or chills.  Infectious disease and orthopedic input appreciated.  No chest pain.  No nausea or vomiting.  12/31/2017: No complaints.  No fever or chills.  Continue IV antibiotics.  Infectious disease team.  Is appreciated.  Further management depend on hospital course.  No new complaints.   Assessment & Plan:   Active Problems:   Polysubstance abuse (HCC)   Heroin use disorder, severe (HCC)   Sepsis (HCC)   Septic arthritis of shoulder, right (HCC)   Hyponatremia   Infection of shoulder (HCC)  Sepsis secondary to septic joint -Antibiotics plan as below.  Decrease normal saline to 100 cc an hour.  Blood pressure improving.  Monitor 12/30/2017: IV antibiotics as per infectious disease team.  For possible TEE. 12/31/2017: Sepsis physiology has resolved.  Right acromioclavicular joint septic arthritis -Confirmed by MRI. -Status post I&D on 12/28/2017.  Follow OR cultures; Gram stain showing gram-positive cocci in pairs.   -Initially started on broad-spectrum antibiotics which were switched to IV Ancef by ID on 12/28/2017. 12/30/2017: Complete course of antibiotics.  Leukocytosis -Probably secondary to above.    Acute kidney injury  -Probably secondary  to above.  Improving.  Repeat a.m. labs.  IV fluids plan as above. 12/30/2017: Resolved.  Continue to monitor renal function and electrolytes.   Staph bacteremia -BCID suggests MSSA bacteremia.  Follow cultures and sensitivities.  Follow blood cultures from today. -2D echo was negative for any vegetation. 12/30/2017: For TEE.  Polysubstance abuse  -Reports recent use of heroin and methamphetamines -Monitor for withdrawal.  Continue opiate withdrawal protocol -Social worker consult for substance abuse  Thrombocytopenia -Monitor.  No signs of bleeding. 12/30/2017: Resolved.  Hypokalemia -Replace.  Repeat a.m. labs 12/30/2017: Resolved.   Hypomagnesemia -Replace.  Repeat a.m. labs. 12/30/2017: Resolved  Hypo-natremia/hypochloremia -Improved.  Monitor 12/30/2017: Resolved  Bipolar disorder -Patient apparently is no longer on medications for bipolar disorder.  Outpatient follow-up  History of asthma -Albuterol as needed.   DVT prophylaxis: Lovenox Code Status: Full Family Communication: None at bedside Disposition Plan: Depends on clinical outcome  Consultants: Orthopedics.  ID  Procedures: I&D of right acromioclavicular joint Echo on 12/28/17 Study Conclusions  - Procedure narrative: Transthoracic echocardiography. Image   quality was suboptimal. The study was technically difficult, as a   result of restricted patient mobility. - Left ventricle: The cavity size was normal. Wall thickness was   normal. Systolic function was normal. The estimated ejection   fraction was in the range of 60% to 65%. Although no diagnostic   regional wall motion abnormality was identified, this possibility   cannot be completely excluded on the basis of this study. The   study is not technically sufficient to allow evaluation of LV   diastolic function. -  Aortic valve: Transvalvular velocity was within the normal range.   There was no stenosis. There was no regurgitation. - Mitral  valve: There was trivial regurgitation. - Left atrium: The atrium was normal in size. - Right ventricle: The cavity size was normal. Wall thickness was   normal. Systolic function was normal. - Right atrium: The atrium was normal in size. Central venous   pressure (est): 15 mm Hg. - Tricuspid valve: There was trivial regurgitation. - Pulmonic valve: There was trivial regurgitation. - Pulmonary arteries: Systolic pressure could not be accurately   estimated. - Inferior vena cava: The vessel was dilated. The respirophasic   diameter changes were blunted (< 50%). - Pericardium, extracardiac: There was no pericardial effusion.  Impressions:  - There was no evidence of a vegetation.  Recommendations:  Image quality suboptimal for detection of small vegetations. Consider TEE if clinically indicated.   Antimicrobials: Cefepime, Flagyl, vancomycin from 12/27/2017-12/28/17 Cefazolin from 12/28/2017 onwards   Subjective: Patient seen  No fever or chills.   No shortness of breath   Objective: Vitals:   12/30/17 1349 12/30/17 2151 12/31/17 0427 12/31/17 0913  BP: (!) 148/89 (!) 162/70 100/87 134/62  Pulse: 88 84 92 70  Resp: 16 20 20    Temp: 98.5 F (36.9 C) 99.2 F (37.3 C) 98.4 F (36.9 C)   TempSrc: Oral Oral Oral   SpO2: 100% 100% 99%   Weight:      Height:        Intake/Output Summary (Last 24 hours) at 12/31/2017 1214 Last data filed at 12/31/2017 1127 Gross per 24 hour  Intake 3081.63 ml  Output 1800 ml  Net 1281.63 ml   Filed Weights   12/27/17 1407  Weight: 104.3 kg    Examination:  General exam: Awake, alert, and in no obvious distress.  Respiratory system: Clear to auscultation.   Cardiovascular system: S1 & S2 heard, intermittent tachycardia Gastrointestinal system: Abdomen is nondistended, soft and nontender. Normal bowel sounds heard. Extremities: No cyanosis, clubbing, edema.  Right shoulder dressing present    Data Reviewed: I have  personally reviewed following labs and imaging studies  CBC: Recent Labs  Lab 12/27/17 1619 12/28/17 0920 12/29/17 0534 12/30/17 0511  WBC 9.8 16.3* 8.8 12.6*  NEUTROABS 9.4*  --  7.3 9.8*  HGB 14.3 12.7* 12.0* 12.2*  HCT 44.9 39.8 37.6* 39.4  MCV 82.7 82.2 82.1 83.1  PLT 156 156 100* 165   Basic Metabolic Panel: Recent Labs  Lab 12/27/17 1619 12/28/17 0920 12/29/17 0534 12/30/17 0511  NA 130* 131* 135 137  K 3.9 4.3 3.4* 4.0  CL 94* 102 103 107  CO2 23 15* 18* 13*  GLUCOSE 125* 127* 116* 124*  BUN 11 31* 22* 10  CREATININE 0.89 2.42* 1.41* 0.85  CALCIUM 8.8* 6.9* 7.3* 7.9*  MG  --   --  1.6* 2.2   GFR: Estimated Creatinine Clearance: 176.3 mL/min (by C-G formula based on SCr of 0.85 mg/dL). Liver Function Tests: Recent Labs  Lab 12/27/17 1619 12/29/17 0534 12/30/17 0511  AST 27 56* 64*  ALT 25 47* 62*  ALKPHOS 63 93 144*  BILITOT 4.1* 4.0* 3.2*  PROT 7.6 5.6* 6.0*  ALBUMIN 4.0 2.7* 2.7*   No results for input(s): LIPASE, AMYLASE in the last 168 hours. No results for input(s): AMMONIA in the last 168 hours. Coagulation Profile: Recent Labs  Lab 12/27/17 1619  INR 1.10   Cardiac Enzymes: No results for input(s): CKTOTAL, CKMB, CKMBINDEX, TROPONINI in the last  168 hours. BNP (last 3 results) No results for input(s): PROBNP in the last 8760 hours. HbA1C: No results for input(s): HGBA1C in the last 72 hours. CBG: No results for input(s): GLUCAP in the last 168 hours. Lipid Profile: No results for input(s): CHOL, HDL, LDLCALC, TRIG, CHOLHDL, LDLDIRECT in the last 72 hours. Thyroid Function Tests: No results for input(s): TSH, T4TOTAL, FREET4, T3FREE, THYROIDAB in the last 72 hours. Anemia Panel: No results for input(s): VITAMINB12, FOLATE, FERRITIN, TIBC, IRON, RETICCTPCT in the last 72 hours. Sepsis Labs: Recent Labs  Lab 12/27/17 1630  LATICACIDVEN 1.18    Recent Results (from the past 240 hour(s))  Blood culture (routine x 2)     Status:  Abnormal   Collection Time: 12/27/17  4:19 PM  Result Value Ref Range Status   Specimen Description   Final    BLOOD LEFT ANTECUBITAL Performed at [facility], [address] [place], [place] [address]    Special Requests   Final    BOTTLES DRAWN AEROBIC AND ANAEROBIC Blood Culture results may not be optimal due to an excessive volume of blood received in culture bottles Performed at [facility], [address], [place], [place] [address]    Culture  Setup Time   Final    GRAM POSITIVE COCCI IN CLUSTERS IN BOTH AEROBIC AND ANAEROBIC BOTTLES CRITICAL RESULT CALLED TO, READ BACK BY AND VERIFIED WITH: D. Wofford PharmD 10:25 12/28/17 ([email or web address]) Performed at [facility], [address], [place], [place] [address]    Culture STAPHYLOCOCCUS AUREUS (A)  Final   Report Status 12/30/2017 FINAL  Final   Organism ID, Bacteria STAPHYLOCOCCUS AUREUS  Final      Susceptibility   Staphylococcus aureus - MIC*    CIPROFLOXACIN <=0.5 SENSITIVE Sensitive     ERYTHROMYCIN <=0.25 SENSITIVE Sensitive     GENTAMICIN <=0.5 SENSITIVE Sensitive     OXACILLIN 0.5 SENSITIVE Sensitive     TETRACYCLINE <=1 SENSITIVE Sensitive     VANCOMYCIN <=0.5 SENSITIVE Sensitive     TRIMETH/SULFA <=10 SENSITIVE Sensitive     CLINDAMYCIN <=0.25 SENSITIVE Sensitive     RIFAMPIN <=0.5 SENSITIVE Sensitive     Inducible Clindamycin NEGATIVE Sensitive     * STAPHYLOCOCCUS AUREUS  Blood Culture ID Panel (Reflexed)     Status: Abnormal   Collection Time: 12/27/17  4:19 PM  Result Value Ref Range Status   Enterococcus species NOT DETECTED NOT DETECTED Final   Listeria monocytogenes NOT DETECTED NOT DETECTED Final   Staphylococcus species DETECTED (A) NOT DETECTED Final    Comment: CRITICAL RESULT CALLED TO, READ BACK BY AND VERIFIED WITH: D. Wofford PharmD 10:25 12/28/17 ([email or web address])    Staphylococcus aureus (BCID) DETECTED (A) NOT DETECTED Final    Comment: Methicillin (oxacillin)  susceptible Staphylococcus aureus (MSSA). Preferred therapy is anti staphylococcal beta lactam antibiotic (Cefazolin or Nafcillin), unless clinically contraindicated. CRITICAL RESULT CALLED TO, READ BACK BY AND VERIFIED WITH: D. Wofford PharmD 10:25 12/28/17 ([email or web address])    Methicillin resistance NOT DETECTED NOT DETECTED Final   Streptococcus species NOT DETECTED NOT DETECTED Final   Streptococcus agalactiae NOT DETECTED NOT DETECTED Final   Streptococcus pneumoniae NOT DETECTED NOT DETECTED Final   Streptococcus pyogenes NOT DETECTED NOT DETECTED Final   Acinetobacter baumannii NOT DETECTED NOT DETECTED Final   Enterobacteriaceae species NOT DETECTED NOT DETECTED Final   Enterobacter cloacae complex NOT DETECTED NOT DETECTED Final   Escherichia coli NOT DETECTED NOT DETECTED Final   Klebsiella oxytoca NOT  DETECTED NOT DETECTED Final   Klebsiella pneumoniae NOT DETECTED NOT DETECTED Final   Proteus species NOT DETECTED NOT DETECTED Final   Serratia marcescens NOT DETECTED NOT DETECTED Final   Haemophilus influenzae NOT DETECTED NOT DETECTED Final   Neisseria meningitidis NOT DETECTED NOT DETECTED Final   Pseudomonas aeruginosa NOT DETECTED NOT DETECTED Final   Candida albicans NOT DETECTED NOT DETECTED Final   Candida glabrata NOT DETECTED NOT DETECTED Final   Candida krusei NOT DETECTED NOT DETECTED Final   Candida parapsilosis NOT DETECTED NOT DETECTED Final   Candida tropicalis NOT DETECTED NOT DETECTED Final    Comment: Performed at [facility], [address], [place], [place] [address]  Blood culture (routine x 2)     Status: Abnormal   Collection Time: 12/27/17  4:35 PM  Result Value Ref Range Status   Specimen Description   Final    BLOOD LEFT HAND Performed at [facility], [address], [place], [place] [address]    Special Requests   Final    BOTTLES DRAWN AEROBIC ONLY Blood Culture results may not be optimal due to an inadequate volume of blood  received in culture bottles Performed at [facility], [address], [place], [place] [address]    Culture  Setup Time   Final    GRAM POSITIVE COCCI AEROBIC BOTTLE ONLY CRITICAL VALUE NOTED.  VALUE IS CONSISTENT WITH PREVIOUSLY REPORTED AND CALLED VALUE.    Culture (A)  Final    STAPHYLOCOCCUS AUREUS SUSCEPTIBILITIES PERFORMED ON PREVIOUS CULTURE WITHIN THE LAST 5 DAYS. Performed at [facility], [address] [place], [place] [address]    Report Status 12/30/2017 FINAL  Final  Aerobic/Anaerobic Culture (surgical/deep wound)     Status: None (Preliminary result)   Collection Time: 12/28/17  5:15 AM  Result Value Ref Range Status   Specimen Description   Final    WOUND RIGHT SHOULDER Performed at [facility], [address] [place], [place] [address]    Special Requests   Final    NONE Performed at [facility], [address], [place], [place] [address]    Gram Stain   Final    RARE WBC PRESENT, PREDOMINANTLY PMN RARE GRAM POSITIVE COCCI IN PAIRS Performed at [facility], [address] [place], [place] [address]    Culture   Final    FEW STAPHYLOCOCCUS AUREUS NO ANAEROBES ISOLATED; CULTURE IN PROGRESS FOR 5 DAYS    Report Status PENDING  Incomplete   Organism ID, Bacteria STAPHYLOCOCCUS AUREUS  Final      Susceptibility   Staphylococcus aureus - MIC*    CIPROFLOXACIN <=0.5 SENSITIVE Sensitive     ERYTHROMYCIN <=0.25 SENSITIVE Sensitive     GENTAMICIN <=0.5 SENSITIVE Sensitive     OXACILLIN 0.5 SENSITIVE Sensitive     TETRACYCLINE <=1 SENSITIVE Sensitive     VANCOMYCIN 1 SENSITIVE Sensitive     TRIMETH/SULFA <=10 SENSITIVE Sensitive     CLINDAMYCIN <=0.25 SENSITIVE Sensitive     RIFAMPIN <=0.5 SENSITIVE Sensitive     Inducible Clindamycin NEGATIVE Sensitive     * FEW STAPHYLOCOCCUS AUREUS  Aerobic/Anaerobic Culture (surgical/deep wound)     Status: None (Preliminary result)   Collection Time: 12/28/17   5:33 AM  Result Value Ref Range Status   Specimen Description   Final    TISSUE RIGHT SHOULDER Performed at [facility], [address], [place], [place] [address]  Special Requests   Final    NONE Performed at [facility], [address] [place], [place] [address]    Gram Stain   Final    RARE WBC PRESENT, PREDOMINANTLY PMN RARE GRAM POSITIVE COCCI IN PAIRS RARE GRAM NEGATIVE RODS Performed at [facility], [address] [place], [place] [address]    Culture   Final    FEW STAPHYLOCOCCUS AUREUS NO ANAEROBES ISOLATED; CULTURE IN PROGRESS FOR 5 DAYS    Report Status PENDING  Incomplete   Organism ID, Bacteria STAPHYLOCOCCUS AUREUS  Final      Susceptibility   Staphylococcus aureus - MIC*    CIPROFLOXACIN <=0.5 SENSITIVE Sensitive     ERYTHROMYCIN <=0.25 SENSITIVE Sensitive     GENTAMICIN <=0.5 SENSITIVE Sensitive     OXACILLIN 0.5 SENSITIVE Sensitive     TETRACYCLINE <=1 SENSITIVE Sensitive     VANCOMYCIN 1 SENSITIVE Sensitive     TRIMETH/SULFA <=10 SENSITIVE Sensitive     CLINDAMYCIN <=0.25 SENSITIVE Sensitive     RIFAMPIN <=0.5 SENSITIVE Sensitive     Inducible Clindamycin NEGATIVE Sensitive     * FEW STAPHYLOCOCCUS AUREUS  Culture, blood (routine x 2)     Status: None (Preliminary result)   Collection Time: 12/29/17  5:34 AM  Result Value Ref Range Status   Specimen Description   Final    BLOOD LEFT HAND Performed at [facility], [address] [place], [place] [address]    Special Requests   Final    BOTTLES DRAWN AEROBIC AND ANAEROBIC Blood Culture adequate volume Performed at [facility], [address] [place], [place] [address]    Culture   Final    NO GROWTH 2 DAYS Performed at [facility], [address] [place], [place] [address]    Report Status PENDING  Incomplete  Culture, blood (routine x 2)     Status: None (Preliminary result)   Collection Time: 12/29/17  5:34 AM   Result Value Ref Range Status   Specimen Description   Final    BLOOD LEFT HAND Performed at [facility], [address] [place], [place] [address]    Special Requests   Final    BOTTLES DRAWN AEROBIC AND ANAEROBIC Blood Culture adequate volume Performed at [facility], [address] [place], [place] [address]    Culture   Final    NO GROWTH 2 DAYS Performed at [facility], [address] [place], [place] [address]    Report Status PENDING  Incomplete         Radiology Studies: No results found.      Scheduled Meds: . buPROPion  300 mg Oral Daily  . cloNIDine  0.1 mg Oral BH-qamhs   Followed by  . [START ON 01/02/2018] cloNIDine  0.1 mg Oral QAC breakfast  . docusate sodium  100 mg Oral BID  . enoxaparin (LOVENOX) injection  40 mg Subcutaneous QHS  . mirtazapine  15 mg Oral QHS  . sodium chloride flush  3 mL Intravenous Q12H   Continuous Infusions: . sodium chloride 100 mL/hr at 12/31/17 0600  .  ceFAZolin (ANCEF) IV 2 g (12/31/17 0919)  . methocarbamol (ROBAXIN) IV Stopped (12/28/17 1154)     LOS: 4 days   Time spent - 25 Minutes  Barnetta ChapelSylvester I Carmelle Bamberg, MD [facility] Pager [phone] If 7PM-7AM, please contact night-coverage [email or web address] Password [email or web address] 12/31/2017, 12:14 PM

## 2017-12-31 NOTE — Progress Notes (Signed)
Subjective: 3 Days Post-Op Procedure(s) (LRB): ARTHROSCOPY SHOULDER  incision and drainage right shoulder and open incision and drainage shoulder joint (Right) Patient reports pain as moderate to right shoulder.  tolerating PO's. Denies CP or SOB.  Objective: Vital signs in last 24 hours: Temp:  [98.4 F (36.9 C)-99.2 F (37.3 C)] 98.4 F (36.9 C) (12/22 0427) Pulse Rate:  [84-92] 92 (12/22 0427) Resp:  [16-20] 20 (12/22 0427) BP: (100-162)/(70-89) 100/87 (12/22 0427) SpO2:  [99 %-100 %] 99 % (12/22 0427)  Intake/Output from previous day: 12/21 0701 - 12/22 0700 In: 2796.8 [P.O.:720; I.V.:1776.8; IV Piggyback:300] Out: 1000 [Urine:1000] Intake/Output this shift: No intake/output data recorded.  Recent Labs    12/28/17 0920 12/29/17 0534 12/30/17 0511  HGB 12.7* 12.0* 12.2*   Recent Labs    12/29/17 0534 12/30/17 0511  WBC 8.8 12.6*  RBC 4.58 4.74  HCT 37.6* 39.4  PLT 100* 165   Recent Labs    12/29/17 0534 12/30/17 0511  NA 135 137  K 3.4* 4.0  CL 103 107  CO2 18* 13*  BUN 22* 10  CREATININE 1.41* 0.85  GLUCOSE 116* 124*  CALCIUM 7.3* 7.9*   No results for input(s): LABPT, INR in the last 72 hours.  Well nourished. Alert and oriented x3. RRR, Lungs clear, BS x4. Abdomen soft and non tender. Right Calf soft and non tender. Right shoulder dressing C/D/I. Good elbow and wrist ROM. Compartment soft. No signs of infection.  Right UE grossly neurovascular intact.    Assessment/Plan: 3 Days Post-Op Procedure(s) (LRB): ARTHROSCOPY SHOULDER  incision and drainage right shoulder and open incision and drainage shoulder joint (Right) On medicine service Dressing C/D/I Pain management VSS    Maleigha Colvard L 12/31/2017, 8:52 AM

## 2017-12-31 NOTE — Progress Notes (Signed)
Pt stable at time of rounding with Carey BullocksJennifer Taylor RN. Pt had no needs. No s/s of distress or pain.

## 2017-12-31 NOTE — Progress Notes (Signed)
Occupational Therapy Treatment Patient Details Name: Logan Hancock MRN: [id number] DOB: 02/21/1990 Today's Date: 12/31/2017    History of present illness s/p I & Hancock of R shoulder/AC joint, r/o sepsis.  H/O drug use, bipolar, MRSA      Follow Up Recommendations  Follow surgeon's recommendation for DC plan and follow-up therapies(?SNF)    Equipment Recommendations  (to be further assessed. Pt is 6'4")       Precautions / Restrictions Precautions Precautions: Shoulder Type of Shoulder Precautions: sling on except for bathing, dressing, exercises.  OK to move elbow to fingers; P/AROM of FF to 90, abd to 60 and ER to 30 Precaution Booklet Issued: Yes (comment) Restrictions Weight Bearing Restrictions: Yes RUE Weight Bearing: Non weight bearing Other Position/Activity Restrictions: NWB RUE       Mobility Bed Mobility Overal bed mobility: Modified Independent                Transfers Overall transfer level: Needs assistance   Transfers: Sit to/from Stand;Stand Pivot Transfers Sit to Stand: Supervision Stand pivot transfers: Supervision            Balance Overall balance assessment: No apparent balance deficits (not formally assessed)                                         ADL either performed or assessed with clinical judgement   ADL Overall ADL's : Needs assistance/impaired                 Upper Body Dressing : Minimal assistance;Sitting Upper Body Dressing Details (indicate cue type and reason): sling     Toilet Transfer: Supervision/safety;Comfort height toilet;Ambulation   Toileting- Clothing Manipulation and Hygiene: Supervision/safety;Sit to/from stand                         Cognition Arousal/Alertness: Awake/alert Behavior During Therapy: WFL for tasks assessed/performed Overall Cognitive Status: No family/caregiver present to determine baseline cognitive functioning                                  General Comments: mostly wfls; needs reinforcement with precautions        Exercises Other Exercises Other Exercises: gentle prom for FF to 45, abd to 40, ER to 30. 10 reps eacch.  Pt performed PROM of elbow and AROM wrist and fingers   Shoulder Instructions       General Comments      Pertinent Vitals/ Pain       Pain Score: 3  Pain Location: r shoulder Pain Descriptors / Indicators: Sore;Aching Pain Intervention(s): Limited activity within patient's tolerance;Repositioned  Home Living                                   Additional Comments: pt states he has no where to go. Was staying with a friend      Prior Functioning/Environment Level of Independence: Independent            Frequency  Min 2X/week        Progress Toward Goals  OT Goals(current goals can now be found in the care plan section)        Plan Discharge plan remains appropriate  AM-PAC OT "6 Clicks" Daily Activity     Outcome Measure   Help from another person eating meals?: A Little Help from another person taking care of personal grooming?: A Little Help from another person toileting, which includes using toliet, bedpan, or urinal?: A Little Help from another person bathing (including washing, rinsing, drying)?: A Little Help from another person to put on and taking off regular upper body clothing?: A Little Help from another person to put on and taking off regular lower body clothing?: A Little 6 Click Score: 18    End of Session    OT Visit Diagnosis: Pain;Muscle weakness (generalized) (M62.81) Pain - Right/Left: Right Pain - part of body: Shoulder   Activity Tolerance Patient tolerated treatment well   Patient Left with call bell/phone within reach;in bed   Nurse Communication Mobility status        Time: [id number] OT Time Calculation (min): 26 min  Charges: OT General Charges $OT Visit: 1 Visit OT Treatments $Therapeutic Exercise: 23-37  mins  Logan AuerLori Sharian Hancock, OT Acute Rehabilitation Services [phone]- [phone] Office- [phone]      Logan Hancock, Logan Hancock 12/31/2017, 12:53 PM

## 2017-12-31 NOTE — Progress Notes (Signed)
Dr. Dartha Lodgegbata to see pt. Md stated we will continue to evaluate for need for TEE (look at the heart more closely). He also stated we will continue course of treatment with antibiotics.

## 2018-01-01 DIAGNOSIS — M009 Pyogenic arthritis, unspecified: Secondary | ICD-10-CM

## 2018-01-01 DIAGNOSIS — N179 Acute kidney failure, unspecified: Secondary | ICD-10-CM

## 2018-01-01 NOTE — Progress Notes (Signed)
Occupational Therapy Treatment Patient Details Name: Basilia Jumboyler K Houdeshell MRN: [id number] DOB: 07/21/1990 Today's Date: 01/01/2018    History of present illness s/p I & D of R shoulder/AC joint, r/o sepsis.  H/O drug use, bipolar, MRSA   OT comments  Pt with good participation with exercises this day.    Follow Up Recommendations  Follow surgeon's recommendation for DC plan and follow-up therapies(?SNF)    Equipment Recommendations  None recommended by OT(to be further assessed. Pt is 6'4")    Recommendations for Other Services      Precautions / Restrictions Precautions Precautions: Shoulder Type of Shoulder Precautions: sling on except for bathing, dressing, exercises.  OK to move elbow to fingers; P/AROM of FF to 90, abd to 60 and ER to 30 Precaution Booklet Issued: Yes (comment) Restrictions Weight Bearing Restrictions: Yes RUE Weight Bearing: Non weight bearing Other Position/Activity Restrictions: NWB RUE       Mobility Bed Mobility Overal bed mobility: Modified Independent                Transfers Overall transfer level: Needs assistance   Transfers: Sit to/from Stand;Stand Pivot Transfers Sit to Stand: Supervision Stand pivot transfers: Supervision            Balance Overall balance assessment: No apparent balance deficits (not formally assessed)                                         ADL either performed or assessed with clinical judgement     Cognition Arousal/Alertness: Awake/alert Behavior During Therapy: WFL for tasks assessed/performed Overall Cognitive Status: Within Functional Limits for tasks assessed                                          Exercises Other Exercises Other Exercises: gentle prom for FF to 60, abd to 40, ER to 30. 10 reps each.  Pt performed PROM of elbow and AROM wrist and fingers   Shoulder Instructions       General Comments      Pertinent Vitals/ Pain       Pain Score: 4   Pain Location: r shoulder Pain Descriptors / Indicators: Sore;Aching Pain Intervention(s): Limited activity within patient's tolerance;Repositioned;Monitored during session         Frequency  Min 2X/week        Progress Toward Goals  OT Goals(current goals can now be found in the care plan section)  Progress towards OT goals: Progressing toward goals     Plan Discharge plan remains appropriate    Co-evaluation                 AM-PAC OT "6 Clicks" Daily Activity     Outcome Measure   Help from another person eating meals?: A Little Help from another person taking care of personal grooming?: A Little Help from another person toileting, which includes using toliet, bedpan, or urinal?: A Little Help from another person bathing (including washing, rinsing, drying)?: A Little Help from another person to put on and taking off regular upper body clothing?: A Little Help from another person to put on and taking off regular lower body clothing?: A Little 6 Click Score: 18    End of Session    OT Visit Diagnosis: Pain;Muscle weakness (generalized) (M62.81)  Pain - Right/Left: Right Pain - part of body: Shoulder   Activity Tolerance Patient tolerated treatment well   Patient Left with call bell/phone within reach;in bed   Nurse Communication Mobility status        Time: 1015-1030 OT Time Calculation (min): 15 min  Charges: OT General Charges $OT Visit: 1 Visit OT Treatments $Therapeutic Exercise: 8-22 mins  Lise AuerLori Davonte Siebenaler, OT Acute Rehabilitation Services [phone] Office- [phone]      Miamarie Moll, [name] 01/01/2018, 12:34 PM

## 2018-01-01 NOTE — Plan of Care (Signed)
  Problem: Clinical Measurements: Goal: Ability to maintain clinical measurements within normal limits will improve Outcome: Progressing   Problem: Elimination: Goal: Will not experience complications related to bowel motility Outcome: Progressing   Problem: Safety: Goal: Ability to remain free from injury will improve Outcome: Progressing   

## 2018-01-01 NOTE — Progress Notes (Signed)
[facility]   Reason for visit: Follow up on septic arthritis and bacteremia  Interval History: repeat blood cultures ngtd, no new issues, no associated rash or diarrhea.  Continued shoulder pain but better.    Physical Exam: Constitutional:  Vitals:   01/01/18 0553 01/01/18 0846  BP: (!) 154/83 (!) 161/78  Pulse: 72 76  Resp: 18   Temp: 99 F (37.2 C)   SpO2: 97%    patient appears in NAD Eyes: anicteric HENT: no thrush Respiratory: Normal respiratory effort; CTA B Cardiovascular: RRR GI: soft, nt, nd  Review of Systems: Constitutional: negative for fevers and chills Gastrointestinal: negative for diarrhea Integument/breast: negative for rash  Lab Results  Component Value Date   WBC 12.6 (H) 12/30/2017   HGB 12.2 (L) 12/30/2017   HCT 39.4 12/30/2017   MCV 83.1 12/30/2017   PLT 165 12/30/2017    Lab Results  Component Value Date   CREATININE 0.85 12/30/2017   BUN 10 12/30/2017   NA 137 12/30/2017   K 4.0 12/30/2017   CL 107 12/30/2017   CO2 13 (L) 12/30/2017    Lab Results  Component Value Date   ALT 62 (H) 12/30/2017   AST 64 (H) 12/30/2017   ALKPHOS 144 (H) 12/30/2017     Microbiology: Recent Results (from the past 240 hour(s))  Blood culture (routine x 2)     Status: Abnormal   Collection Time: 12/27/17  4:19 PM  Result Value Ref Range Status   Specimen Description   Final    BLOOD LEFT ANTECUBITAL Performed at [facility], [address], [place], [place] [address]    Special Requests   Final    BOTTLES DRAWN AEROBIC AND ANAEROBIC Blood Culture results may not be optimal due to an excessive volume of blood received in culture bottles Performed at [facility], [address], [place], [place] [address]    Culture  Setup Time   Final    GRAM POSITIVE COCCI IN CLUSTERS IN BOTH AEROBIC AND ANAEROBIC BOTTLES CRITICAL RESULT CALLED TO, READ BACK BY AND VERIFIED WITH: D. Wofford  PharmD 10:25 12/28/17 ([email or web address]) Performed at [facility], [address] [place], [place] [address]    Culture STAPHYLOCOCCUS AUREUS (A)  Final   Report Status 12/30/2017 FINAL  Final   Organism ID, Bacteria STAPHYLOCOCCUS AUREUS  Final      Susceptibility   Staphylococcus aureus - MIC*    CIPROFLOXACIN <=0.5 SENSITIVE Sensitive     ERYTHROMYCIN <=0.25 SENSITIVE Sensitive     GENTAMICIN <=0.5 SENSITIVE Sensitive     OXACILLIN 0.5 SENSITIVE Sensitive     TETRACYCLINE <=1 SENSITIVE Sensitive     VANCOMYCIN <=0.5 SENSITIVE Sensitive     TRIMETH/SULFA <=10 SENSITIVE Sensitive     CLINDAMYCIN <=0.25 SENSITIVE Sensitive     RIFAMPIN <=0.5 SENSITIVE Sensitive     Inducible Clindamycin NEGATIVE Sensitive     * STAPHYLOCOCCUS AUREUS  Blood Culture ID Panel (Reflexed)     Status: Abnormal   Collection Time: 12/27/17  4:19 PM  Result Value Ref Range Status   Enterococcus species NOT DETECTED NOT DETECTED Final   Listeria monocytogenes NOT DETECTED NOT DETECTED Final   Staphylococcus species DETECTED (A) NOT DETECTED Final    Comment: CRITICAL RESULT CALLED TO, READ BACK BY AND VERIFIED WITH: D. Wofford PharmD 10:25 12/28/17 ([email or web address])    Staphylococcus aureus (BCID) DETECTED (A) NOT DETECTED Final    Comment: Methicillin (oxacillin)  susceptible Staphylococcus aureus (MSSA). Preferred therapy is anti staphylococcal beta lactam antibiotic (Cefazolin or Nafcillin), unless clinically contraindicated. CRITICAL RESULT CALLED TO, READ BACK BY AND VERIFIED WITH: D. Wofford PharmD 10:25 12/28/17 ([email or web address])    Methicillin resistance NOT DETECTED NOT DETECTED Final   Streptococcus species NOT DETECTED NOT DETECTED Final   Streptococcus agalactiae NOT DETECTED NOT DETECTED Final   Streptococcus pneumoniae NOT DETECTED NOT DETECTED Final   Streptococcus pyogenes NOT DETECTED NOT DETECTED Final   Acinetobacter baumannii NOT DETECTED NOT DETECTED Final   Enterobacteriaceae species NOT DETECTED NOT  DETECTED Final   Enterobacter cloacae complex NOT DETECTED NOT DETECTED Final   Escherichia coli NOT DETECTED NOT DETECTED Final   Klebsiella oxytoca NOT DETECTED NOT DETECTED Final   Klebsiella pneumoniae NOT DETECTED NOT DETECTED Final   Proteus species NOT DETECTED NOT DETECTED Final   Serratia marcescens NOT DETECTED NOT DETECTED Final   Haemophilus influenzae NOT DETECTED NOT DETECTED Final   Neisseria meningitidis NOT DETECTED NOT DETECTED Final   Pseudomonas aeruginosa NOT DETECTED NOT DETECTED Final   Candida albicans NOT DETECTED NOT DETECTED Final   Candida glabrata NOT DETECTED NOT DETECTED Final   Candida krusei NOT DETECTED NOT DETECTED Final   Candida parapsilosis NOT DETECTED NOT DETECTED Final   Candida tropicalis NOT DETECTED NOT DETECTED Final    Comment: Performed at [facility], [address] [place], [place] [address]  Blood culture (routine x 2)     Status: Abnormal   Collection Time: 12/27/17  4:35 PM  Result Value Ref Range Status   Specimen Description   Final    BLOOD LEFT HAND Performed at [facility], [address] [place], [place] [address]    Special Requests   Final    BOTTLES DRAWN AEROBIC ONLY Blood Culture results may not be optimal due to an inadequate volume of blood received in culture bottles Performed at [facility], [address] [place], [place] [address]    Culture  Setup Time   Final    GRAM POSITIVE COCCI AEROBIC BOTTLE ONLY CRITICAL VALUE NOTED.  VALUE IS CONSISTENT WITH PREVIOUSLY REPORTED AND CALLED VALUE.    Culture (A)  Final    STAPHYLOCOCCUS AUREUS SUSCEPTIBILITIES PERFORMED ON PREVIOUS CULTURE WITHIN THE LAST 5 DAYS. Performed at [facility], [address], [place], [place] [address]    Report Status 12/30/2017 FINAL  Final  Aerobic/Anaerobic Culture (surgical/deep wound)     Status: None (Preliminary result)   Collection Time: 12/28/17  5:15 AM  Result Value Ref  Range Status   Specimen Description   Final    WOUND RIGHT SHOULDER Performed at [facility], [address], [place], [place] [address]    Special Requests   Final    NONE Performed at [facility], [address] [place], [place] [address]    Gram Stain   Final    RARE WBC PRESENT, PREDOMINANTLY PMN RARE GRAM POSITIVE COCCI IN PAIRS Performed at [facility], [address], [place], [place] [address]    Culture   Final    FEW STAPHYLOCOCCUS AUREUS NO ANAEROBES ISOLATED; CULTURE IN PROGRESS FOR 5 DAYS    Report Status PENDING  Incomplete   Organism ID, Bacteria STAPHYLOCOCCUS AUREUS  Final      Susceptibility   Staphylococcus aureus - MIC*    CIPROFLOXACIN <=0.5 SENSITIVE Sensitive     ERYTHROMYCIN <=0.25 SENSITIVE Sensitive  GENTAMICIN <=0.5 SENSITIVE Sensitive     OXACILLIN 0.5 SENSITIVE Sensitive     TETRACYCLINE <=1 SENSITIVE Sensitive     VANCOMYCIN 1 SENSITIVE Sensitive     TRIMETH/SULFA <=10 SENSITIVE Sensitive     CLINDAMYCIN <=0.25 SENSITIVE Sensitive     RIFAMPIN <=0.5 SENSITIVE Sensitive     Inducible Clindamycin NEGATIVE Sensitive     * FEW STAPHYLOCOCCUS AUREUS  Aerobic/Anaerobic Culture (surgical/deep wound)     Status: None (Preliminary result)   Collection Time: 12/28/17  5:33 AM  Result Value Ref Range Status   Specimen Description   Final    TISSUE RIGHT SHOULDER Performed at [facility], [address] [place], [place] [address]    Special Requests   Final    NONE Performed at [facility], [address] [place], [place] [address]    Gram Stain   Final    RARE WBC PRESENT, PREDOMINANTLY PMN RARE GRAM POSITIVE COCCI IN PAIRS RARE GRAM NEGATIVE RODS Performed at [facility], [address] [place], [place] [address]    Culture   Final    FEW STAPHYLOCOCCUS AUREUS NO ANAEROBES ISOLATED; CULTURE IN PROGRESS FOR 5 DAYS    Report Status PENDING  Incomplete   Organism ID,  Bacteria STAPHYLOCOCCUS AUREUS  Final      Susceptibility   Staphylococcus aureus - MIC*    CIPROFLOXACIN <=0.5 SENSITIVE Sensitive     ERYTHROMYCIN <=0.25 SENSITIVE Sensitive     GENTAMICIN <=0.5 SENSITIVE Sensitive     OXACILLIN 0.5 SENSITIVE Sensitive     TETRACYCLINE <=1 SENSITIVE Sensitive     VANCOMYCIN 1 SENSITIVE Sensitive     TRIMETH/SULFA <=10 SENSITIVE Sensitive     CLINDAMYCIN <=0.25 SENSITIVE Sensitive     RIFAMPIN <=0.5 SENSITIVE Sensitive     Inducible Clindamycin NEGATIVE Sensitive     * FEW STAPHYLOCOCCUS AUREUS  Culture, blood (routine x 2)     Status: None (Preliminary result)   Collection Time: 12/29/17  5:34 AM  Result Value Ref Range Status   Specimen Description   Final    BLOOD LEFT HAND Performed at [facility], [address] [place], [place] [address]    Special Requests   Final    BOTTLES DRAWN AEROBIC AND ANAEROBIC Blood Culture adequate volume Performed at [facility], [address] [place], [place] [address]    Culture   Final    NO GROWTH 3 DAYS Performed at [facility], [address] [place], [place] [address]    Report Status PENDING  Incomplete  Culture, blood (routine x 2)     Status: None (Preliminary result)   Collection Time: 12/29/17  5:34 AM  Result Value Ref Range Status   Specimen Description   Final    BLOOD LEFT HAND Performed at [facility], [address] [place], [place] [address]    Special Requests   Final    BOTTLES DRAWN AEROBIC AND ANAEROBIC Blood Culture adequate volume Performed at [facility], [address] [place], [place] [address]    Culture   Final    NO GROWTH 3 DAYS Performed at [facility], [address] [place], [place] [address]    Report Status PENDING  Incomplete    Impression/Plan:  1. Bacteremia - MSSA and on cefazolin.  TTE negative for vegetation.  Needs non-urgent TEE to rule out IE.  He though  will need another  3 weeks through January 16th, longer if endocarditis found.  May consider linezolid after 2 more weeks if doing well.   2.  Septic arthritis - on cefazolin as above.  3. AKI - creat now wnl  I will continue to follow intermittently

## 2018-01-01 NOTE — Progress Notes (Signed)
Patient reported being at an 8/10 pain, location is his shoulder. Patient offered medication to help with the pain, patient refused. Cold offered in place of medication, patient refused.

## 2018-01-01 NOTE — Progress Notes (Signed)
   Subjective: 4 Days Post-Op Procedure(s) (LRB): ARTHROSCOPY SHOULDER  incision and drainage right shoulder and open incision and drainage shoulder joint (Right)  Recheck right shoulder s/p I&D Pt c/o mild soreness and stiffness but much improved since admission Denies any new symptoms or issues Patient reports pain as mild.  Objective:   VITALS:   Vitals:   01/01/18 0553 01/01/18 0846  BP: (!) 154/83 (!) 161/78  Pulse: 72 76  Resp: 18   Temp: 99 F (37.2 C)   SpO2: 97%     Right shoulder incisions healing well nv intact distally No erythema or drainage No rashes or edema  LABS Recent Labs    12/30/17 0511  HGB 12.2*  HCT 39.4  WBC 12.6*  PLT 165    Recent Labs    12/30/17 0511  NA 137  K 4.0  BUN 10  CREATININE 0.85  GLUCOSE 124*     Assessment/Plan: 4 Days Post-Op Procedure(s) (LRB): ARTHROSCOPY SHOULDER  incision and drainage right shoulder and open incision and drainage shoulder joint (Right) Overall pt steadily improving Stable from orthopedic standpoint Will sign off for now Continue OT as able F/u in 2 weeks once discharged  Recommend antibiotics as recommended by infectious disease     Elizebeth KollerBrad Gavina Dildine PA-C, [facility] now [facility]  [place] [address], [place], [place] [address] Phone: [phone] [email or web address] Facebook  [employer]  LinkedIn  Twitter

## 2018-01-01 NOTE — Progress Notes (Signed)
Patient ID: Logan Hancock, male   DOB: 08-Jan-1991, 27 y.o.   MRN: [id number]  PROGRESS NOTE    Logan Hancock  JYN:[id number] DOB: Dec 26, 1990 DOA: 12/27/2017 PCP: Patient, No Pcp Per   Brief Narrative:  27 year old male with history of polysubstance abuse including cocaine, IV heroin and marijuana; bipolar disorder presented with 5 days of right shoulder pain and swelling.  He was found to be febrile in the ED with elevated CRP.  Orthopedics was consulted for probable septic right shoulder.  MRI revealed signs concerning for septic arthritis.  Patient was started on antibiotics.  He was also found to have staph bacteremia, ID was consulted.  12/30/2017: Available records reviewed.  Patient seen.  No new complaints.  No documented fever or chills.  Infectious disease and orthopedic input appreciated.  No chest pain.  No nausea or vomiting.  12/31/2017: No complaints.  No fever or chills.  Continue IV antibiotics.  Infectious disease team.  Is appreciated.  Further management depend on hospital course.  No new complaints.  01/01/2018: Patient seen.  No new complaints.  Repeat blood cultures have not grown any organisms to date.  Infectious disease input is highly appreciated.   Assessment & Plan:   Active Problems:   Polysubstance abuse (HCC)   Heroin use disorder, severe (HCC)   Sepsis (HCC)   Septic arthritis of shoulder, right (HCC)   Hyponatremia   Infection of shoulder (HCC)  Sepsis secondary to septic joint -Antibiotics plan as below.  Decrease normal saline to 100 cc an hour.  Blood pressure improving.  Monitor 12/30/2017: IV antibiotics as per infectious disease team.  For possible TEE. 12/31/2017: Sepsis physiology has resolved. 01/01/2018: Complete course of antibiotics.  Defer timing of the TEE to the infectious disease team.  Right acromioclavicular joint septic arthritis -Confirmed by MRI. -Status post I&D on 12/28/2017.  Follow OR cultures; Gram stain showing gram-positive  cocci in pairs.   -Initially started on broad-spectrum antibiotics which were switched to IV Ancef by ID on 12/28/2017. 12/30/2017: Complete course of antibiotics.  Leukocytosis -Probably secondary to above.    Acute kidney injury  -Probably secondary to above.  Improving.  Repeat a.m. labs.  IV fluids plan as above. 12/30/2017: Resolved.  Continue to monitor renal function and electrolytes.   Staph bacteremia -BCID suggests MSSA bacteremia.  Follow cultures and sensitivities.  Follow blood cultures from today. -2D echo was negative for any vegetation. 12/30/2017: For TEE. 01/01/2018: Repeat blood cultures have not grown any organisms to date.  Will defer timing of the TEE to the infectious disease.  Polysubstance abuse  -Reports recent use of heroin and methamphetamines -Monitor for withdrawal.  Continue opiate withdrawal protocol -Social worker consult for substance abuse  Thrombocytopenia -Monitor.  No signs of bleeding. 12/30/2017: Resolved.  Hypokalemia -Replace.  Repeat a.m. labs 12/30/2017: Resolved.   Hypomagnesemia -Replace.  Repeat a.m. labs. 12/30/2017: Resolved  Hypo-natremia/hypochloremia -Improved.  Monitor 12/30/2017: Resolved  Bipolar disorder -Patient apparently is no longer on medications for bipolar disorder.  Outpatient follow-up  History of asthma -Albuterol as needed.   DVT prophylaxis: Lovenox Code Status: Full Family Communication: None at bedside Disposition Plan: Depends on clinical outcome  Consultants: Orthopedics.  ID  Procedures: I&D of right acromioclavicular joint Echo on 12/28/17 Study Conclusions  - Procedure narrative: Transthoracic echocardiography. Image   quality was suboptimal. The study was technically difficult, as a   result of restricted patient mobility. - Left ventricle: The cavity size was normal. Wall thickness was  normal. Systolic function was normal. The estimated ejection   fraction was in the range of  60% to 65%. Although no diagnostic   regional wall motion abnormality was identified, this possibility   cannot be completely excluded on the basis of this study. The   study is not technically sufficient to allow evaluation of LV   diastolic function. - Aortic valve: Transvalvular velocity was within the normal range.   There was no stenosis. There was no regurgitation. - Mitral valve: There was trivial regurgitation. - Left atrium: The atrium was normal in size. - Right ventricle: The cavity size was normal. Wall thickness was   normal. Systolic function was normal. - Right atrium: The atrium was normal in size. Central venous   pressure (est): 15 mm Hg. - Tricuspid valve: There was trivial regurgitation. - Pulmonic valve: There was trivial regurgitation. - Pulmonary arteries: Systolic pressure could not be accurately   estimated. - Inferior vena cava: The vessel was dilated. The respirophasic   diameter changes were blunted (< 50%). - Pericardium, extracardiac: There was no pericardial effusion.  Impressions:  - There was no evidence of a vegetation.  Recommendations:  Image quality suboptimal for detection of small vegetations. Consider TEE if clinically indicated.   Antimicrobials: Cefepime, Flagyl, vancomycin from 12/27/2017-12/28/17 Cefazolin from 12/28/2017 onwards   Subjective: Patient seen  No fever or chills.   No shortness of breath   Objective: Vitals:   12/31/17 2118 01/01/18 0201 01/01/18 0553 01/01/18 0846  BP: (!) 173/91 (!) 150/67 (!) 154/83 (!) 161/78  Pulse: 91 75 72   Resp: 16  18   Temp: 98.9 F (37.2 C)  99 F (37.2 C)   TempSrc: Oral  Oral   SpO2: 98%  97%   Weight:      Height:        Intake/Output Summary (Last 24 hours) at 01/01/2018 1059 Last data filed at 01/01/2018 0600 Gross per 24 hour  Intake 3667.35 ml  Output 0 ml  Net 3667.35 ml   Filed Weights   12/27/17 1407  Weight: 104.3 kg    Examination:  General exam:  Awake, alert, and in no obvious distress.  Respiratory system: Clear to auscultation.   Cardiovascular system: S1 & S2 heard, intermittent tachycardia Gastrointestinal system: Abdomen is nondistended, soft and nontender. Normal bowel sounds heard. Extremities: No cyanosis, clubbing, edema.  Right shoulder dressing present    Data Reviewed: I have personally reviewed following labs and imaging studies  CBC: Recent Labs  Lab 12/27/17 1619 12/28/17 0920 12/29/17 0534 12/30/17 0511  WBC 9.8 16.3* 8.8 12.6*  NEUTROABS 9.4*  --  7.3 9.8*  HGB 14.3 12.7* 12.0* 12.2*  HCT 44.9 39.8 37.6* 39.4  MCV 82.7 82.2 82.1 83.1  PLT 156 156 100* 165   Basic Metabolic Panel: Recent Labs  Lab 12/27/17 1619 12/28/17 0920 12/29/17 0534 12/30/17 0511  NA 130* 131* 135 137  K 3.9 4.3 3.4* 4.0  CL 94* 102 103 107  CO2 23 15* 18* 13*  GLUCOSE 125* 127* 116* 124*  BUN 11 31* 22* 10  CREATININE 0.89 2.42* 1.41* 0.85  CALCIUM 8.8* 6.9* 7.3* 7.9*  MG  --   --  1.6* 2.2   GFR: Estimated Creatinine Clearance: 176.3 mL/min (by C-G formula based on SCr of 0.85 mg/dL). Liver Function Tests: Recent Labs  Lab 12/27/17 1619 12/29/17 0534 12/30/17 0511  AST 27 56* 64*  ALT 25 47* 62*  ALKPHOS 63 93 144*  BILITOT 4.1*  4.0* 3.2*  PROT 7.6 5.6* 6.0*  ALBUMIN 4.0 2.7* 2.7*   No results for input(s): LIPASE, AMYLASE in the last 168 hours. No results for input(s): AMMONIA in the last 168 hours. Coagulation Profile: Recent Labs  Lab 12/27/17 1619  INR 1.10   Cardiac Enzymes: No results for input(s): CKTOTAL, CKMB, CKMBINDEX, TROPONINI in the last 168 hours. BNP (last 3 results) No results for input(s): PROBNP in the last 8760 hours. HbA1C: No results for input(s): HGBA1C in the last 72 hours. CBG: No results for input(s): GLUCAP in the last 168 hours. Lipid Profile: No results for input(s): CHOL, HDL, LDLCALC, TRIG, CHOLHDL, LDLDIRECT in the last 72 hours. Thyroid Function Tests: No  results for input(s): TSH, T4TOTAL, FREET4, T3FREE, THYROIDAB in the last 72 hours. Anemia Panel: No results for input(s): VITAMINB12, FOLATE, FERRITIN, TIBC, IRON, RETICCTPCT in the last 72 hours. Sepsis Labs: Recent Labs  Lab 12/27/17 1630  LATICACIDVEN 1.18    Recent Results (from the past 240 hour(s))  Blood culture (routine x 2)     Status: Abnormal   Collection Time: 12/27/17  4:19 PM  Result Value Ref Range Status   Specimen Description   Final    BLOOD LEFT ANTECUBITAL Performed at [facility], [address], [place], [place] [address]    Special Requests   Final    BOTTLES DRAWN AEROBIC AND ANAEROBIC Blood Culture results may not be optimal due to an excessive volume of blood received in culture bottles Performed at [facility], [address] [place], [place] [address]    Culture  Setup Time   Final    GRAM POSITIVE COCCI IN CLUSTERS IN BOTH AEROBIC AND ANAEROBIC BOTTLES CRITICAL RESULT CALLED TO, READ BACK BY AND VERIFIED WITH: D. Wofford PharmD 10:25 12/28/17 ([email or web address]) Performed at [facility], [address] [place], [place] [address]    Culture STAPHYLOCOCCUS AUREUS (A)  Final   Report Status 12/30/2017 FINAL  Final   Organism ID, Bacteria STAPHYLOCOCCUS AUREUS  Final      Susceptibility   Staphylococcus aureus - MIC*    CIPROFLOXACIN <=0.5 SENSITIVE Sensitive     ERYTHROMYCIN <=0.25 SENSITIVE Sensitive     GENTAMICIN <=0.5 SENSITIVE Sensitive     OXACILLIN 0.5 SENSITIVE Sensitive     TETRACYCLINE <=1 SENSITIVE Sensitive     VANCOMYCIN <=0.5 SENSITIVE Sensitive     TRIMETH/SULFA <=10 SENSITIVE Sensitive     CLINDAMYCIN <=0.25 SENSITIVE Sensitive     RIFAMPIN <=0.5 SENSITIVE Sensitive     Inducible Clindamycin NEGATIVE Sensitive     * STAPHYLOCOCCUS AUREUS  Blood Culture ID Panel (Reflexed)     Status: Abnormal   Collection Time: 12/27/17  4:19 PM  Result Value Ref Range Status   Enterococcus species NOT  DETECTED NOT DETECTED Final   Listeria monocytogenes NOT DETECTED NOT DETECTED Final   Staphylococcus species DETECTED (A) NOT DETECTED Final    Comment: CRITICAL RESULT CALLED TO, READ BACK BY AND VERIFIED WITH: D. Wofford PharmD 10:25 12/28/17 ([email or web address])    Staphylococcus aureus (BCID) DETECTED (A) NOT DETECTED Final    Comment: Methicillin (oxacillin) susceptible Staphylococcus aureus (MSSA). Preferred therapy is anti staphylococcal beta lactam antibiotic (Cefazolin or Nafcillin), unless clinically contraindicated. CRITICAL RESULT CALLED TO, READ BACK BY AND VERIFIED WITH: D. Wofford PharmD 10:25 12/28/17 ([email or web address])    Methicillin resistance NOT DETECTED NOT DETECTED Final   Streptococcus species NOT DETECTED NOT DETECTED Final   Streptococcus agalactiae NOT DETECTED NOT DETECTED  Final   Streptococcus pneumoniae NOT DETECTED NOT DETECTED Final   Streptococcus pyogenes NOT DETECTED NOT DETECTED Final   Acinetobacter baumannii NOT DETECTED NOT DETECTED Final   Enterobacteriaceae species NOT DETECTED NOT DETECTED Final   Enterobacter cloacae complex NOT DETECTED NOT DETECTED Final   Escherichia coli NOT DETECTED NOT DETECTED Final   Klebsiella oxytoca NOT DETECTED NOT DETECTED Final   Klebsiella pneumoniae NOT DETECTED NOT DETECTED Final   Proteus species NOT DETECTED NOT DETECTED Final   Serratia marcescens NOT DETECTED NOT DETECTED Final   Haemophilus influenzae NOT DETECTED NOT DETECTED Final   Neisseria meningitidis NOT DETECTED NOT DETECTED Final   Pseudomonas aeruginosa NOT DETECTED NOT DETECTED Final   Candida albicans NOT DETECTED NOT DETECTED Final   Candida glabrata NOT DETECTED NOT DETECTED Final   Candida krusei NOT DETECTED NOT DETECTED Final   Candida parapsilosis NOT DETECTED NOT DETECTED Final   Candida tropicalis NOT DETECTED NOT DETECTED Final    Comment: Performed at [facility], [address] [place], [place] [address]  Blood culture (routine x 2)      Status: Abnormal   Collection Time: 12/27/17  4:35 PM  Result Value Ref Range Status   Specimen Description   Final    BLOOD LEFT HAND Performed at [facility], [address] [place], [place] [address]    Special Requests   Final    BOTTLES DRAWN AEROBIC ONLY Blood Culture results may not be optimal due to an inadequate volume of blood received in culture bottles Performed at [facility], [address] [place], [place] [address]    Culture  Setup Time   Final    GRAM POSITIVE COCCI AEROBIC BOTTLE ONLY CRITICAL VALUE NOTED.  VALUE IS CONSISTENT WITH PREVIOUSLY REPORTED AND CALLED VALUE.    Culture (A)  Final    STAPHYLOCOCCUS AUREUS SUSCEPTIBILITIES PERFORMED ON PREVIOUS CULTURE WITHIN THE LAST 5 DAYS. Performed at [facility], [address] [place], [place] [address]    Report Status 12/30/2017 FINAL  Final  Aerobic/Anaerobic Culture (surgical/deep wound)     Status: None (Preliminary result)   Collection Time: 12/28/17  5:15 AM  Result Value Ref Range Status   Specimen Description   Final    WOUND RIGHT SHOULDER Performed at [facility], [address] [place], [place] [address]    Special Requests   Final    NONE Performed at [facility], [address] [place], [place] [address]    Gram Stain   Final    RARE WBC PRESENT, PREDOMINANTLY PMN RARE GRAM POSITIVE COCCI IN PAIRS Performed at [facility], [address] [place], [place] [address]    Culture   Final    FEW STAPHYLOCOCCUS AUREUS NO ANAEROBES ISOLATED; CULTURE IN PROGRESS FOR 5 DAYS    Report Status PENDING  Incomplete   Organism ID, Bacteria STAPHYLOCOCCUS AUREUS  Final      Susceptibility   Staphylococcus aureus - MIC*    CIPROFLOXACIN <=0.5 SENSITIVE Sensitive     ERYTHROMYCIN <=0.25 SENSITIVE Sensitive     GENTAMICIN <=0.5 SENSITIVE Sensitive     OXACILLIN 0.5 SENSITIVE Sensitive     TETRACYCLINE <=1 SENSITIVE  Sensitive     VANCOMYCIN 1 SENSITIVE Sensitive     TRIMETH/SULFA <=10 SENSITIVE Sensitive     CLINDAMYCIN <=0.25 SENSITIVE Sensitive     RIFAMPIN <=0.5 SENSITIVE Sensitive     Inducible Clindamycin NEGATIVE Sensitive     *  FEW STAPHYLOCOCCUS AUREUS  Aerobic/Anaerobic Culture (surgical/deep wound)     Status: None (Preliminary result)   Collection Time: 12/28/17  5:33 AM  Result Value Ref Range Status   Specimen Description   Final    TISSUE RIGHT SHOULDER Performed at [facility], [address] [place], [place] [address]    Special Requests   Final    NONE Performed at [facility], [address] [place], [place] [address]    Gram Stain   Final    RARE WBC PRESENT, PREDOMINANTLY PMN RARE GRAM POSITIVE COCCI IN PAIRS RARE GRAM NEGATIVE RODS Performed at [facility], [address], [place], [place] [address]    Culture   Final    FEW STAPHYLOCOCCUS AUREUS NO ANAEROBES ISOLATED; CULTURE IN PROGRESS FOR 5 DAYS    Report Status PENDING  Incomplete   Organism ID, Bacteria STAPHYLOCOCCUS AUREUS  Final      Susceptibility   Staphylococcus aureus - MIC*    CIPROFLOXACIN <=0.5 SENSITIVE Sensitive     ERYTHROMYCIN <=0.25 SENSITIVE Sensitive     GENTAMICIN <=0.5 SENSITIVE Sensitive     OXACILLIN 0.5 SENSITIVE Sensitive     TETRACYCLINE <=1 SENSITIVE Sensitive     VANCOMYCIN 1 SENSITIVE Sensitive     TRIMETH/SULFA <=10 SENSITIVE Sensitive     CLINDAMYCIN <=0.25 SENSITIVE Sensitive     RIFAMPIN <=0.5 SENSITIVE Sensitive     Inducible Clindamycin NEGATIVE Sensitive     * FEW STAPHYLOCOCCUS AUREUS  Culture, blood (routine x 2)     Status: None (Preliminary result)   Collection Time: 12/29/17  5:34 AM  Result Value Ref Range Status   Specimen Description   Final    BLOOD LEFT HAND Performed at [facility], [address], [place], [place] [address]    Special Requests   Final    BOTTLES DRAWN AEROBIC AND ANAEROBIC Blood  Culture adequate volume Performed at [facility], [address] [place], [place] [address]    Culture   Final    NO GROWTH 2 DAYS Performed at [facility], [address], [place], [place] [address]    Report Status PENDING  Incomplete  Culture, blood (routine x 2)     Status: None (Preliminary result)   Collection Time: 12/29/17  5:34 AM  Result Value Ref Range Status   Specimen Description   Final    BLOOD LEFT HAND Performed at [facility], [address] [place], [place] [address]    Special Requests   Final    BOTTLES DRAWN AEROBIC AND ANAEROBIC Blood Culture adequate volume Performed at [facility], [address] [place], [place] [address]    Culture   Final    NO GROWTH 2 DAYS Performed at [facility], [address] [place], [place] [address]    Report Status PENDING  Incomplete         Radiology Studies: No results found.      Scheduled Meds: . buPROPion  300 mg Oral Daily  . [START ON 01/02/2018] cloNIDine  0.1 mg Oral QAC breakfast  . docusate sodium  100 mg Oral BID  . enoxaparin (LOVENOX) injection  40 mg Subcutaneous QHS  . mirtazapine  15 mg Oral QHS  . nicotine  21 mg Transdermal Daily  . sodium chloride flush  3 mL Intravenous Q12H   Continuous Infusions: . sodium chloride 100 mL/hr at 01/01/18 0600  .  ceFAZolin (ANCEF) IV 2 g (01/01/18 0852)  . methocarbamol (ROBAXIN) IV Stopped (12/28/17 1154)     LOS: 5 days   Time spent - 25 Minutes  Barnetta Chapel, MD [facility] Pager [phone] If 7PM-7AM, please contact night-coverage [email or web address] Password [facility] 01/01/2018, 10:59 AM

## 2018-01-02 LAB — RENAL FUNCTION PANEL
Albumin: 2.6 g/dL — ABNORMAL LOW (ref 3.5–5.0)
Anion gap: 12 (ref 5–15)
BUN: 10 mg/dL (ref 6–20)
CO2: 23 mmol/L (ref 22–32)
Calcium: 8 mg/dL — ABNORMAL LOW (ref 8.9–10.3)
Chloride: 106 mmol/L (ref 98–111)
Creatinine, Ser: 0.7 mg/dL (ref 0.61–1.24)
GFR calc Af Amer: >60 mL/min (ref >60)
GFR calc non Af Amer: >60 mL/min (ref >60)
Glucose, Bld: 97 mg/dL (ref 70–99)
Phosphorus: 3.8 mg/dL (ref 2.5–4.6)
Potassium: 3.7 mmol/L (ref 3.5–5.1)
Sodium: 141 mmol/L (ref 135–145)

## 2018-01-02 LAB — CBC WITH DIFFERENTIAL/PLATELET
Abs Immature Granulocytes: 1.52 10*3/uL — ABNORMAL HIGH (ref 0.00–0.07)
Basophils Absolute: 0.1 10*3/uL (ref 0.0–0.1)
Basophils Relative: 1 %
Eosinophils Absolute: 0.5 10*3/uL (ref 0.0–0.5)
Eosinophils Relative: 4 %
HCT: 36.7 % — ABNORMAL LOW (ref 39.0–52.0)
Hemoglobin: 11.5 g/dL — ABNORMAL LOW (ref 13.0–17.0)
Immature Granulocytes: 12 %
Lymphocytes Relative: 26 %
Lymphs Abs: 3.1 10*3/uL (ref 0.7–4.0)
MCH: 25.9 pg — ABNORMAL LOW (ref 26.0–34.0)
MCHC: 31.3 g/dL (ref 30.0–36.0)
MCV: 82.7 fL (ref 80.0–100.0)
Monocytes Absolute: 1.3 10*3/uL — ABNORMAL HIGH (ref 0.1–1.0)
Monocytes Relative: 11 %
Neutro Abs: 5.7 10*3/uL (ref 1.7–7.7)
Neutrophils Relative %: 46 %
Platelets: 217 10*3/uL (ref 150–400)
RBC: 4.44 MIL/uL (ref 4.22–5.81)
RDW: 16 % — ABNORMAL HIGH (ref 11.5–15.5)
WBC: 12.2 10*3/uL — ABNORMAL HIGH (ref 4.0–10.5)
nRBC: 0 % (ref 0.0–0.2)

## 2018-01-02 LAB — MAGNESIUM: Magnesium: 1.8 mg/dL (ref 1.7–2.4)

## 2018-01-02 LAB — AEROBIC/ANAEROBIC CULTURE W GRAM STAIN (SURGICAL/DEEP WOUND)

## 2018-01-02 LAB — AEROBIC/ANAEROBIC CULTURE (SURGICAL/DEEP WOUND)

## 2018-01-02 MED ORDER — HYDROMORPHONE HCL 1 MG/ML IJ SOLN
0.5000 mg | Freq: Once | INTRAMUSCULAR | Status: DC
  Filled 2018-01-02: qty 0.5

## 2018-01-02 NOTE — Progress Notes (Signed)
Occupational Therapy Treatment Patient Details Name: Logan Hancock MRN: [id number] DOB: 02/27/1990 Today's Date: 01/02/2018    History of present illness s/p I & D of R shoulder/AC joint, r/o sepsis.  H/O drug use, bipolar, MRSA   OT comments   Pt with increased I with exercises this day.     Follow Up Recommendations  Follow surgeon's recommendation for DC plan and follow-up therapies(?SNF)    Equipment Recommendations  None recommended by OT(to be further assessed. Pt is 6'4")    Recommendations for Other Services      Precautions / Restrictions Precautions Precautions: Shoulder Type of Shoulder Precautions: sling on except for bathing, dressing, exercises.  OK to move elbow to fingers; P/AROM of FF to 90, abd to 60 and ER to 30 Precaution Booklet Issued: Yes (comment) Restrictions Other Position/Activity Restrictions: NWB RUE       Mobility Bed Mobility Overal bed mobility: Modified Independent                Transfers Overall transfer level: Modified independent                    Balance Overall balance assessment: No apparent balance deficits (not formally assessed)                                         ADL either performed or assessed with clinical judgement                  Cognition Arousal/Alertness: Awake/alert Behavior During Therapy: WFL for tasks assessed/performed Overall Cognitive Status: Within Functional Limits for tasks assessed                                          Exercises Other Exercises Other Exercises: RUE gentle prom for FF to 60 in sitting- 10 reps/ 2 sets in standing Other Exercises:  RUE AAROM abduction to 40- 10 reps/ 2 sets in standing Other Exercises:  RUE AROM elbow flexion/extension 20 reps in standing Other Exercises: RUE AROM wrist flexion/extension 20 reps sitting Other Exercises: RUE AROM finger flexion/extension 20 reps sitting   Shoulder Instructions             Pertinent Vitals/ Pain       Pain Score: 2  Pain Location: r shoulder Pain Descriptors / Indicators: Sore Pain Intervention(s): Monitored during session         Frequency  Min 2X/week        Progress Toward Goals  OT Goals(current goals can now be found in the care plan section)  Progress towards OT goals: Progressing toward goals     Plan Discharge plan remains appropriate       AM-PAC OT "6 Clicks" Daily Activity     Outcome Measure   Help from another person eating meals?: A Little Help from another person taking care of personal grooming?: A Little Help from another person toileting, which includes using toliet, bedpan, or urinal?: A Little Help from another person bathing (including washing, rinsing, drying)?: A Little Help from another person to put on and taking off regular upper body clothing?: A Little Help from another person to put on and taking off regular lower body clothing?: A Little 6 Click Score: 18    End of Session  OT Visit Diagnosis: Pain;Muscle weakness (generalized) (M62.81) Pain - Right/Left: Right Pain - part of body: Shoulder   Activity Tolerance Patient tolerated treatment well   Patient Left with call bell/phone within reach;in bed   Nurse Communication Mobility status        Time: [id number] OT Time Calculation (min): 19 min  Charges: OT General Charges $OT Visit: 1 Visit OT Treatments $Therapeutic Exercise: 8-22 mins  Lise AuerLori Ellison Leisure, OT Acute Rehabilitation Services [phone] Office- [phone]      Gregoria Selvy, [name] 01/02/2018, 2:52 PM

## 2018-01-02 NOTE — Plan of Care (Signed)
Pt alert and oriented, resting this am. Complaints of pain, well controlled with PO pain meds.  Pt refuses sling. RN will monitor.

## 2018-01-02 NOTE — Progress Notes (Signed)
Patient ID: Logan Hancock, male   DOB: 02/14/1990, 27 y.o.   MRN: [id number]  PROGRESS NOTE    Logan Hancock  EXB:[id number] DOB: 1990/07/03 DOA: 12/27/2017 PCP: Patient, No Pcp Per   Brief Narrative:  27 year old male with history of polysubstance abuse including cocaine, IV heroin and marijuana; bipolar disorder presented with 5 days of right shoulder pain and swelling.  He was found to be febrile in the ED with elevated CRP.  Orthopedics was consulted for probable septic right shoulder.  MRI revealed signs concerning for septic arthritis.  Patient was started on antibiotics.  He was also found to have staph bacteremia, ID was consulted.  12/30/2017: Available records reviewed.  Patient seen.  No new complaints.  No documented fever or chills.  Infectious disease and orthopedic input appreciated.  No chest pain.  No nausea or vomiting.  12/31/2017: No complaints.  No fever or chills.  Continue IV antibiotics.  Infectious disease team.  Is appreciated.  Further management depend on hospital course.  No new complaints.  01/01/2018: Patient seen.  No new complaints.  Repeat blood cultures have not grown any organisms to date.  Infectious disease input is highly appreciated.  01/02/2018: No new complaints.  Complete course of antibiotics.  Possible TEE, but will defer to the infectious disease team.   Assessment & Plan:   Active Problems:   Polysubstance abuse (HCC)   Heroin use disorder, severe (HCC)   Sepsis (HCC)   Septic arthritis of shoulder, right (HCC)   Hyponatremia   Infection of shoulder (HCC)  Sepsis secondary to septic joint -Antibiotics plan as below.  Decrease normal saline to 100 cc an hour.  Blood pressure improving.  Monitor 12/30/2017: IV antibiotics as per infectious disease team.  For possible TEE. 12/31/2017: Sepsis physiology has resolved. 01/02/2018: Complete course of antibiotics.  Defer timing of the TEE to the infectious disease team.  Right acromioclavicular  joint septic arthritis -Confirmed by MRI. -Status post I&D on 12/28/2017.  Follow OR cultures; Gram stain showing gram-positive cocci in pairs.   -Initially started on broad-spectrum antibiotics which were switched to IV Ancef by ID on 12/28/2017. 12/30/2017: Complete course of antibiotics.  Leukocytosis -Probably secondary to above.    Acute kidney injury  -Probably secondary to above.  Improving.  Repeat a.m. labs.  IV fluids plan as above. 12/30/2017: Resolved.  Continue to monitor renal function and electrolytes.   Staph bacteremia -BCID suggests MSSA bacteremia.  Follow cultures and sensitivities.  Follow blood cultures from today. -2D echo was negative for any vegetation. 12/30/2017: For TEE. 01/01/2018: Repeat blood cultures have not grown any organisms to date.  Will defer timing of the TEE to the infectious disease.  Polysubstance abuse  -Reports recent use of heroin and methamphetamines -Monitor for withdrawal.  Continue opiate withdrawal protocol -Social worker consult for substance abuse  Thrombocytopenia -Monitor.  No signs of bleeding. 12/30/2017: Resolved.  Hypokalemia -Replace.  Repeat a.m. labs 12/30/2017: Resolved.   Hypomagnesemia -Replace.  Repeat a.m. labs. 12/30/2017: Resolved  Hypo-natremia/hypochloremia -Improved.  Monitor 12/30/2017: Resolved  Bipolar disorder -Patient apparently is no longer on medications for bipolar disorder.  Outpatient follow-up  History of asthma -Albuterol as needed.   DVT prophylaxis: Lovenox Code Status: Full Family Communication: None at bedside Disposition Plan: Depends on clinical outcome  Consultants: Orthopedics.  ID  Procedures: I&D of right acromioclavicular joint Echo on 12/28/17 Study Conclusions  - Procedure narrative: Transthoracic echocardiography. Image   quality was suboptimal. The study was technically  difficult, as a   result of restricted patient mobility. - Left ventricle: The cavity  size was normal. Wall thickness was   normal. Systolic function was normal. The estimated ejection   fraction was in the range of 60% to 65%. Although no diagnostic   regional wall motion abnormality was identified, this possibility   cannot be completely excluded on the basis of this study. The   study is not technically sufficient to allow evaluation of LV   diastolic function. - Aortic valve: Transvalvular velocity was within the normal range.   There was no stenosis. There was no regurgitation. - Mitral valve: There was trivial regurgitation. - Left atrium: The atrium was normal in size. - Right ventricle: The cavity size was normal. Wall thickness was   normal. Systolic function was normal. - Right atrium: The atrium was normal in size. Central venous   pressure (est): 15 mm Hg. - Tricuspid valve: There was trivial regurgitation. - Pulmonic valve: There was trivial regurgitation. - Pulmonary arteries: Systolic pressure could not be accurately   estimated. - Inferior vena cava: The vessel was dilated. The respirophasic   diameter changes were blunted (< 50%). - Pericardium, extracardiac: There was no pericardial effusion.  Impressions:  - There was no evidence of a vegetation.  Recommendations:  Image quality suboptimal for detection of small vegetations. Consider TEE if clinically indicated.   Antimicrobials: Cefepime, Flagyl, vancomycin from 12/27/2017-12/28/17 Cefazolin from 12/28/2017 onwards   Subjective: Patient seen  No fever or chills.   No shortness of breath   Objective: Vitals:   01/01/18 1404 01/01/18 2233 01/02/18 0621 01/02/18 1406  BP: (!) 162/85 (!) 162/72 (!) 172/76 (!) 143/68  Pulse: 74 90 78 77  Resp: 16 17 16 16   Temp: 98.5 F (36.9 C) 98.6 F (37 C) 98.2 F (36.8 C) 97.7 F (36.5 C)  TempSrc: Oral  Oral   SpO2: 98% 100% 97% 98%  Weight:      Height:        Intake/Output Summary (Last 24 hours) at 01/02/2018 1503 Last data filed at  01/02/2018 1454 Gross per 24 hour  Intake 2682.1 ml  Output -  Net 2682.1 ml   Filed Weights   12/27/17 1407  Weight: 104.3 kg    Examination:  General exam: Awake, alert, and in no obvious distress.  Respiratory system: Clear to auscultation.   Cardiovascular system: S1 & S2 heard, intermittent tachycardia Gastrointestinal system: Abdomen is nondistended, soft and nontender. Normal bowel sounds heard. Extremities: No cyanosis, clubbing, edema.  Right shoulder dressing present    Data Reviewed: I have personally reviewed following labs and imaging studies  CBC: Recent Labs  Lab 12/27/17 1619 12/28/17 0920 12/29/17 0534 12/30/17 0511 01/02/18 0509  WBC 9.8 16.3* 8.8 12.6* 12.2*  NEUTROABS 9.4*  --  7.3 9.8* 5.7  HGB 14.3 12.7* 12.0* 12.2* 11.5*  HCT 44.9 39.8 37.6* 39.4 36.7*  MCV 82.7 82.2 82.1 83.1 82.7  PLT 156 156 100* 165 217   Basic Metabolic Panel: Recent Labs  Lab 12/27/17 1619 12/28/17 0920 12/29/17 0534 12/30/17 0511 01/02/18 0509  NA 130* 131* 135 137 141  K 3.9 4.3 3.4* 4.0 3.7  CL 94* 102 103 107 106  CO2 23 15* 18* 13* 23  GLUCOSE 125* 127* 116* 124* 97  BUN 11 31* 22* 10 10  CREATININE 0.89 2.42* 1.41* 0.85 0.70  CALCIUM 8.8* 6.9* 7.3* 7.9* 8.0*  MG  --   --  1.6* 2.2 1.8  PHOS  --   --   --   --  3.8   GFR: Estimated Creatinine Clearance: 187.3 mL/min (by C-G formula based on SCr of 0.7 mg/dL). Liver Function Tests: Recent Labs  Lab 12/27/17 1619 12/29/17 0534 12/30/17 0511 01/02/18 0509  AST 27 56* 64*  --   ALT 25 47* 62*  --   ALKPHOS 63 93 144*  --   BILITOT 4.1* 4.0* 3.2*  --   PROT 7.6 5.6* 6.0*  --   ALBUMIN 4.0 2.7* 2.7* 2.6*   No results for input(s): LIPASE, AMYLASE in the last 168 hours. No results for input(s): AMMONIA in the last 168 hours. Coagulation Profile: Recent Labs  Lab 12/27/17 1619  INR 1.10   Cardiac Enzymes: No results for input(s): CKTOTAL, CKMB, CKMBINDEX, TROPONINI in the last 168 hours. BNP  (last 3 results) No results for input(s): PROBNP in the last 8760 hours. HbA1C: No results for input(s): HGBA1C in the last 72 hours. CBG: No results for input(s): GLUCAP in the last 168 hours. Lipid Profile: No results for input(s): CHOL, HDL, LDLCALC, TRIG, CHOLHDL, LDLDIRECT in the last 72 hours. Thyroid Function Tests: No results for input(s): TSH, T4TOTAL, FREET4, T3FREE, THYROIDAB in the last 72 hours. Anemia Panel: No results for input(s): VITAMINB12, FOLATE, FERRITIN, TIBC, IRON, RETICCTPCT in the last 72 hours. Sepsis Labs: Recent Labs  Lab 12/27/17 1630  LATICACIDVEN 1.18    Recent Results (from the past 240 hour(s))  Blood culture (routine x 2)     Status: Abnormal   Collection Time: 12/27/17  4:19 PM  Result Value Ref Range Status   Specimen Description   Final    BLOOD LEFT ANTECUBITAL Performed at [facility], [address] [place], [place] [address]    Special Requests   Final    BOTTLES DRAWN AEROBIC AND ANAEROBIC Blood Culture results may not be optimal due to an excessive volume of blood received in culture bottles Performed at [facility], [address] [place], [place] [address]    Culture  Setup Time   Final    GRAM POSITIVE COCCI IN CLUSTERS IN BOTH AEROBIC AND ANAEROBIC BOTTLES CRITICAL RESULT CALLED TO, READ BACK BY AND VERIFIED WITH: D. Wofford PharmD 10:25 12/28/17 ([email or web address]) Performed at [facility], [address] [place], [place] [address]    Culture STAPHYLOCOCCUS AUREUS (A)  Final   Report Status 12/30/2017 FINAL  Final   Organism ID, Bacteria STAPHYLOCOCCUS AUREUS  Final      Susceptibility   Staphylococcus aureus - MIC*    CIPROFLOXACIN <=0.5 SENSITIVE Sensitive     ERYTHROMYCIN <=0.25 SENSITIVE Sensitive     GENTAMICIN <=0.5 SENSITIVE Sensitive     OXACILLIN 0.5 SENSITIVE Sensitive     TETRACYCLINE <=1 SENSITIVE Sensitive     VANCOMYCIN <=0.5 SENSITIVE Sensitive     TRIMETH/SULFA <=10  SENSITIVE Sensitive     CLINDAMYCIN <=0.25 SENSITIVE Sensitive     RIFAMPIN <=0.5 SENSITIVE Sensitive     Inducible Clindamycin NEGATIVE Sensitive     * STAPHYLOCOCCUS AUREUS  Blood Culture ID Panel (Reflexed)     Status: Abnormal   Collection Time: 12/27/17  4:19 PM  Result Value Ref Range Status   Enterococcus species NOT DETECTED NOT DETECTED Final   Listeria monocytogenes NOT DETECTED NOT DETECTED Final   Staphylococcus species DETECTED (A) NOT DETECTED Final    Comment: CRITICAL RESULT CALLED TO, READ BACK BY AND VERIFIED WITH: D. Wofford PharmD 10:25  12/28/17 ([email or web address])    Staphylococcus aureus (BCID) DETECTED (A) NOT DETECTED Final    Comment: Methicillin (oxacillin) susceptible Staphylococcus aureus (MSSA). Preferred therapy is anti staphylococcal beta lactam antibiotic (Cefazolin or Nafcillin), unless clinically contraindicated. CRITICAL RESULT CALLED TO, READ BACK BY AND VERIFIED WITH: D. Wofford PharmD 10:25 12/28/17 ([email or web address])    Methicillin resistance NOT DETECTED NOT DETECTED Final   Streptococcus species NOT DETECTED NOT DETECTED Final   Streptococcus agalactiae NOT DETECTED NOT DETECTED Final   Streptococcus pneumoniae NOT DETECTED NOT DETECTED Final   Streptococcus pyogenes NOT DETECTED NOT DETECTED Final   Acinetobacter baumannii NOT DETECTED NOT DETECTED Final   Enterobacteriaceae species NOT DETECTED NOT DETECTED Final   Enterobacter cloacae complex NOT DETECTED NOT DETECTED Final   Escherichia coli NOT DETECTED NOT DETECTED Final   Klebsiella oxytoca NOT DETECTED NOT DETECTED Final   Klebsiella pneumoniae NOT DETECTED NOT DETECTED Final   Proteus species NOT DETECTED NOT DETECTED Final   Serratia marcescens NOT DETECTED NOT DETECTED Final   Haemophilus influenzae NOT DETECTED NOT DETECTED Final   Neisseria meningitidis NOT DETECTED NOT DETECTED Final   Pseudomonas aeruginosa NOT DETECTED NOT DETECTED Final   Candida albicans NOT DETECTED NOT DETECTED Final    Candida glabrata NOT DETECTED NOT DETECTED Final   Candida krusei NOT DETECTED NOT DETECTED Final   Candida parapsilosis NOT DETECTED NOT DETECTED Final   Candida tropicalis NOT DETECTED NOT DETECTED Final    Comment: Performed at [facility], [address], [place], [place] [address]  Blood culture (routine x 2)     Status: Abnormal   Collection Time: 12/27/17  4:35 PM  Result Value Ref Range Status   Specimen Description   Final    BLOOD LEFT HAND Performed at [facility], [address], [place], [place] [address]    Special Requests   Final    BOTTLES DRAWN AEROBIC ONLY Blood Culture results may not be optimal due to an inadequate volume of blood received in culture bottles Performed at [facility], [address] [place], [place] [address]    Culture  Setup Time   Final    GRAM POSITIVE COCCI AEROBIC BOTTLE ONLY CRITICAL VALUE NOTED.  VALUE IS CONSISTENT WITH PREVIOUSLY REPORTED AND CALLED VALUE.    Culture (A)  Final    STAPHYLOCOCCUS AUREUS SUSCEPTIBILITIES PERFORMED ON PREVIOUS CULTURE WITHIN THE LAST 5 DAYS. Performed at [facility], [address] [place], [place] [address]    Report Status 12/30/2017 FINAL  Final  Aerobic/Anaerobic Culture (surgical/deep wound)     Status: None   Collection Time: 12/28/17  5:15 AM  Result Value Ref Range Status   Specimen Description   Final    WOUND RIGHT SHOULDER Performed at [facility], [address], [place], [place] [address]    Special Requests   Final    NONE Performed at [facility], [address], [place], [place] [address]    Gram Stain   Final    RARE WBC PRESENT, PREDOMINANTLY PMN RARE GRAM POSITIVE COCCI IN PAIRS    Culture   Final    FEW STAPHYLOCOCCUS AUREUS NO ANAEROBES ISOLATED Performed at [facility], [address] [place], [place] [address]    Report Status 01/02/2018 FINAL  Final   Organism ID, Bacteria  STAPHYLOCOCCUS AUREUS  Final      Susceptibility   Staphylococcus aureus - MIC*    CIPROFLOXACIN <=0.5 SENSITIVE  Sensitive     ERYTHROMYCIN <=0.25 SENSITIVE Sensitive     GENTAMICIN <=0.5 SENSITIVE Sensitive     OXACILLIN 0.5 SENSITIVE Sensitive     TETRACYCLINE <=1 SENSITIVE Sensitive     VANCOMYCIN 1 SENSITIVE Sensitive     TRIMETH/SULFA <=10 SENSITIVE Sensitive     CLINDAMYCIN <=0.25 SENSITIVE Sensitive     RIFAMPIN <=0.5 SENSITIVE Sensitive     Inducible Clindamycin NEGATIVE Sensitive     * FEW STAPHYLOCOCCUS AUREUS  Aerobic/Anaerobic Culture (surgical/deep wound)     Status: None   Collection Time: 12/28/17  5:33 AM  Result Value Ref Range Status   Specimen Description   Final    TISSUE RIGHT SHOULDER Performed at [facility], [address], [place], [place] [address]    Special Requests   Final    NONE Performed at [facility], [address], [place], [place] [address]    Gram Stain   Final    RARE WBC PRESENT, PREDOMINANTLY PMN RARE GRAM POSITIVE COCCI IN PAIRS RARE GRAM NEGATIVE RODS    Culture   Final    FEW STAPHYLOCOCCUS AUREUS NO ANAEROBES ISOLATED Performed at [facility], [address] [place], [place] [address]    Report Status 01/02/2018 FINAL  Final   Organism ID, Bacteria STAPHYLOCOCCUS AUREUS  Final      Susceptibility   Staphylococcus aureus - MIC*    CIPROFLOXACIN <=0.5 SENSITIVE Sensitive     ERYTHROMYCIN <=0.25 SENSITIVE Sensitive     GENTAMICIN <=0.5 SENSITIVE Sensitive     OXACILLIN 0.5 SENSITIVE Sensitive     TETRACYCLINE <=1 SENSITIVE Sensitive     VANCOMYCIN 1 SENSITIVE Sensitive     TRIMETH/SULFA <=10 SENSITIVE Sensitive     CLINDAMYCIN <=0.25 SENSITIVE Sensitive     RIFAMPIN <=0.5 SENSITIVE Sensitive     Inducible Clindamycin NEGATIVE Sensitive     * FEW STAPHYLOCOCCUS AUREUS  Culture, blood (routine x 2)     Status: None (Preliminary result)   Collection Time: 12/29/17  5:34 AM  Result Value Ref  Range Status   Specimen Description   Final    BLOOD LEFT HAND Performed at [facility], [address], [place], [place] [address]    Special Requests   Final    BOTTLES DRAWN AEROBIC AND ANAEROBIC Blood Culture adequate volume Performed at [facility], [address], [place], [place] [address]    Culture   Final    NO GROWTH 4 DAYS Performed at [facility], [address], [place], [place] [address]    Report Status PENDING  Incomplete  Culture, blood (routine x 2)     Status: None (Preliminary result)   Collection Time: 12/29/17  5:34 AM  Result Value Ref Range Status   Specimen Description   Final    BLOOD LEFT HAND Performed at [facility], [address] [place], [place] [address]    Special Requests   Final    BOTTLES DRAWN AEROBIC AND ANAEROBIC Blood Culture adequate volume Performed at [facility], [address] [place], [place] [address]    Culture   Final    NO GROWTH 4 DAYS Performed at [facility], [address] [place], [place] [address]    Report Status PENDING  Incomplete         Radiology Studies: No results found.      Scheduled Meds: . buPROPion  300 mg Oral  Daily  . cloNIDine  0.1 mg Oral QAC breakfast  . docusate sodium  100 mg Oral BID  . enoxaparin (LOVENOX) injection  40 mg Subcutaneous QHS  . mirtazapine  15 mg Oral QHS  . nicotine  21 mg Transdermal Daily  . sodium chloride flush  3 mL Intravenous Q12H   Continuous Infusions: . sodium chloride 100 mL/hr at 01/02/18 0158  .  ceFAZolin (ANCEF) IV 2 g (01/02/18 0937)  . methocarbamol (ROBAXIN) IV Stopped (12/28/17 1154)     LOS: 6 days   Time spent - 25 Minutes  Barnetta ChapelSylvester I Herberth Deharo, MD [facility] Pager [phone] If 7PM-7AM, please contact night-coverage [email or web address] Password [email or web address] 01/02/2018, 3:03 PM

## 2018-01-03 LAB — CULTURE, BLOOD (ROUTINE X 2)
Culture: NO GROWTH
Culture: NO GROWTH
Special Requests: ADEQUATE
Special Requests: ADEQUATE

## 2018-01-03 NOTE — Plan of Care (Signed)
  Problem: Clinical Measurements: Goal: Ability to maintain clinical measurements within normal limits will improve Outcome: Progressing   Problem: Elimination: Goal: Will not experience complications related to bowel motility Outcome: Progressing   Problem: Safety: Goal: Ability to remain free from injury will improve Outcome: Progressing   Problem: Fluid Volume: Goal: Hemodynamic stability will improve Outcome: Progressing   Problem: Clinical Measurements: Goal: Diagnostic test results will improve Outcome: Progressing Goal: Signs and symptoms of infection will decrease Outcome: Progressing

## 2018-01-03 NOTE — Plan of Care (Signed)
  Problem: Clinical Measurements: Goal: Ability to maintain clinical measurements within normal limits will improve Outcome: Progressing   Problem: Elimination: Goal: Will not experience complications related to bowel motility Outcome: Progressing   Problem: Safety: Goal: Ability to remain free from injury will improve Outcome: Progressing   

## 2018-01-03 NOTE — Progress Notes (Signed)
Occupational Therapy Treatment Patient Details Name: Logan Hancock K Rake MRN: [id number] DOB: 10/22/1990 Today's Date: 01/03/2018    History of present illness s/p I & D of R shoulder/AC joint, r/o sepsis.  H/O drug use, bipolar, MRSA   OT comments  Pt much improved with FF this day of R shoulder.  Will see pt 1-2 more times and likely DC pt as he is becoming more I with his exercises.   Follow Up Recommendations  Follow surgeon's recommendation for DC plan and follow-up therapies   Equipment Recommendations  None recommended by OT(to be further assessed. Pt is 6'4")    Recommendations for Other Services      Precautions / Restrictions Precautions Precautions: Shoulder Type of Shoulder Precautions: sling on except for bathing, dressing, exercises.  OK to move elbow to fingers; P/AROM of FF to 90, abd to 60 and ER to 30 Precaution Booklet Issued: Yes (comment) Restrictions Other Position/Activity Restrictions: NWB RUE       Mobility Bed Mobility Overal bed mobility: Modified Independent                Transfers Overall transfer level: Modified independent                    Balance Overall balance assessment: No apparent balance deficits (not formally assessed)                                         ADL either performed or assessed with clinical judgement   ADL Overall ADL's : Modified independent         Upper Body Bathing: Modified independent;Sitting   Lower Body Bathing: Modified independent   Upper Body Dressing : Modified independent Upper Body Dressing Details (indicate cue type and reason): educated pt on how to don  Tshirt this day as sister brought him one this day.  Pt mod I.  Pt refused sling Lower Body Dressing: Sit to/from stand;Modified independent   Toilet Transfer: Modified Independent   Toileting- Clothing Manipulation and Hygiene: Modified independent;Sit to/from stand   Tub/ Engineer, structuralhower Transfer: Walk-in  shower;Modified independent   Functional mobility during ADLs: Modified independent       Vision Baseline Vision/History: No visual deficits            Cognition Arousal/Alertness: Awake/alert Behavior During Therapy: WFL for tasks assessed/performed Overall Cognitive Status: Within Functional Limits for tasks assessed                                          Exercises Other Exercises Other Exercises: RUE AAROM for FF to 90 in sitting- 10 reps/ 2 sets in standing using dowel  Other Exercises:  RUE AAROM abduction to 40- 10 reps/ 2 sets in standing Other Exercises:  RUE AROM elbow flexion/extension 20 reps in standing Other Exercises: RUE AROM wrist flexion/extension 20 reps sitting Other Exercises: RUE AROM finger flexion/extension 20 reps sitting   Shoulder Instructions       General Comments      Pertinent Vitals/ Pain       Pain Score: 2  Pain Location: r shoulder Pain Descriptors / Indicators: Sore Pain Intervention(s): Limited activity within patient's tolerance     Prior Functioning/Environment  Frequency  Min 2X/week        Progress Toward Goals  OT Goals(current goals can now be found in the care plan section)        Plan Discharge plan remains appropriate       AM-PAC OT "6 Clicks" Daily Activity     Outcome Measure   Help from another person eating meals?: None Help from another person taking care of personal grooming?: None Help from another person toileting, which includes using toliet, bedpan, or urinal?: None Help from another person bathing (including washing, rinsing, drying)?: None Help from another person to put on and taking off regular upper body clothing?: None Help from another person to put on and taking off regular lower body clothing?: None 6 Click Score: 24    End of Session    OT Visit Diagnosis: Pain;Muscle weakness (generalized) (M62.81) Pain - Right/Left: Right Pain - part of body:  Shoulder   Activity Tolerance Patient tolerated treatment well   Patient Left with call bell/phone within reach;in bed   Nurse Communication Mobility status        Time: [id number] OT Time Calculation (min): 14 min  Charges: OT General Charges $OT Visit: 1 Visit OT Treatments $Therapeutic Exercise: 8-22 mins  Lise AuerLori Raychel Dowler, OT Acute Rehabilitation Services [phone] Office- [phone]      Niquita Digioia, [name] 01/03/2018, 1:56 PM

## 2018-01-03 NOTE — Progress Notes (Signed)
Patient ID: Logan Hancock, male   DOB: 1990/01/16, 27 y.o.   MRN: [id number]  PROGRESS NOTE    Logan Hancock  GMW:[id number] DOB: 08-14-1990 DOA: 12/27/2017 PCP: Patient, No Pcp Per   Brief Narrative:  27 year old male with history of polysubstance abuse including cocaine, IV heroin and marijuana; bipolar disorder presented with 5 days of right shoulder pain and swelling.  He was found to be febrile in the ED with elevated CRP.  Orthopedics was consulted for probable septic right shoulder.  MRI revealed signs concerning for septic arthritis.  Patient was started on antibiotics.  He was also found to have staph bacteremia, ID was consulted.  12/30/2017: Available records reviewed.  Patient seen.  No new complaints.  No documented fever or chills.  Infectious disease and orthopedic input appreciated.  No chest pain.  No nausea or vomiting.  12/31/2017: No complaints.  No fever or chills.  Continue IV antibiotics.  Infectious disease team.  Is appreciated.  Further management depend on hospital course.  No new complaints.  01/01/2018: Patient seen.  No new complaints.  Repeat blood cultures have not grown any organisms to date.  Infectious disease input is highly appreciated.  01/03/2018: No new complaints.  Possible TEE, but will defer to the infectious disease team.   Assessment & Plan:   Active Problems:   Polysubstance abuse (HCC)   Heroin use disorder, severe (HCC)   Sepsis (HCC)   Septic arthritis of shoulder, right (HCC)   Hyponatremia   Infection of shoulder (HCC)  Sepsis secondary to septic joint -Antibiotics plan as below.  Decrease normal saline to 100 cc an hour.  Blood pressure improving.  Monitor 12/30/2017: IV antibiotics as per infectious disease team.  For possible TEE. 12/31/2017: Sepsis physiology has resolved. 01/03/2018: Complete course of antibiotics.  Defer timing of the TEE to the infectious disease team.  Right acromioclavicular joint septic  arthritis -Confirmed by MRI. -Status post I&D on 12/28/2017.  Follow OR cultures; Gram stain showing gram-positive cocci in pairs.   -Initially started on broad-spectrum antibiotics which were switched to IV Ancef by ID on 12/28/2017. 12/30/2017: Complete course of antibiotics.  Leukocytosis -Probably secondary to above.    Acute kidney injury  -Probably secondary to above.  Improving.  Repeat a.m. labs.  IV fluids plan as above. 12/30/2017: Resolved.  Continue to monitor renal function and electrolytes.   Staph bacteremia -BCID suggests MSSA bacteremia.  Follow cultures and sensitivities.  Follow blood cultures from today. -2D echo was negative for any vegetation. 12/30/2017: For TEE. 01/01/2018: Repeat blood cultures have not grown any organisms to date.  Will defer timing of the TEE to the infectious disease.  Polysubstance abuse  -Reports recent use of heroin and methamphetamines -No withdrawal.    -Social worker consult for substance abuse  Thrombocytopenia -Monitor.  No signs of bleeding. 12/30/2017: Resolved.  Hypokalemia -Replace.  Repeat a.m. labs 12/30/2017: Resolved.   Hypomagnesemia -Replace.  Repeat a.m. labs. 12/30/2017: Resolved  Hypo-natremia/hypochloremia -Improved.  Monitor 12/30/2017: Resolved  Bipolar disorder -Patient apparently is no longer on medications for bipolar disorder.  Outpatient follow-up  History of asthma -Albuterol as needed.   DVT prophylaxis: Lovenox Code Status: Full Family Communication: None at bedside Disposition Plan: Depends on clinical outcome  Consultants: Orthopedics.  ID  Procedures: I&D of right acromioclavicular joint Echo on 12/28/17 Study Conclusions  - Procedure narrative: Transthoracic echocardiography. Image   quality was suboptimal. The study was technically difficult, as a   result of restricted  patient mobility. - Left ventricle: The cavity size was normal. Wall thickness was   normal. Systolic  function was normal. The estimated ejection   fraction was in the range of 60% to 65%. Although no diagnostic   regional wall motion abnormality was identified, this possibility   cannot be completely excluded on the basis of this study. The   study is not technically sufficient to allow evaluation of LV   diastolic function. - Aortic valve: Transvalvular velocity was within the normal range.   There was no stenosis. There was no regurgitation. - Mitral valve: There was trivial regurgitation. - Left atrium: The atrium was normal in size. - Right ventricle: The cavity size was normal. Wall thickness was   normal. Systolic function was normal. - Right atrium: The atrium was normal in size. Central venous   pressure (est): 15 mm Hg. - Tricuspid valve: There was trivial regurgitation. - Pulmonic valve: There was trivial regurgitation. - Pulmonary arteries: Systolic pressure could not be accurately   estimated. - Inferior vena cava: The vessel was dilated. The respirophasic   diameter changes were blunted (< 50%). - Pericardium, extracardiac: There was no pericardial effusion.  Impressions:  - There was no evidence of a vegetation.  Recommendations:  Image quality suboptimal for detection of small vegetations. Consider TEE if clinically indicated.   Antimicrobials: Cefepime, Flagyl, vancomycin from 12/27/2017-12/28/17 Cefazolin from 12/28/2017 onwards   Subjective: Patient seen  No fever or chills.   No shortness of breath   Objective: Vitals:   01/02/18 1406 01/02/18 2124 01/03/18 0520 01/03/18 1422  BP: (!) 143/68 (!) 143/66 (!) 142/75 (!) 141/74  Pulse: 77 77 65 62  Resp: 16 16 16 16   Temp: 97.7 F (36.5 C) 98.6 F (37 C) 98.5 F (36.9 C) 98.3 F (36.8 C)  TempSrc:    Oral  SpO2: 98% 100% 99% 98%  Weight:      Height:        Intake/Output Summary (Last 24 hours) at 01/03/2018 1734 Last data filed at 01/03/2018 1500 Gross per 24 hour  Intake 3073.35 ml   Output -  Net 3073.35 ml   Filed Weights   12/27/17 1407  Weight: 104.3 kg    Examination:  General exam: Awake, alert, and in no obvious distress.  Respiratory system: Clear to auscultation.   Cardiovascular system: S1 & S2 heard, intermittent tachycardia Gastrointestinal system: Abdomen is nondistended, soft and nontender. Normal bowel sounds heard. Extremities: No cyanosis, clubbing, edema.  Right shoulder dressing present    Data Reviewed: I have personally reviewed following labs and imaging studies  CBC: Recent Labs  Lab 12/28/17 0920 12/29/17 0534 12/30/17 0511 01/02/18 0509  WBC 16.3* 8.8 12.6* 12.2*  NEUTROABS  --  7.3 9.8* 5.7  HGB 12.7* 12.0* 12.2* 11.5*  HCT 39.8 37.6* 39.4 36.7*  MCV 82.2 82.1 83.1 82.7  PLT 156 100* 165 217   Basic Metabolic Panel: Recent Labs  Lab 12/28/17 0920 12/29/17 0534 12/30/17 0511 01/02/18 0509  NA 131* 135 137 141  K 4.3 3.4* 4.0 3.7  CL 102 103 107 106  CO2 15* 18* 13* 23  GLUCOSE 127* 116* 124* 97  BUN 31* 22* 10 10  CREATININE 2.42* 1.41* 0.85 0.70  CALCIUM 6.9* 7.3* 7.9* 8.0*  MG  --  1.6* 2.2 1.8  PHOS  --   --   --  3.8   GFR: Estimated Creatinine Clearance: 187.3 mL/min (by C-G formula based on SCr of 0.7 mg/dL). Liver  Function Tests: Recent Labs  Lab 12/29/17 0534 12/30/17 0511 01/02/18 0509  AST 56* 64*  --   ALT 47* 62*  --   ALKPHOS 93 144*  --   BILITOT 4.0* 3.2*  --   PROT 5.6* 6.0*  --   ALBUMIN 2.7* 2.7* 2.6*   No results for input(s): LIPASE, AMYLASE in the last 168 hours. No results for input(s): AMMONIA in the last 168 hours. Coagulation Profile: No results for input(s): INR, PROTIME in the last 168 hours. Cardiac Enzymes: No results for input(s): CKTOTAL, CKMB, CKMBINDEX, TROPONINI in the last 168 hours. BNP (last 3 results) No results for input(s): PROBNP in the last 8760 hours. HbA1C: No results for input(s): HGBA1C in the last 72 hours. CBG: No results for input(s): GLUCAP in  the last 168 hours. Lipid Profile: No results for input(s): CHOL, HDL, LDLCALC, TRIG, CHOLHDL, LDLDIRECT in the last 72 hours. Thyroid Function Tests: No results for input(s): TSH, T4TOTAL, FREET4, T3FREE, THYROIDAB in the last 72 hours. Anemia Panel: No results for input(s): VITAMINB12, FOLATE, FERRITIN, TIBC, IRON, RETICCTPCT in the last 72 hours. Sepsis Labs: No results for input(s): PROCALCITON, LATICACIDVEN in the last 168 hours.  Recent Results (from the past 240 hour(s))  Blood culture (routine x 2)     Status: Abnormal   Collection Time: 12/27/17  4:19 PM  Result Value Ref Range Status   Specimen Description   Final    BLOOD LEFT ANTECUBITAL Performed at [facility], [address] [place], [place] [address]    Special Requests   Final    BOTTLES DRAWN AEROBIC AND ANAEROBIC Blood Culture results may not be optimal due to an excessive volume of blood received in culture bottles Performed at [facility], [address] [place], [place] [address]    Culture  Setup Time   Final    GRAM POSITIVE COCCI IN CLUSTERS IN BOTH AEROBIC AND ANAEROBIC BOTTLES CRITICAL RESULT CALLED TO, READ BACK BY AND VERIFIED WITH: D. Wofford PharmD 10:25 12/28/17 ([email or web address]) Performed at [facility], [address] [place], [place] [address]    Culture STAPHYLOCOCCUS AUREUS (A)  Final   Report Status 12/30/2017 FINAL  Final   Organism ID, Bacteria STAPHYLOCOCCUS AUREUS  Final      Susceptibility   Staphylococcus aureus - MIC*    CIPROFLOXACIN <=0.5 SENSITIVE Sensitive     ERYTHROMYCIN <=0.25 SENSITIVE Sensitive     GENTAMICIN <=0.5 SENSITIVE Sensitive     OXACILLIN 0.5 SENSITIVE Sensitive     TETRACYCLINE <=1 SENSITIVE Sensitive     VANCOMYCIN <=0.5 SENSITIVE Sensitive     TRIMETH/SULFA <=10 SENSITIVE Sensitive     CLINDAMYCIN <=0.25 SENSITIVE Sensitive     RIFAMPIN <=0.5 SENSITIVE Sensitive     Inducible Clindamycin NEGATIVE Sensitive     *  STAPHYLOCOCCUS AUREUS  Blood Culture ID Panel (Reflexed)     Status: Abnormal   Collection Time: 12/27/17  4:19 PM  Result Value Ref Range Status   Enterococcus species NOT DETECTED NOT DETECTED Final   Listeria monocytogenes NOT DETECTED NOT DETECTED Final   Staphylococcus species DETECTED (A) NOT DETECTED Final    Comment: CRITICAL RESULT CALLED TO, READ BACK BY AND VERIFIED WITH: D. Wofford PharmD 10:25 12/28/17 ([email or web address])    Staphylococcus aureus (BCID) DETECTED (A) NOT DETECTED Final    Comment: Methicillin (oxacillin) susceptible Staphylococcus aureus (MSSA). Preferred therapy is anti staphylococcal beta lactam antibiotic (Cefazolin or Nafcillin), unless clinically contraindicated. CRITICAL RESULT CALLED  TO, READ BACK BY AND VERIFIED WITH: D. Wofford PharmD 10:25 12/28/17 ([email or web address])    Methicillin resistance NOT DETECTED NOT DETECTED Final   Streptococcus species NOT DETECTED NOT DETECTED Final   Streptococcus agalactiae NOT DETECTED NOT DETECTED Final   Streptococcus pneumoniae NOT DETECTED NOT DETECTED Final   Streptococcus pyogenes NOT DETECTED NOT DETECTED Final   Acinetobacter baumannii NOT DETECTED NOT DETECTED Final   Enterobacteriaceae species NOT DETECTED NOT DETECTED Final   Enterobacter cloacae complex NOT DETECTED NOT DETECTED Final   Escherichia coli NOT DETECTED NOT DETECTED Final   Klebsiella oxytoca NOT DETECTED NOT DETECTED Final   Klebsiella pneumoniae NOT DETECTED NOT DETECTED Final   Proteus species NOT DETECTED NOT DETECTED Final   Serratia marcescens NOT DETECTED NOT DETECTED Final   Haemophilus influenzae NOT DETECTED NOT DETECTED Final   Neisseria meningitidis NOT DETECTED NOT DETECTED Final   Pseudomonas aeruginosa NOT DETECTED NOT DETECTED Final   Candida albicans NOT DETECTED NOT DETECTED Final   Candida glabrata NOT DETECTED NOT DETECTED Final   Candida krusei NOT DETECTED NOT DETECTED Final   Candida parapsilosis NOT DETECTED NOT DETECTED Final    Candida tropicalis NOT DETECTED NOT DETECTED Final    Comment: Performed at [facility], [address] [place], [place] [address]  Blood culture (routine x 2)     Status: Abnormal   Collection Time: 12/27/17  4:35 PM  Result Value Ref Range Status   Specimen Description   Final    BLOOD LEFT HAND Performed at [facility], [address] [place], [place] [address]    Special Requests   Final    BOTTLES DRAWN AEROBIC ONLY Blood Culture results may not be optimal due to an inadequate volume of blood received in culture bottles Performed at [facility], [address] [place], [place] [address]    Culture  Setup Time   Final    GRAM POSITIVE COCCI AEROBIC BOTTLE ONLY CRITICAL VALUE NOTED.  VALUE IS CONSISTENT WITH PREVIOUSLY REPORTED AND CALLED VALUE.    Culture (A)  Final    STAPHYLOCOCCUS AUREUS SUSCEPTIBILITIES PERFORMED ON PREVIOUS CULTURE WITHIN THE LAST 5 DAYS. Performed at [facility], [address] [place], [place] [address]    Report Status 12/30/2017 FINAL  Final  Aerobic/Anaerobic Culture (surgical/deep wound)     Status: None   Collection Time: 12/28/17  5:15 AM  Result Value Ref Range Status   Specimen Description   Final    WOUND RIGHT SHOULDER Performed at [facility], [address] [place], [place] [address]    Special Requests   Final    NONE Performed at [facility], [address] [place], [place] [address]    Gram Stain   Final    RARE WBC PRESENT, PREDOMINANTLY PMN RARE GRAM POSITIVE COCCI IN PAIRS    Culture   Final    FEW STAPHYLOCOCCUS AUREUS NO ANAEROBES ISOLATED Performed at [facility], [address] [place], [place] [address]    Report Status 01/02/2018 FINAL  Final   Organism ID, Bacteria STAPHYLOCOCCUS AUREUS  Final      Susceptibility   Staphylococcus aureus - MIC*    CIPROFLOXACIN <=0.5 SENSITIVE Sensitive     ERYTHROMYCIN <=0.25 SENSITIVE  Sensitive     GENTAMICIN <=0.5 SENSITIVE Sensitive     OXACILLIN 0.5 SENSITIVE Sensitive     TETRACYCLINE <=1 SENSITIVE Sensitive     VANCOMYCIN 1 SENSITIVE  Sensitive     TRIMETH/SULFA <=10 SENSITIVE Sensitive     CLINDAMYCIN <=0.25 SENSITIVE Sensitive     RIFAMPIN <=0.5 SENSITIVE Sensitive     Inducible Clindamycin NEGATIVE Sensitive     * FEW STAPHYLOCOCCUS AUREUS  Aerobic/Anaerobic Culture (surgical/deep wound)     Status: None   Collection Time: 12/28/17  5:33 AM  Result Value Ref Range Status   Specimen Description   Final    TISSUE RIGHT SHOULDER Performed at [facility], [address], [place], [place] [address]    Special Requests   Final    NONE Performed at [facility], [address] [place], [place] [address]    Gram Stain   Final    RARE WBC PRESENT, PREDOMINANTLY PMN RARE GRAM POSITIVE COCCI IN PAIRS RARE GRAM NEGATIVE RODS    Culture   Final    FEW STAPHYLOCOCCUS AUREUS NO ANAEROBES ISOLATED Performed at [facility], [address], [place], [place] [address]    Report Status 01/02/2018 FINAL  Final   Organism ID, Bacteria STAPHYLOCOCCUS AUREUS  Final      Susceptibility   Staphylococcus aureus - MIC*    CIPROFLOXACIN <=0.5 SENSITIVE Sensitive     ERYTHROMYCIN <=0.25 SENSITIVE Sensitive     GENTAMICIN <=0.5 SENSITIVE Sensitive     OXACILLIN 0.5 SENSITIVE Sensitive     TETRACYCLINE <=1 SENSITIVE Sensitive     VANCOMYCIN 1 SENSITIVE Sensitive     TRIMETH/SULFA <=10 SENSITIVE Sensitive     CLINDAMYCIN <=0.25 SENSITIVE Sensitive     RIFAMPIN <=0.5 SENSITIVE Sensitive     Inducible Clindamycin NEGATIVE Sensitive     * FEW STAPHYLOCOCCUS AUREUS  Culture, blood (routine x 2)     Status: None   Collection Time: 12/29/17  5:34 AM  Result Value Ref Range Status   Specimen Description BLOOD LEFT HAND  Final   Special Requests   Final    BOTTLES DRAWN AEROBIC AND ANAEROBIC Blood Culture adequate volume Performed at [facility], [address], [place], [place] [address]    Culture NO GROWTH 5 DAYS  Final   Report Status 01/03/2018 FINAL  Final  Culture, blood (routine x 2)     Status: None   Collection Time: 12/29/17  5:34 AM  Result Value Ref Range Status   Specimen Description BLOOD LEFT HAND  Final   Special Requests   Final    BOTTLES DRAWN AEROBIC AND ANAEROBIC Blood Culture adequate volume Performed at [facility], [address] [place], [place] [address]    Culture NO GROWTH 5 DAYS  Final   Report Status 01/03/2018 FINAL  Final         Radiology Studies: No results found.      Scheduled Meds: . buPROPion  300 mg Oral Daily  . docusate sodium  100 mg Oral BID  . enoxaparin (LOVENOX) injection  40 mg Subcutaneous QHS  .  HYDROmorphone (DILAUDID) injection  0.5 mg Intravenous Once  . mirtazapine  15 mg Oral QHS  . nicotine  21 mg Transdermal Daily  . sodium chloride flush  3 mL Intravenous Q12H   Continuous Infusions: . sodium chloride 100 mL/hr at 01/03/18 1538  .  ceFAZolin (ANCEF) IV Stopped (01/03/18 1129)  . methocarbamol (ROBAXIN) IV Stopped (12/28/17 1154)     LOS: 7 days   Time spent - 25 Minutes  Barnetta Chapel, MD [facility] Pager [phone] If 7PM-7AM, please contact  night-coverage [email or web address] Password [facility] 01/03/2018, 5:34 PM

## 2018-01-04 LAB — CBC WITH DIFFERENTIAL/PLATELET
Abs Immature Granulocytes: 2.4 10*3/uL — ABNORMAL HIGH (ref 0.00–0.07)
Basophils Absolute: 0.1 10*3/uL (ref 0.0–0.1)
Basophils Relative: 1 %
Eosinophils Absolute: 0.4 10*3/uL (ref 0.0–0.5)
Eosinophils Relative: 3 %
HCT: 40.3 % (ref 39.0–52.0)
Hemoglobin: 12.2 g/dL — ABNORMAL LOW (ref 13.0–17.0)
Immature Granulocytes: 18 %
Lymphocytes Relative: 22 %
Lymphs Abs: 2.9 10*3/uL (ref 0.7–4.0)
MCH: 25.8 pg — ABNORMAL LOW (ref 26.0–34.0)
MCHC: 30.3 g/dL (ref 30.0–36.0)
MCV: 85.2 fL (ref 80.0–100.0)
Monocytes Absolute: 1.2 10*3/uL — ABNORMAL HIGH (ref 0.1–1.0)
Monocytes Relative: 9 %
Neutro Abs: 6.3 10*3/uL (ref 1.7–7.7)
Neutrophils Relative %: 47 %
Platelets: 243 10*3/uL (ref 150–400)
RBC: 4.73 MIL/uL (ref 4.22–5.81)
RDW: 16.2 % — ABNORMAL HIGH (ref 11.5–15.5)
WBC: 13.2 10*3/uL — ABNORMAL HIGH (ref 4.0–10.5)
nRBC: 0 % (ref 0.0–0.2)

## 2018-01-04 LAB — RENAL FUNCTION PANEL
Albumin: 2.9 g/dL — ABNORMAL LOW (ref 3.5–5.0)
Anion gap: 11 (ref 5–15)
BUN: 10 mg/dL (ref 6–20)
CO2: 23 mmol/L (ref 22–32)
Calcium: 8.3 mg/dL — ABNORMAL LOW (ref 8.9–10.3)
Chloride: 107 mmol/L (ref 98–111)
Creatinine, Ser: 0.72 mg/dL (ref 0.61–1.24)
GFR calc Af Amer: >60 mL/min (ref >60)
GFR calc non Af Amer: >60 mL/min (ref >60)
Glucose, Bld: 113 mg/dL — ABNORMAL HIGH (ref 70–99)
Phosphorus: 4.2 mg/dL (ref 2.5–4.6)
Potassium: 3.8 mmol/L (ref 3.5–5.1)
Sodium: 141 mmol/L (ref 135–145)

## 2018-01-04 LAB — MAGNESIUM: Magnesium: 2 mg/dL (ref 1.7–2.4)

## 2018-01-04 NOTE — Progress Notes (Signed)
    [facility]   Reason for visit: Follow up on bacteremia  Interval History: repeat cultures ngtd.  Physical Exam: Constitutional:  Vitals:   01/03/18 2059 01/04/18 0446  BP: (!) 151/85 (!) 146/73  Pulse: 86 79  Resp: 16 16  Temp: 98.6 F (37 C) 98.2 F (36.8 C)  SpO2: 100% 100%   patient appears in NAD  Impression: TTE negative for vegetation.  No TEE yet.    Plan: 1.  Continue cefazolin 2.  TEE  Will follow up again after TEE to determine duration of IV therapy and transition to po  Dr. Drue SecondSnider on tomorrow

## 2018-01-04 NOTE — Progress Notes (Signed)
Occupational Therapy Treatment Patient Details Name: Logan Hancock MRN: [id number] DOB: 10-14-1990 Today's Date: 01/04/2018    History of present illness s/p I & D of R shoulder/AC joint, r/o sepsis.  H/O drug use, bipolar, MRSA   OT comments  Goals close to being met. Pt does well with AAROM but not with AROM.  Educated pt OT would check continue to work with him on AROM for RUE.    Follow Up Recommendations  Follow surgeon's recommendation for DC plan and follow-up therapies(?SNF)    Equipment Recommendations  None recommended by OT(to be further assessed. Pt is 6'4")    Recommendations for Other Services      Precautions / Restrictions Precautions Precautions: Shoulder Type of Shoulder Precautions: sling on except for bathing, dressing, exercises.  OK to move elbow to fingers; P/AROM of FF to 90, abd to 60 and ER to 30 Precaution Booklet Issued: Yes (comment) Restrictions Other Position/Activity Restrictions: NWB RUE       Mobility Bed Mobility Overal bed mobility: Modified Independent                Transfers Overall transfer level: Modified independent   Transfers: Sit to/from Stand;Stand Pivot Transfers                Balance Overall balance assessment: No apparent balance deficits (not formally assessed)                                         ADL either performed or assessed with clinical judgement     Cognition Arousal/Alertness: Awake/alert Behavior During Therapy: WFL for tasks assessed/performed Overall Cognitive Status: Within Functional Limits for tasks assessed                                          Exercises Other Exercises Other Exercises: RUE AAROM for FF to 90 in sitting- 10 reps/ 2 sets in standing using dowel  Other Exercises:  RUE AAROM abduction to 40- 10 reps/ 2 sets in standing Other Exercises:  RUE AROM elbow flexion/extension 20 reps in standing Other Exercises: RUE AROM wrist  flexion/extension 20 reps sitting Other Exercises: RUE AROM finger flexion/extension 20 reps sitting   Shoulder Instructions       General Comments      Pertinent Vitals/ Pain       Pain Score: 4  Pain Location: r shoulder Pain Descriptors / Indicators: Sore Pain Intervention(s): Repositioned         Frequency  Min 2X/week        Progress Toward Goals  OT Goals(current goals can now be found in the care plan section)        Plan Discharge plan remains appropriate       AM-PAC OT "6 Clicks" Daily Activity     Outcome Measure   Help from another person eating meals?: None Help from another person taking care of personal grooming?: None Help from another person toileting, which includes using toliet, bedpan, or urinal?: None Help from another person bathing (including washing, rinsing, drying)?: None Help from another person to put on and taking off regular upper body clothing?: None Help from another person to put on and taking off regular lower body clothing?: None 6 Click Score: 24    End of Session  OT Visit Diagnosis: Pain;Muscle weakness (generalized) (M62.81) Pain - Right/Left: Right Pain - part of body: Shoulder   Activity Tolerance Patient tolerated treatment well   Patient Left with call bell/phone within reach;in bed   Nurse Communication Mobility status        Time: 3979-5369 OT Time Calculation (min): 12 min  Charges: OT General Charges $OT Visit: 1 Visit OT Treatments $Therapeutic Exercise: 8-22 mins  Kari Baars, [place] [phone] Office- [phone]      Jarin Cornfield, Edwena Felty D 01/04/2018, 2:59 PM

## 2018-01-04 NOTE — Progress Notes (Signed)
Patient ID: Logan Hancock, male   DOB: 1990-10-15, 27 y.o.   MRN: [id number]  PROGRESS NOTE    SHRIHAAN Hancock  Logan Hancock:[id number] DOB: 02-Dec-1990 DOA: 12/27/2017 PCP: Patient, No Pcp Per   Brief Narrative:  27 year old male with history of polysubstance abuse including cocaine, IV heroin and marijuana; bipolar disorder presented with 5 days of right shoulder pain and swelling.  He was found to be febrile in the ED with elevated CRP.  Orthopedics was consulted for probable septic right shoulder.  MRI revealed signs concerning for septic arthritis.  Patient was started on antibiotics.  He was also found to have staph bacteremia, ID was consulted.  12/30/2017: Available records reviewed.  Patient seen.  No new complaints.  No documented fever or chills.  Infectious disease and orthopedic input appreciated.  No chest pain.  No nausea or vomiting.  12/31/2017: No complaints.  No fever or chills.  Continue IV antibiotics.  Infectious disease team.  Is appreciated.  Further management depend on hospital course.  No new complaints.  01/01/2018: Patient seen.  No new complaints.  Repeat blood cultures have not grown any organisms to date.  Infectious disease input is highly appreciated.  01/04/2018: No new complaints.  Complete course of antibiotics.   Assessment & Plan:   Active Problems:   Polysubstance abuse (HCC)   Heroin use disorder, severe (HCC)   Sepsis (HCC)   Septic arthritis of shoulder, right (HCC)   Hyponatremia   Infection of shoulder (HCC)  Sepsis secondary to septic joint -Antibiotics plan as below.  Decrease normal saline to 100 cc an hour.  Blood pressure improving.  Monitor 12/30/2017: IV antibiotics as per infectious disease team.  For possible TEE. 12/31/2017: Sepsis physiology has resolved. 01/04/2018: Complete course of antibiotics.  Defer timing of the TEE to the infectious disease team.  Right acromioclavicular joint septic arthritis -Confirmed by MRI. -Status post I&D  on 12/28/2017.  Follow OR cultures; Gram stain showing gram-positive cocci in pairs.   -Initially started on broad-spectrum antibiotics which were switched to IV Ancef by ID on 12/28/2017. 12/30/2017: Complete course of antibiotics.  Leukocytosis -Probably secondary to above.    Acute kidney injury  -Probably secondary to above.  Improving.  Repeat a.m. labs.  IV fluids plan as above. 12/30/2017: Resolved.  Continue to monitor renal function and electrolytes.   Staph bacteremia -BCID suggests MSSA bacteremia.  Follow cultures and sensitivities.  Follow blood cultures from today. -2D echo was negative for any vegetation. 12/30/2017: For TEE. 01/01/2018: Repeat blood cultures have not grown any organisms to date.  Will defer timing of the TEE to the infectious disease.  Polysubstance abuse  -Reports recent use of heroin and methamphetamines -No withdrawal.    -Social worker consult for substance abuse  Thrombocytopenia -Monitor.  No signs of bleeding. 12/30/2017: Resolved.  Hypokalemia -Replace.  Repeat a.m. labs 12/30/2017: Resolved.   Hypomagnesemia -Replace.  Repeat a.m. labs. 12/30/2017: Resolved  Hypo-natremia/hypochloremia -Improved.  Monitor 12/30/2017: Resolved  Bipolar disorder -Patient apparently is no longer on medications for bipolar disorder.  Outpatient follow-up  History of asthma -Albuterol as needed.   DVT prophylaxis: Lovenox Code Status: Full Family Communication: None at bedside Disposition Plan: Depends on clinical outcome  Consultants: Orthopedics.  ID  Procedures: I&D of right acromioclavicular joint Echo on 12/28/17 Study Conclusions  - Procedure narrative: Transthoracic echocardiography. Image   quality was suboptimal. The study was technically difficult, as a   result of restricted patient mobility. - Left ventricle: The  cavity size was normal. Wall thickness was   normal. Systolic function was normal. The estimated ejection    fraction was in the range of 60% to 65%. Although no diagnostic   regional wall motion abnormality was identified, this possibility   cannot be completely excluded on the basis of this study. The   study is not technically sufficient to allow evaluation of LV   diastolic function. - Aortic valve: Transvalvular velocity was within the normal range.   There was no stenosis. There was no regurgitation. - Mitral valve: There was trivial regurgitation. - Left atrium: The atrium was normal in size. - Right ventricle: The cavity size was normal. Wall thickness was   normal. Systolic function was normal. - Right atrium: The atrium was normal in size. Central venous   pressure (est): 15 mm Hg. - Tricuspid valve: There was trivial regurgitation. - Pulmonic valve: There was trivial regurgitation. - Pulmonary arteries: Systolic pressure could not be accurately   estimated. - Inferior vena cava: The vessel was dilated. The respirophasic   diameter changes were blunted (< 50%). - Pericardium, extracardiac: There was no pericardial effusion.  Impressions:  - There was no evidence of a vegetation.  Recommendations:  Image quality suboptimal for detection of small vegetations. Consider TEE if clinically indicated.   Antimicrobials: Cefepime, Flagyl, vancomycin from 12/27/2017-12/28/17 Cefazolin from 12/28/2017 onwards   Subjective: Patient seen  No fever or chills.   No shortness of breath   Objective: Vitals:   01/03/18 1422 01/03/18 2059 01/04/18 0446 01/04/18 1403  BP: (!) 141/74 (!) 151/85 (!) 146/73 (!) 162/89  Pulse: 62 86 79 76  Resp: 16 16 16 16   Temp: 98.3 F (36.8 C) 98.6 F (37 C) 98.2 F (36.8 C) 98.1 F (36.7 C)  TempSrc: Oral Oral Oral   SpO2: 98% 100% 100% 100%  Weight:      Height:        Intake/Output Summary (Last 24 hours) at 01/04/2018 1644 Last data filed at 01/04/2018 0154 Gross per 24 hour  Intake 1620 ml  Output -  Net 1620 ml   Filed Weights     12/27/17 1407  Weight: 104.3 kg    Examination:  General exam: Awake, alert, and in no obvious distress.  Respiratory system: Clear to auscultation.   Cardiovascular system: S1 & S2 heard. Gastrointestinal system: Abdomen is nondistended, soft and nontender. Normal bowel sounds heard. Extremities: No cyanosis, clubbing, edema.  Right shoulder dressing present    Data Reviewed: I have personally reviewed following labs and imaging studies  CBC: Recent Labs  Lab 12/29/17 0534 12/30/17 0511 01/02/18 0509 01/04/18 0449  WBC 8.8 12.6* 12.2* 13.2*  NEUTROABS 7.3 9.8* 5.7 6.3  HGB 12.0* 12.2* 11.5* 12.2*  HCT 37.6* 39.4 36.7* 40.3  MCV 82.1 83.1 82.7 85.2  PLT 100* 165 217 243   Basic Metabolic Panel: Recent Labs  Lab 12/29/17 0534 12/30/17 0511 01/02/18 0509 01/04/18 0449  NA 135 137 141 141  K 3.4* 4.0 3.7 3.8  CL 103 107 106 107  CO2 18* 13* 23 23  GLUCOSE 116* 124* 97 113*  BUN 22* 10 10 10   CREATININE 1.41* 0.85 0.70 0.72  CALCIUM 7.3* 7.9* 8.0* 8.3*  MG 1.6* 2.2 1.8 2.0  PHOS  --   --  3.8 4.2   GFR: Estimated Creatinine Clearance: 187.3 mL/min (by C-G formula based on SCr of 0.72 mg/dL). Liver Function Tests: Recent Labs  Lab 12/29/17 0534 12/30/17 0511 01/02/18 0509 01/04/18  0449  AST 56* 64*  --   --   ALT 47* 62*  --   --   ALKPHOS 93 144*  --   --   BILITOT 4.0* 3.2*  --   --   PROT 5.6* 6.0*  --   --   ALBUMIN 2.7* 2.7* 2.6* 2.9*   No results for input(s): LIPASE, AMYLASE in the last 168 hours. No results for input(s): AMMONIA in the last 168 hours. Coagulation Profile: No results for input(s): INR, PROTIME in the last 168 hours. Cardiac Enzymes: No results for input(s): CKTOTAL, CKMB, CKMBINDEX, TROPONINI in the last 168 hours. BNP (last 3 results) No results for input(s): PROBNP in the last 8760 hours. HbA1C: No results for input(s): HGBA1C in the last 72 hours. CBG: No results for input(s): GLUCAP in the last 168 hours. Lipid  Profile: No results for input(s): CHOL, HDL, LDLCALC, TRIG, CHOLHDL, LDLDIRECT in the last 72 hours. Thyroid Function Tests: No results for input(s): TSH, T4TOTAL, FREET4, T3FREE, THYROIDAB in the last 72 hours. Anemia Panel: No results for input(s): VITAMINB12, FOLATE, FERRITIN, TIBC, IRON, RETICCTPCT in the last 72 hours. Sepsis Labs: No results for input(s): PROCALCITON, LATICACIDVEN in the last 168 hours.  Recent Results (from the past 240 hour(s))  Blood culture (routine x 2)     Status: Abnormal   Collection Time: 12/27/17  4:19 PM  Result Value Ref Range Status   Specimen Description   Final    BLOOD LEFT ANTECUBITAL Performed at [facility], [address], [place], [place] [address]    Special Requests   Final    BOTTLES DRAWN AEROBIC AND ANAEROBIC Blood Culture results may not be optimal due to an excessive volume of blood received in culture bottles Performed at [facility], [address], [place], [place] [address]    Culture  Setup Time   Final    GRAM POSITIVE COCCI IN CLUSTERS IN BOTH AEROBIC AND ANAEROBIC BOTTLES CRITICAL RESULT CALLED TO, READ BACK BY AND VERIFIED WITH: D. Wofford PharmD 10:25 12/28/17 ([email or web address]) Performed at [facility], [address], [place], [place] [address]    Culture STAPHYLOCOCCUS AUREUS (A)  Final   Report Status 12/30/2017 FINAL  Final   Organism ID, Bacteria STAPHYLOCOCCUS AUREUS  Final      Susceptibility   Staphylococcus aureus - MIC*    CIPROFLOXACIN <=0.5 SENSITIVE Sensitive     ERYTHROMYCIN <=0.25 SENSITIVE Sensitive     GENTAMICIN <=0.5 SENSITIVE Sensitive     OXACILLIN 0.5 SENSITIVE Sensitive     TETRACYCLINE <=1 SENSITIVE Sensitive     VANCOMYCIN <=0.5 SENSITIVE Sensitive     TRIMETH/SULFA <=10 SENSITIVE Sensitive     CLINDAMYCIN <=0.25 SENSITIVE Sensitive     RIFAMPIN <=0.5 SENSITIVE Sensitive     Inducible Clindamycin NEGATIVE Sensitive     * STAPHYLOCOCCUS AUREUS  Blood  Culture ID Panel (Reflexed)     Status: Abnormal   Collection Time: 12/27/17  4:19 PM  Result Value Ref Range Status   Enterococcus species NOT DETECTED NOT DETECTED Final   Listeria monocytogenes NOT DETECTED NOT DETECTED Final   Staphylococcus species DETECTED (A) NOT DETECTED Final    Comment: CRITICAL RESULT CALLED TO, READ BACK BY AND VERIFIED WITH: D. Wofford PharmD 10:25 12/28/17 ([email or web address])    Staphylococcus aureus (BCID) DETECTED (A) NOT DETECTED Final    Comment: Methicillin (oxacillin) susceptible Staphylococcus aureus (MSSA). Preferred therapy is anti staphylococcal beta lactam antibiotic (Cefazolin or Nafcillin), unless  clinically contraindicated. CRITICAL RESULT CALLED TO, READ BACK BY AND VERIFIED WITH: D. Wofford PharmD 10:25 12/28/17 ([email or web address])    Methicillin resistance NOT DETECTED NOT DETECTED Final   Streptococcus species NOT DETECTED NOT DETECTED Final   Streptococcus agalactiae NOT DETECTED NOT DETECTED Final   Streptococcus pneumoniae NOT DETECTED NOT DETECTED Final   Streptococcus pyogenes NOT DETECTED NOT DETECTED Final   Acinetobacter baumannii NOT DETECTED NOT DETECTED Final   Enterobacteriaceae species NOT DETECTED NOT DETECTED Final   Enterobacter cloacae complex NOT DETECTED NOT DETECTED Final   Escherichia coli NOT DETECTED NOT DETECTED Final   Klebsiella oxytoca NOT DETECTED NOT DETECTED Final   Klebsiella pneumoniae NOT DETECTED NOT DETECTED Final   Proteus species NOT DETECTED NOT DETECTED Final   Serratia marcescens NOT DETECTED NOT DETECTED Final   Haemophilus influenzae NOT DETECTED NOT DETECTED Final   Neisseria meningitidis NOT DETECTED NOT DETECTED Final   Pseudomonas aeruginosa NOT DETECTED NOT DETECTED Final   Candida albicans NOT DETECTED NOT DETECTED Final   Candida glabrata NOT DETECTED NOT DETECTED Final   Candida krusei NOT DETECTED NOT DETECTED Final   Candida parapsilosis NOT DETECTED NOT DETECTED Final   Candida tropicalis NOT DETECTED  NOT DETECTED Final    Comment: Performed at [facility], [address] [place], [place] [address]  Blood culture (routine x 2)     Status: Abnormal   Collection Time: 12/27/17  4:35 PM  Result Value Ref Range Status   Specimen Description   Final    BLOOD LEFT HAND Performed at [facility], [address] [place], [place] [address]    Special Requests   Final    BOTTLES DRAWN AEROBIC ONLY Blood Culture results may not be optimal due to an inadequate volume of blood received in culture bottles Performed at [facility], [address] [place], [place] [address]    Culture  Setup Time   Final    GRAM POSITIVE COCCI AEROBIC BOTTLE ONLY CRITICAL VALUE NOTED.  VALUE IS CONSISTENT WITH PREVIOUSLY REPORTED AND CALLED VALUE.    Culture (A)  Final    STAPHYLOCOCCUS AUREUS SUSCEPTIBILITIES PERFORMED ON PREVIOUS CULTURE WITHIN THE LAST 5 DAYS. Performed at [facility], [address] [place], [place] [address]    Report Status 12/30/2017 FINAL  Final  Aerobic/Anaerobic Culture (surgical/deep wound)     Status: None   Collection Time: 12/28/17  5:15 AM  Result Value Ref Range Status   Specimen Description   Final    WOUND RIGHT SHOULDER Performed at [facility], [address] [place], [place] [address]    Special Requests   Final    NONE Performed at [facility], [address] [place], [place] [address]    Gram Stain   Final    RARE WBC PRESENT, PREDOMINANTLY PMN RARE GRAM POSITIVE COCCI IN PAIRS    Culture   Final    FEW STAPHYLOCOCCUS AUREUS NO ANAEROBES ISOLATED Performed at [facility], [address] [place], [place] [address]    Report Status 01/02/2018 FINAL  Final   Organism ID, Bacteria STAPHYLOCOCCUS AUREUS  Final      Susceptibility   Staphylococcus aureus - MIC*    CIPROFLOXACIN <=0.5 SENSITIVE Sensitive     ERYTHROMYCIN <=0.25 SENSITIVE Sensitive     GENTAMICIN <=0.5  SENSITIVE Sensitive     OXACILLIN 0.5 SENSITIVE Sensitive     TETRACYCLINE <=1 SENSITIVE Sensitive  VANCOMYCIN 1 SENSITIVE Sensitive     TRIMETH/SULFA <=10 SENSITIVE Sensitive     CLINDAMYCIN <=0.25 SENSITIVE Sensitive     RIFAMPIN <=0.5 SENSITIVE Sensitive     Inducible Clindamycin NEGATIVE Sensitive     * FEW STAPHYLOCOCCUS AUREUS  Aerobic/Anaerobic Culture (surgical/deep wound)     Status: None   Collection Time: 12/28/17  5:33 AM  Result Value Ref Range Status   Specimen Description   Final    TISSUE RIGHT SHOULDER Performed at [facility], [address] [place], [place] [address]    Special Requests   Final    NONE Performed at [facility], [address] [place], [place] [address]    Gram Stain   Final    RARE WBC PRESENT, PREDOMINANTLY PMN RARE GRAM POSITIVE COCCI IN PAIRS RARE GRAM NEGATIVE RODS    Culture   Final    FEW STAPHYLOCOCCUS AUREUS NO ANAEROBES ISOLATED Performed at [facility], [address] [place], [place] [address]    Report Status 01/02/2018 FINAL  Final   Organism ID, Bacteria STAPHYLOCOCCUS AUREUS  Final      Susceptibility   Staphylococcus aureus - MIC*    CIPROFLOXACIN <=0.5 SENSITIVE Sensitive     ERYTHROMYCIN <=0.25 SENSITIVE Sensitive     GENTAMICIN <=0.5 SENSITIVE Sensitive     OXACILLIN 0.5 SENSITIVE Sensitive     TETRACYCLINE <=1 SENSITIVE Sensitive     VANCOMYCIN 1 SENSITIVE Sensitive     TRIMETH/SULFA <=10 SENSITIVE Sensitive     CLINDAMYCIN <=0.25 SENSITIVE Sensitive     RIFAMPIN <=0.5 SENSITIVE Sensitive     Inducible Clindamycin NEGATIVE Sensitive     * FEW STAPHYLOCOCCUS AUREUS  Culture, blood (routine x 2)     Status: None   Collection Time: 12/29/17  5:34 AM  Result Value Ref Range Status   Specimen Description BLOOD LEFT HAND  Final   Special Requests   Final    BOTTLES DRAWN AEROBIC AND ANAEROBIC Blood Culture adequate volume Performed at [facility], [address] [place], [place] [address]    Culture NO GROWTH 5 DAYS  Final   Report Status 01/03/2018 FINAL  Final  Culture, blood (routine x 2)     Status: None   Collection Time: 12/29/17  5:34 AM  Result Value Ref Range Status   Specimen Description BLOOD LEFT HAND  Final   Special Requests   Final    BOTTLES DRAWN AEROBIC AND ANAEROBIC Blood Culture adequate volume Performed at [facility], [address] [place], [place] [address]    Culture NO GROWTH 5 DAYS  Final   Report Status 01/03/2018 FINAL  Final         Radiology Studies: No results found.      Scheduled Meds: . buPROPion  300 mg Oral Daily  . docusate sodium  100 mg Oral BID  . enoxaparin (LOVENOX) injection  40 mg Subcutaneous QHS  .  HYDROmorphone (DILAUDID) injection  0.5 mg Intravenous Once  . mirtazapine  15 mg Oral QHS  . nicotine  21 mg Transdermal Daily  . sodium chloride flush  3 mL Intravenous Q12H   Continuous Infusions: . sodium chloride 100 mL/hr at 01/04/18 1128  .  ceFAZolin (ANCEF) IV 2 g (01/04/18 1055)  . methocarbamol (ROBAXIN) IV Stopped (12/28/17 1154)     LOS: 8 days   Time spent - 25 Minutes  Barnetta ChapelSylvester I Nataliya Graig, MD [facility] Pager [phone]  If 7PM-7AM, please contact night-coverage [email or web address] Password [facility] 01/04/2018, 4:44 PM

## 2018-01-05 NOTE — Progress Notes (Signed)
Patient ID: Logan Hancock, male   DOB: 1990-06-06, 27 y.o.   MRN: [id number]  PROGRESS NOTE    Logan Hancock  WUJ:[id number] DOB: November 14, 1990 DOA: 12/27/2017 PCP: Patient, No Pcp Per   Brief Narrative:  27 year old male with history of polysubstance abuse including cocaine, IV heroin and marijuana; bipolar disorder presented with 5 days of right shoulder pain and swelling.  He was found to be febrile in the ED with elevated CRP.  Orthopedics was consulted for probable septic right shoulder.  MRI revealed signs concerning for septic arthritis.  Patient was started on antibiotics.  He was also found to have staph bacteremia, ID was consulted.  12/30/2017: Available records reviewed.  Patient seen.  No new complaints.  No documented fever or chills.  Infectious disease and orthopedic input appreciated.  No chest pain.  No nausea or vomiting.  12/31/2017: No complaints.  No fever or chills.  Continue IV antibiotics.  Infectious disease team.  Is appreciated.  Further management depend on hospital course.  No new complaints.  01/01/2018: Patient seen.  No new complaints.  Repeat blood cultures have not grown any organisms to date.  Infectious disease input is highly appreciated.  01/05/2018: No new complaints.  Complete course of antibiotics.   Assessment & Plan:   Active Problems:   Polysubstance abuse (HCC)   Heroin use disorder, severe (HCC)   Sepsis (HCC)   Septic arthritis of shoulder, right (HCC)   Hyponatremia   Infection of shoulder (HCC)  Sepsis secondary to septic joint -Antibiotics plan as below.  Decrease normal saline to 100 cc an hour.  Blood pressure improving.  Monitor 12/30/2017: IV antibiotics as per infectious disease team.  For possible TEE. 12/31/2017: Sepsis physiology has resolved. 01/04/2018: Complete course of antibiotics.  Defer timing of the TEE to the infectious disease team.  Right acromioclavicular joint septic arthritis -Confirmed by MRI. -Status post I&D  on 12/28/2017.  Follow OR cultures; Gram stain showing gram-positive cocci in pairs.   -Initially started on broad-spectrum antibiotics which were switched to IV Ancef by ID on 12/28/2017. 12/30/2017: Complete course of antibiotics.  Leukocytosis -Probably secondary to above.    Acute kidney injury  -Probably secondary to above.  Improving.  Repeat a.m. labs.  IV fluids plan as above. 12/30/2017: Resolved.  Continue to monitor renal function and electrolytes.   Staph bacteremia -BCID suggests MSSA bacteremia.  Follow cultures and sensitivities.  Follow blood cultures from today. -2D echo was negative for any vegetation. 12/30/2017: For TEE. 01/01/2018: Repeat blood cultures have not grown any organisms to date.  Will defer timing of the TEE to the infectious disease.  Polysubstance abuse  -Reports recent use of heroin and methamphetamines -No withdrawal.    -Social worker consult for substance abuse  Thrombocytopenia -Monitor.  No signs of bleeding. 12/30/2017: Resolved.  Hypokalemia -Replace.  Repeat a.m. labs 12/30/2017: Resolved.   Hypomagnesemia -Replace.  Repeat a.m. labs. 12/30/2017: Resolved  Hypo-natremia/hypochloremia -Improved.  Monitor 12/30/2017: Resolved  Bipolar disorder -Patient apparently is no longer on medications for bipolar disorder.  Outpatient follow-up  History of asthma -Albuterol as needed.   DVT prophylaxis: Lovenox Code Status: Full Family Communication: None at bedside Disposition Plan: Depends on clinical outcome  Consultants: Orthopedics.  ID  Procedures: I&D of right acromioclavicular joint Echo on 12/28/17 Study Conclusions  - Procedure narrative: Transthoracic echocardiography. Image   quality was suboptimal. The study was technically difficult, as a   result of restricted patient mobility. - Left ventricle: The  cavity size was normal. Wall thickness was   normal. Systolic function was normal. The estimated ejection    fraction was in the range of 60% to 65%. Although no diagnostic   regional wall motion abnormality was identified, this possibility   cannot be completely excluded on the basis of this study. The   study is not technically sufficient to allow evaluation of LV   diastolic function. - Aortic valve: Transvalvular velocity was within the normal range.   There was no stenosis. There was no regurgitation. - Mitral valve: There was trivial regurgitation. - Left atrium: The atrium was normal in size. - Right ventricle: The cavity size was normal. Wall thickness was   normal. Systolic function was normal. - Right atrium: The atrium was normal in size. Central venous   pressure (est): 15 mm Hg. - Tricuspid valve: There was trivial regurgitation. - Pulmonic valve: There was trivial regurgitation. - Pulmonary arteries: Systolic pressure could not be accurately   estimated. - Inferior vena cava: The vessel was dilated. The respirophasic   diameter changes were blunted (< 50%). - Pericardium, extracardiac: There was no pericardial effusion.  Impressions:  - There was no evidence of a vegetation.  Recommendations:  Image quality suboptimal for detection of small vegetations. Consider TEE if clinically indicated.   Antimicrobials: Cefepime, Flagyl, vancomycin from 12/27/2017-12/28/17 Cefazolin from 12/28/2017 onwards   Subjective: Patient seen  No fever or chills.   No shortness of breath   Objective: Vitals:   01/04/18 1403 01/04/18 2217 01/05/18 0623 01/05/18 1430  BP: (!) 162/89 136/62 138/75 (!) 168/83  Pulse: 76 84 76 81  Resp: 16 18 18 17   Temp: 98.1 F (36.7 C) 98.3 F (36.8 C) 98 F (36.7 C) 98.5 F (36.9 C)  TempSrc:  Oral Oral   SpO2: 100% 100% 98% 100%  Weight:      Height:        Intake/Output Summary (Last 24 hours) at 01/05/2018 1506 Last data filed at 01/05/2018 0600 Gross per 24 hour  Intake 2298 ml  Output -  Net 2298 ml   Filed Weights   12/27/17  1407  Weight: 104.3 kg    Examination:  General exam: Awake, alert, and in no obvious distress.  Respiratory system: Clear to auscultation.   Cardiovascular system: S1 & S2 heard. Gastrointestinal system: Abdomen is nondistended, soft and nontender. Normal bowel sounds heard. Extremities: No cyanosis, clubbing, edema.  Right shoulder dressing present    Data Reviewed: I have personally reviewed following labs and imaging studies  CBC: Recent Labs  Lab 12/30/17 0511 01/02/18 0509 01/04/18 0449  WBC 12.6* 12.2* 13.2*  NEUTROABS 9.8* 5.7 6.3  HGB 12.2* 11.5* 12.2*  HCT 39.4 36.7* 40.3  MCV 83.1 82.7 85.2  PLT 165 217 243   Basic Metabolic Panel: Recent Labs  Lab 12/30/17 0511 01/02/18 0509 01/04/18 0449  NA 137 141 141  K 4.0 3.7 3.8  CL 107 106 107  CO2 13* 23 23  GLUCOSE 124* 97 113*  BUN 10 10 10   CREATININE 0.85 0.70 0.72  CALCIUM 7.9* 8.0* 8.3*  MG 2.2 1.8 2.0  PHOS  --  3.8 4.2   GFR: Estimated Creatinine Clearance: 187.3 mL/min (by C-G formula based on SCr of 0.72 mg/dL). Liver Function Tests: Recent Labs  Lab 12/30/17 0511 01/02/18 0509 01/04/18 0449  AST 64*  --   --   ALT 62*  --   --   ALKPHOS 144*  --   --  BILITOT 3.2*  --   --   PROT 6.0*  --   --   ALBUMIN 2.7* 2.6* 2.9*   No results for input(s): LIPASE, AMYLASE in the last 168 hours. No results for input(s): AMMONIA in the last 168 hours. Coagulation Profile: No results for input(s): INR, PROTIME in the last 168 hours. Cardiac Enzymes: No results for input(s): CKTOTAL, CKMB, CKMBINDEX, TROPONINI in the last 168 hours. BNP (last 3 results) No results for input(s): PROBNP in the last 8760 hours. HbA1C: No results for input(s): HGBA1C in the last 72 hours. CBG: No results for input(s): GLUCAP in the last 168 hours. Lipid Profile: No results for input(s): CHOL, HDL, LDLCALC, TRIG, CHOLHDL, LDLDIRECT in the last 72 hours. Thyroid Function Tests: No results for input(s): TSH,  T4TOTAL, FREET4, T3FREE, THYROIDAB in the last 72 hours. Anemia Panel: No results for input(s): VITAMINB12, FOLATE, FERRITIN, TIBC, IRON, RETICCTPCT in the last 72 hours. Sepsis Labs: No results for input(s): PROCALCITON, LATICACIDVEN in the last 168 hours.  Recent Results (from the past 240 hour(s))  Blood culture (routine x 2)     Status: Abnormal   Collection Time: 12/27/17  4:19 PM  Result Value Ref Range Status   Specimen Description   Final    BLOOD LEFT ANTECUBITAL Performed at [facility], [address] [place], [place] [address]    Special Requests   Final    BOTTLES DRAWN AEROBIC AND ANAEROBIC Blood Culture results may not be optimal due to an excessive volume of blood received in culture bottles Performed at [facility], [address] [place], [place] [address]    Culture  Setup Time   Final    GRAM POSITIVE COCCI IN CLUSTERS IN BOTH AEROBIC AND ANAEROBIC BOTTLES CRITICAL RESULT CALLED TO, READ BACK BY AND VERIFIED WITH: D. Wofford PharmD 10:25 12/28/17 ([email or web address]) Performed at [facility], [address] [place], [place] [address]    Culture STAPHYLOCOCCUS AUREUS (A)  Final   Report Status 12/30/2017 FINAL  Final   Organism ID, Bacteria STAPHYLOCOCCUS AUREUS  Final      Susceptibility   Staphylococcus aureus - MIC*    CIPROFLOXACIN <=0.5 SENSITIVE Sensitive     ERYTHROMYCIN <=0.25 SENSITIVE Sensitive     GENTAMICIN <=0.5 SENSITIVE Sensitive     OXACILLIN 0.5 SENSITIVE Sensitive     TETRACYCLINE <=1 SENSITIVE Sensitive     VANCOMYCIN <=0.5 SENSITIVE Sensitive     TRIMETH/SULFA <=10 SENSITIVE Sensitive     CLINDAMYCIN <=0.25 SENSITIVE Sensitive     RIFAMPIN <=0.5 SENSITIVE Sensitive     Inducible Clindamycin NEGATIVE Sensitive     * STAPHYLOCOCCUS AUREUS  Blood Culture ID Panel (Reflexed)     Status: Abnormal   Collection Time: 12/27/17  4:19 PM  Result Value Ref Range Status   Enterococcus species NOT DETECTED NOT  DETECTED Final   Listeria monocytogenes NOT DETECTED NOT DETECTED Final   Staphylococcus species DETECTED (A) NOT DETECTED Final    Comment: CRITICAL RESULT CALLED TO, READ BACK BY AND VERIFIED WITH: D. Wofford PharmD 10:25 12/28/17 ([email or web address])    Staphylococcus aureus (BCID) DETECTED (A) NOT DETECTED Final    Comment: Methicillin (oxacillin) susceptible Staphylococcus aureus (MSSA). Preferred therapy is anti staphylococcal beta lactam antibiotic (Cefazolin or Nafcillin), unless clinically contraindicated. CRITICAL RESULT CALLED TO, READ BACK BY AND VERIFIED WITH: D. Wofford PharmD 10:25 12/28/17 ([email or web address])    Methicillin resistance NOT DETECTED NOT DETECTED Final   Streptococcus species NOT DETECTED NOT  DETECTED Final   Streptococcus agalactiae NOT DETECTED NOT DETECTED Final   Streptococcus pneumoniae NOT DETECTED NOT DETECTED Final   Streptococcus pyogenes NOT DETECTED NOT DETECTED Final   Acinetobacter baumannii NOT DETECTED NOT DETECTED Final   Enterobacteriaceae species NOT DETECTED NOT DETECTED Final   Enterobacter cloacae complex NOT DETECTED NOT DETECTED Final   Escherichia coli NOT DETECTED NOT DETECTED Final   Klebsiella oxytoca NOT DETECTED NOT DETECTED Final   Klebsiella pneumoniae NOT DETECTED NOT DETECTED Final   Proteus species NOT DETECTED NOT DETECTED Final   Serratia marcescens NOT DETECTED NOT DETECTED Final   Haemophilus influenzae NOT DETECTED NOT DETECTED Final   Neisseria meningitidis NOT DETECTED NOT DETECTED Final   Pseudomonas aeruginosa NOT DETECTED NOT DETECTED Final   Candida albicans NOT DETECTED NOT DETECTED Final   Candida glabrata NOT DETECTED NOT DETECTED Final   Candida krusei NOT DETECTED NOT DETECTED Final   Candida parapsilosis NOT DETECTED NOT DETECTED Final   Candida tropicalis NOT DETECTED NOT DETECTED Final    Comment: Performed at [facility], [address] [place], [place] [address]  Blood culture (routine x 2)     Status: Abnormal     Collection Time: 12/27/17  4:35 PM  Result Value Ref Range Status   Specimen Description   Final    BLOOD LEFT HAND Performed at [facility], [address], [place], [place] [address]    Special Requests   Final    BOTTLES DRAWN AEROBIC ONLY Blood Culture results may not be optimal due to an inadequate volume of blood received in culture bottles Performed at [facility], [address], [place], [place] [address]    Culture  Setup Time   Final    GRAM POSITIVE COCCI AEROBIC BOTTLE ONLY CRITICAL VALUE NOTED.  VALUE IS CONSISTENT WITH PREVIOUSLY REPORTED AND CALLED VALUE.    Culture (A)  Final    STAPHYLOCOCCUS AUREUS SUSCEPTIBILITIES PERFORMED ON PREVIOUS CULTURE WITHIN THE LAST 5 DAYS. Performed at [facility], [address] [place], [place] [address]    Report Status 12/30/2017 FINAL  Final  Aerobic/Anaerobic Culture (surgical/deep wound)     Status: None   Collection Time: 12/28/17  5:15 AM  Result Value Ref Range Status   Specimen Description   Final    WOUND RIGHT SHOULDER Performed at [facility], [address], [place], [place] [address]    Special Requests   Final    NONE Performed at [facility], [address], [place], [place] [address]    Gram Stain   Final    RARE WBC PRESENT, PREDOMINANTLY PMN RARE GRAM POSITIVE COCCI IN PAIRS    Culture   Final    FEW STAPHYLOCOCCUS AUREUS NO ANAEROBES ISOLATED Performed at [facility], [address], [place], [place] [address]    Report Status 01/02/2018 FINAL  Final   Organism ID, Bacteria STAPHYLOCOCCUS AUREUS  Final      Susceptibility   Staphylococcus aureus - MIC*    CIPROFLOXACIN <=0.5 SENSITIVE Sensitive     ERYTHROMYCIN <=0.25 SENSITIVE Sensitive     GENTAMICIN <=0.5 SENSITIVE Sensitive     OXACILLIN 0.5 SENSITIVE Sensitive     TETRACYCLINE <=1 SENSITIVE Sensitive     VANCOMYCIN 1 SENSITIVE Sensitive     TRIMETH/SULFA <=10  SENSITIVE Sensitive     CLINDAMYCIN <=0.25 SENSITIVE Sensitive     RIFAMPIN <=0.5 SENSITIVE Sensitive  Inducible Clindamycin NEGATIVE Sensitive     * FEW STAPHYLOCOCCUS AUREUS  Aerobic/Anaerobic Culture (surgical/deep wound)     Status: None   Collection Time: 12/28/17  5:33 AM  Result Value Ref Range Status   Specimen Description   Final    TISSUE RIGHT SHOULDER Performed at [facility], [address], [place], [place] [address]    Special Requests   Final    NONE Performed at [facility], [address], [place], [place] [address]    Gram Stain   Final    RARE WBC PRESENT, PREDOMINANTLY PMN RARE GRAM POSITIVE COCCI IN PAIRS RARE GRAM NEGATIVE RODS    Culture   Final    FEW STAPHYLOCOCCUS AUREUS NO ANAEROBES ISOLATED Performed at [facility], [address], [place], [place] [address]    Report Status 01/02/2018 FINAL  Final   Organism ID, Bacteria STAPHYLOCOCCUS AUREUS  Final      Susceptibility   Staphylococcus aureus - MIC*    CIPROFLOXACIN <=0.5 SENSITIVE Sensitive     ERYTHROMYCIN <=0.25 SENSITIVE Sensitive     GENTAMICIN <=0.5 SENSITIVE Sensitive     OXACILLIN 0.5 SENSITIVE Sensitive     TETRACYCLINE <=1 SENSITIVE Sensitive     VANCOMYCIN 1 SENSITIVE Sensitive     TRIMETH/SULFA <=10 SENSITIVE Sensitive     CLINDAMYCIN <=0.25 SENSITIVE Sensitive     RIFAMPIN <=0.5 SENSITIVE Sensitive     Inducible Clindamycin NEGATIVE Sensitive     * FEW STAPHYLOCOCCUS AUREUS  Culture, blood (routine x 2)     Status: None   Collection Time: 12/29/17  5:34 AM  Result Value Ref Range Status   Specimen Description BLOOD LEFT HAND  Final   Special Requests   Final    BOTTLES DRAWN AEROBIC AND ANAEROBIC Blood Culture adequate volume Performed at [facility], [address] [place], [place] [address]    Culture NO GROWTH 5 DAYS  Final   Report Status 01/03/2018 FINAL  Final  Culture, blood (routine x 2)     Status: None    Collection Time: 12/29/17  5:34 AM  Result Value Ref Range Status   Specimen Description BLOOD LEFT HAND  Final   Special Requests   Final    BOTTLES DRAWN AEROBIC AND ANAEROBIC Blood Culture adequate volume Performed at [facility], [address] [place], [place] [address]    Culture NO GROWTH 5 DAYS  Final   Report Status 01/03/2018 FINAL  Final         Radiology Studies: No results found.      Scheduled Meds: . buPROPion  300 mg Oral Daily  . docusate sodium  100 mg Oral BID  . enoxaparin (LOVENOX) injection  40 mg Subcutaneous QHS  .  HYDROmorphone (DILAUDID) injection  0.5 mg Intravenous Once  . mirtazapine  15 mg Oral QHS  . nicotine  21 mg Transdermal Daily  . sodium chloride flush  3 mL Intravenous Q12H   Continuous Infusions: . sodium chloride 100 mL/hr at 01/05/18 0616  .  ceFAZolin (ANCEF) IV 2 g (01/05/18 0953)  . methocarbamol (ROBAXIN) IV Stopped (12/28/17 1154)     LOS: 9 days   Time spent - 25 Minutes  Barnetta Chapel, MD [facility] Pager [phone] If 7PM-7AM, please contact night-coverage [email or web address] Password [facility] 01/05/2018, 3:06 PM

## 2018-01-06 NOTE — Progress Notes (Signed)
Occupational Therapy Treatment Patient Details Name: Logan Hancock MRN: [id number] DOB: Feb 27, 1990 Today's Date: 01/06/2018    History of present illness s/p I & D of R shoulder/AC joint, r/o sepsis.  H/O drug use, bipolar, MRSA   OT comments  Pt independent with HEP now.  Performing AAROM for shoulder FF to 90; AROM limited to 20.  He is working within [employer] by MD.  Goals met  Follow Up Recommendations  Follow surgeon's recommendation for DC plan and follow-up therapies    Equipment Recommendations  None recommended by OT    Recommendations for Other Services      Precautions / Restrictions Precautions Precautions: Shoulder Type of Shoulder Precautions: sling on except for bathing, dressing, exercises.  OK to move elbow to fingers; P/AROM of FF to 90, abd to 60 and ER to 30 Precaution Booklet Issued: Yes (comment) Restrictions Other Position/Activity Restrictions: NWB RUE       Mobility Bed Mobility                  Transfers                      Balance                                           ADL either performed or assessed with clinical judgement   ADL                                         General ADL Comments: pt mod I per last OT note     Vision       Perception     Praxis      Cognition Arousal/Alertness: Awake/alert Behavior During Therapy: WFL for tasks assessed/performed Overall Cognitive Status: Within Functional Limits for tasks assessed                                          Exercises Other Exercises Other Exercises: return demonstrated HEP. Pt able to actively move RUE 60 abduction and 30 ER.  FF AROM limited to 20, but AAROM with dowel is 90.   Other Exercises: re-educated on palm in with elbow ROM   Shoulder Instructions       General Comments pt is not wearing sling; encouraged him to wear it especially when sleeping as he can't control what is arm  does when he is asleep    Pertinent Vitals/ Pain       Pain Score: 4  Pain Location: r shoulder Pain Descriptors / Indicators: Sore Pain Intervention(s): Limited activity within patient's tolerance;Monitored during session;Premedicated before session;Repositioned;Ice applied  Home Living                                          Prior Functioning/Environment              Frequency           Progress Toward Goals  OT Goals(current goals can now be found in the care plan section)  Progress towards OT goals: Goals met/education  completed, patient discharged from [facility] OT "6 Clicks" Daily Activity     Outcome Measure   Help from another person eating meals?: None Help from another person taking care of personal grooming?: None Help from another person toileting, which includes using toliet, bedpan, or urinal?: None Help from another person bathing (including washing, rinsing, drying)?: None Help from another person to put on and taking off regular upper body clothing?: None Help from another person to put on and taking off regular lower body clothing?: None 6 Click Score: 24    End of Session    OT Visit Diagnosis: Pain;Muscle weakness (generalized) (M62.81) Pain - Right/Left: Right   Activity Tolerance Patient tolerated treatment well   Patient Left with call bell/phone within reach;in bed   Nurse Communication          Time: 5369-2230 OT Time Calculation (min): 9 min  Charges: OT General Charges $OT Visit: 1 Visit OT Treatments $Therapeutic Exercise: 8-22 mins  Lesle Chris, OTR/L Acute Rehabilitation Services [phone] [facility] pager [phone] office 01/06/2018   [place] 01/06/2018, 9:19 AM

## 2018-01-06 NOTE — Progress Notes (Signed)
Patient ID: Logan Hancock, male   DOB: 10/01/1990, 27 y.o.   MRN: [id number]  PROGRESS NOTE    Logan Jumboyler K Hove  WNU:[id number] DOB: 09/27/1990 DOA: 12/27/2017 PCP: Patient, No Pcp Per   Brief Narrative:  27 year old male with history of polysubstance abuse including cocaine, IV heroin and marijuana; bipolar disorder presented with 5 days of right shoulder pain and swelling.  He was found to be febrile in the ED with elevated CRP.  Orthopedics was consulted for probable septic right shoulder.  MRI revealed signs concerning for septic arthritis.  Patient was started on antibiotics.  He was also found to have staph bacteremia, ID was consulted.  12/30/2017: Available records reviewed.  Patient seen.  No new complaints.  No documented fever or chills.  Infectious disease and orthopedic input appreciated.  No chest pain.  No nausea or vomiting.  12/31/2017: No complaints.  No fever or chills.  Continue IV antibiotics.  Infectious disease team.  Is appreciated.  Further management depend on hospital course.  No new complaints.  01/01/2018: Patient seen.  No new complaints.  Repeat blood cultures have not grown any organisms to date.  Infectious disease input is highly appreciated.  01/06/2018: No new complaints.  Complete course of antibiotics.   Assessment & Plan:   Active Problems:   Polysubstance abuse (HCC)   Heroin use disorder, severe (HCC)   Sepsis (HCC)   Septic arthritis of shoulder, right (HCC)   Hyponatremia   Infection of shoulder (HCC)  Sepsis secondary to septic joint -Antibiotics plan as below.  Decrease normal saline to 100 cc an hour.  Blood pressure improving.  Monitor 12/30/2017: IV antibiotics as per infectious disease team.  For possible TEE. 12/31/2017: Sepsis physiology has resolved. 01/04/2018: Complete course of antibiotics.  Defer timing of the TEE to the infectious disease team.  Right acromioclavicular joint septic arthritis -Confirmed by MRI. -Status post I&D  on 12/28/2017.  Follow OR cultures; Gram stain showing gram-positive cocci in pairs.   -Initially started on broad-spectrum antibiotics which were switched to IV Ancef by ID on 12/28/2017. 12/30/2017: Complete course of antibiotics.  Leukocytosis -Probably secondary to above.    Acute kidney injury  -Probably secondary to above.  Improving.  Repeat a.m. labs.  IV fluids plan as above. 12/30/2017: Resolved.  Continue to monitor renal function and electrolytes.   Staph bacteremia -BCID suggests MSSA bacteremia.  Follow cultures and sensitivities.  Follow blood cultures from today. -2D echo was negative for any vegetation. 12/30/2017: For TEE. 01/01/2018: Repeat blood cultures have not grown any organisms to date.  Will defer timing of the TEE to the infectious disease.  Polysubstance abuse  -Reports recent use of heroin and methamphetamines -No withdrawal.    -Social worker consult for substance abuse  Thrombocytopenia -Monitor.  No signs of bleeding. 12/30/2017: Resolved.  Hypokalemia -Replace.  Repeat a.m. labs 12/30/2017: Resolved.   Hypomagnesemia -Replace.  Repeat a.m. labs. 12/30/2017: Resolved  Hypo-natremia/hypochloremia -Improved.  Monitor 12/30/2017: Resolved  Bipolar disorder -Patient apparently is no longer on medications for bipolar disorder.  Outpatient follow-up  History of asthma -Albuterol as needed.   DVT prophylaxis: Lovenox Code Status: Full Family Communication: None at bedside Disposition Plan: Depends on clinical outcome  Consultants: Orthopedics.  ID  Procedures: I&D of right acromioclavicular joint Echo on 12/28/17 Study Conclusions  - Procedure narrative: Transthoracic echocardiography. Image   quality was suboptimal. The study was technically difficult, as a   result of restricted patient mobility. - Left ventricle: The  cavity size was normal. Wall thickness was   normal. Systolic function was normal. The estimated ejection    fraction was in the range of 60% to 65%. Although no diagnostic   regional wall motion abnormality was identified, this possibility   cannot be completely excluded on the basis of this study. The   study is not technically sufficient to allow evaluation of LV   diastolic function. - Aortic valve: Transvalvular velocity was within the normal range.   There was no stenosis. There was no regurgitation. - Mitral valve: There was trivial regurgitation. - Left atrium: The atrium was normal in size. - Right ventricle: The cavity size was normal. Wall thickness was   normal. Systolic function was normal. - Right atrium: The atrium was normal in size. Central venous   pressure (est): 15 mm Hg. - Tricuspid valve: There was trivial regurgitation. - Pulmonic valve: There was trivial regurgitation. - Pulmonary arteries: Systolic pressure could not be accurately   estimated. - Inferior vena cava: The vessel was dilated. The respirophasic   diameter changes were blunted (< 50%). - Pericardium, extracardiac: There was no pericardial effusion.  Impressions:  - There was no evidence of a vegetation.  Recommendations:  Image quality suboptimal for detection of small vegetations. Consider TEE if clinically indicated.   Antimicrobials: Cefepime, Flagyl, vancomycin from 12/27/2017-12/28/17 Cefazolin from 12/28/2017 onwards   Subjective: Patient seen  No fever or chills.   No shortness of breath   Objective: Vitals:   01/05/18 0623 01/05/18 1430 01/05/18 2057 01/06/18 0502  BP: 138/75 (!) 168/83 139/65 (!) 146/74  Pulse: 76 81 97 72  Resp: 18 17 14 12   Temp: 98 F (36.7 C) 98.5 F (36.9 C) 98.4 F (36.9 C) 98.4 F (36.9 C)  TempSrc: Oral  Oral Oral  SpO2: 98% 100% 99% 100%  Weight:      Height:        Intake/Output Summary (Last 24 hours) at 01/06/2018 1222 Last data filed at 01/06/2018 1026 Gross per 24 hour  Intake 3374.78 ml  Output -  Net 3374.78 ml   Filed Weights    12/27/17 1407  Weight: 104.3 kg    Examination:  General exam: Awake, alert, and in no obvious distress.  Respiratory system: Clear to auscultation.   Cardiovascular system: S1 & S2 heard. Gastrointestinal system: Abdomen is nondistended, soft and nontender. Normal bowel sounds heard. Extremities: No cyanosis, clubbing, edema.  Right shoulder dressing present    Data Reviewed: I have personally reviewed following labs and imaging studies  CBC: Recent Labs  Lab 01/02/18 0509 01/04/18 0449  WBC 12.2* 13.2*  NEUTROABS 5.7 6.3  HGB 11.5* 12.2*  HCT 36.7* 40.3  MCV 82.7 85.2  PLT 217 243   Basic Metabolic Panel: Recent Labs  Lab 01/02/18 0509 01/04/18 0449  NA 141 141  K 3.7 3.8  CL 106 107  CO2 23 23  GLUCOSE 97 113*  BUN 10 10  CREATININE 0.70 0.72  CALCIUM 8.0* 8.3*  MG 1.8 2.0  PHOS 3.8 4.2   GFR: Estimated Creatinine Clearance: 187.3 mL/min (by C-G formula based on SCr of 0.72 mg/dL). Liver Function Tests: Recent Labs  Lab 01/02/18 0509 01/04/18 0449  ALBUMIN 2.6* 2.9*   No results for input(s): LIPASE, AMYLASE in the last 168 hours. No results for input(s): AMMONIA in the last 168 hours. Coagulation Profile: No results for input(s): INR, PROTIME in the last 168 hours. Cardiac Enzymes: No results for input(s): CKTOTAL, CKMB, CKMBINDEX, TROPONINI  in the last 168 hours. BNP (last 3 results) No results for input(s): PROBNP in the last 8760 hours. HbA1C: No results for input(s): HGBA1C in the last 72 hours. CBG: No results for input(s): GLUCAP in the last 168 hours. Lipid Profile: No results for input(s): CHOL, HDL, LDLCALC, TRIG, CHOLHDL, LDLDIRECT in the last 72 hours. Thyroid Function Tests: No results for input(s): TSH, T4TOTAL, FREET4, T3FREE, THYROIDAB in the last 72 hours. Anemia Panel: No results for input(s): VITAMINB12, FOLATE, FERRITIN, TIBC, IRON, RETICCTPCT in the last 72 hours. Sepsis Labs: No results for input(s): PROCALCITON,  LATICACIDVEN in the last 168 hours.  Recent Results (from the past 240 hour(s))  Blood culture (routine x 2)     Status: Abnormal   Collection Time: 12/27/17  4:19 PM  Result Value Ref Range Status   Specimen Description   Final    BLOOD LEFT ANTECUBITAL Performed at [facility], [address] [place], [place] [address]    Special Requests   Final    BOTTLES DRAWN AEROBIC AND ANAEROBIC Blood Culture results may not be optimal due to an excessive volume of blood received in culture bottles Performed at [facility], [address] [place], [place] [address]    Culture  Setup Time   Final    GRAM POSITIVE COCCI IN CLUSTERS IN BOTH AEROBIC AND ANAEROBIC BOTTLES CRITICAL RESULT CALLED TO, READ BACK BY AND VERIFIED WITH: D. Wofford PharmD 10:25 12/28/17 ([email or web address]) Performed at [facility], [address] [place], [place] [address]    Culture STAPHYLOCOCCUS AUREUS (A)  Final   Report Status 12/30/2017 FINAL  Final   Organism ID, Bacteria STAPHYLOCOCCUS AUREUS  Final      Susceptibility   Staphylococcus aureus - MIC*    CIPROFLOXACIN <=0.5 SENSITIVE Sensitive     ERYTHROMYCIN <=0.25 SENSITIVE Sensitive     GENTAMICIN <=0.5 SENSITIVE Sensitive     OXACILLIN 0.5 SENSITIVE Sensitive     TETRACYCLINE <=1 SENSITIVE Sensitive     VANCOMYCIN <=0.5 SENSITIVE Sensitive     TRIMETH/SULFA <=10 SENSITIVE Sensitive     CLINDAMYCIN <=0.25 SENSITIVE Sensitive     RIFAMPIN <=0.5 SENSITIVE Sensitive     Inducible Clindamycin NEGATIVE Sensitive     * STAPHYLOCOCCUS AUREUS  Blood Culture ID Panel (Reflexed)     Status: Abnormal   Collection Time: 12/27/17  4:19 PM  Result Value Ref Range Status   Enterococcus species NOT DETECTED NOT DETECTED Final   Listeria monocytogenes NOT DETECTED NOT DETECTED Final   Staphylococcus species DETECTED (A) NOT DETECTED Final    Comment: CRITICAL RESULT CALLED TO, READ BACK BY AND VERIFIED WITH: D. Wofford PharmD  10:25 12/28/17 ([email or web address])    Staphylococcus aureus (BCID) DETECTED (A) NOT DETECTED Final    Comment: Methicillin (oxacillin) susceptible Staphylococcus aureus (MSSA). Preferred therapy is anti staphylococcal beta lactam antibiotic (Cefazolin or Nafcillin), unless clinically contraindicated. CRITICAL RESULT CALLED TO, READ BACK BY AND VERIFIED WITH: D. Wofford PharmD 10:25 12/28/17 ([email or web address])    Methicillin resistance NOT DETECTED NOT DETECTED Final   Streptococcus species NOT DETECTED NOT DETECTED Final   Streptococcus agalactiae NOT DETECTED NOT DETECTED Final   Streptococcus pneumoniae NOT DETECTED NOT DETECTED Final   Streptococcus pyogenes NOT DETECTED NOT DETECTED Final   Acinetobacter baumannii NOT DETECTED NOT DETECTED Final   Enterobacteriaceae species NOT DETECTED NOT DETECTED Final   Enterobacter cloacae complex NOT DETECTED NOT DETECTED Final   Escherichia coli NOT DETECTED NOT DETECTED Final  Klebsiella oxytoca NOT DETECTED NOT DETECTED Final   Klebsiella pneumoniae NOT DETECTED NOT DETECTED Final   Proteus species NOT DETECTED NOT DETECTED Final   Serratia marcescens NOT DETECTED NOT DETECTED Final   Haemophilus influenzae NOT DETECTED NOT DETECTED Final   Neisseria meningitidis NOT DETECTED NOT DETECTED Final   Pseudomonas aeruginosa NOT DETECTED NOT DETECTED Final   Candida albicans NOT DETECTED NOT DETECTED Final   Candida glabrata NOT DETECTED NOT DETECTED Final   Candida krusei NOT DETECTED NOT DETECTED Final   Candida parapsilosis NOT DETECTED NOT DETECTED Final   Candida tropicalis NOT DETECTED NOT DETECTED Final    Comment: Performed at [facility], [address], [place], [place] [address]  Blood culture (routine x 2)     Status: Abnormal   Collection Time: 12/27/17  4:35 PM  Result Value Ref Range Status   Specimen Description   Final    BLOOD LEFT HAND Performed at [facility], [address], [place], [place] [address]     Special Requests   Final    BOTTLES DRAWN AEROBIC ONLY Blood Culture results may not be optimal due to an inadequate volume of blood received in culture bottles Performed at [facility], [address] [place], [place] [address]    Culture  Setup Time   Final    GRAM POSITIVE COCCI AEROBIC BOTTLE ONLY CRITICAL VALUE NOTED.  VALUE IS CONSISTENT WITH PREVIOUSLY REPORTED AND CALLED VALUE.    Culture (A)  Final    STAPHYLOCOCCUS AUREUS SUSCEPTIBILITIES PERFORMED ON PREVIOUS CULTURE WITHIN THE LAST 5 DAYS. Performed at [facility], [address] [place], [place] [address]    Report Status 12/30/2017 FINAL  Final  Aerobic/Anaerobic Culture (surgical/deep wound)     Status: None   Collection Time: 12/28/17  5:15 AM  Result Value Ref Range Status   Specimen Description   Final    WOUND RIGHT SHOULDER Performed at [facility], [address], [place], [place] [address]    Special Requests   Final    NONE Performed at [facility], [address], [place], [place] [address]    Gram Stain   Final    RARE WBC PRESENT, PREDOMINANTLY PMN RARE GRAM POSITIVE COCCI IN PAIRS    Culture   Final    FEW STAPHYLOCOCCUS AUREUS NO ANAEROBES ISOLATED Performed at [facility], [address] [place], [place] [address]    Report Status 01/02/2018 FINAL  Final   Organism ID, Bacteria STAPHYLOCOCCUS AUREUS  Final      Susceptibility   Staphylococcus aureus - MIC*    CIPROFLOXACIN <=0.5 SENSITIVE Sensitive     ERYTHROMYCIN <=0.25 SENSITIVE Sensitive     GENTAMICIN <=0.5 SENSITIVE Sensitive     OXACILLIN 0.5 SENSITIVE Sensitive     TETRACYCLINE <=1 SENSITIVE Sensitive     VANCOMYCIN 1 SENSITIVE Sensitive     TRIMETH/SULFA <=10 SENSITIVE Sensitive     CLINDAMYCIN <=0.25 SENSITIVE Sensitive     RIFAMPIN <=0.5 SENSITIVE Sensitive     Inducible Clindamycin NEGATIVE Sensitive     * FEW STAPHYLOCOCCUS AUREUS  Aerobic/Anaerobic Culture  (surgical/deep wound)     Status: None   Collection Time: 12/28/17  5:33 AM  Result Value Ref Range Status   Specimen Description   Final    TISSUE RIGHT SHOULDER Performed at [facility], [address] [place], [place] [address]    Special Requests  Final    NONE Performed at [facility], [address] [place], [place] [address]    Gram Stain   Final    RARE WBC PRESENT, PREDOMINANTLY PMN RARE GRAM POSITIVE COCCI IN PAIRS RARE GRAM NEGATIVE RODS    Culture   Final    FEW STAPHYLOCOCCUS AUREUS NO ANAEROBES ISOLATED Performed at [facility], [address] [place], [place] [address]    Report Status 01/02/2018 FINAL  Final   Organism ID, Bacteria STAPHYLOCOCCUS AUREUS  Final      Susceptibility   Staphylococcus aureus - MIC*    CIPROFLOXACIN <=0.5 SENSITIVE Sensitive     ERYTHROMYCIN <=0.25 SENSITIVE Sensitive     GENTAMICIN <=0.5 SENSITIVE Sensitive     OXACILLIN 0.5 SENSITIVE Sensitive     TETRACYCLINE <=1 SENSITIVE Sensitive     VANCOMYCIN 1 SENSITIVE Sensitive     TRIMETH/SULFA <=10 SENSITIVE Sensitive     CLINDAMYCIN <=0.25 SENSITIVE Sensitive     RIFAMPIN <=0.5 SENSITIVE Sensitive     Inducible Clindamycin NEGATIVE Sensitive     * FEW STAPHYLOCOCCUS AUREUS  Culture, blood (routine x 2)     Status: None   Collection Time: 12/29/17  5:34 AM  Result Value Ref Range Status   Specimen Description BLOOD LEFT HAND  Final   Special Requests   Final    BOTTLES DRAWN AEROBIC AND ANAEROBIC Blood Culture adequate volume Performed at [facility], [address] [place], [place] [address]    Culture NO GROWTH 5 DAYS  Final   Report Status 01/03/2018 FINAL  Final  Culture, blood (routine x 2)     Status: None   Collection Time: 12/29/17  5:34 AM  Result Value Ref Range Status   Specimen Description BLOOD LEFT HAND  Final   Special Requests   Final    BOTTLES DRAWN AEROBIC AND ANAEROBIC Blood Culture adequate  volume Performed at [facility], [address] [place], [place] [address]    Culture NO GROWTH 5 DAYS  Final   Report Status 01/03/2018 FINAL  Final         Radiology Studies: No results found.      Scheduled Meds: . buPROPion  300 mg Oral Daily  . docusate sodium  100 mg Oral BID  . enoxaparin (LOVENOX) injection  40 mg Subcutaneous QHS  .  HYDROmorphone (DILAUDID) injection  0.5 mg Intravenous Once  . mirtazapine  15 mg Oral QHS  . nicotine  21 mg Transdermal Daily  . sodium chloride flush  3 mL Intravenous Q12H   Continuous Infusions: . sodium chloride 100 mL/hr at 01/06/18 0700  .  ceFAZolin (ANCEF) IV 2 g (01/06/18 1052)  . methocarbamol (ROBAXIN) IV Stopped (12/28/17 1154)     LOS: 10 days   Time spent - 25 Minutes  Barnetta ChapelSylvester I Mayrani Khamis, MD [facility] Pager [phone] If 7PM-7AM, please contact night-coverage [email or web address] Password [email or web address] 01/06/2018, 12:22 PM

## 2018-01-06 NOTE — Plan of Care (Signed)
Plan of care reviewed and discussed with the patient. 

## 2018-01-07 DIAGNOSIS — M00011 Staphylococcal arthritis, right shoulder: Secondary | ICD-10-CM

## 2018-01-07 NOTE — Progress Notes (Signed)
Patient ID: Logan Hancock, male   DOB: 11/27/1990, 27 y.o.   MRN: [id number]  PROGRESS NOTE    Logan Hancock  WUJ:[id number] DOB: 01/09/1991 DOA: 12/27/2017 PCP: Patient, No Pcp Per   Brief Narrative:  27 year old male with history of polysubstance abuse including cocaine, IV heroin and marijuana; bipolar disorder presented with 5 days of right shoulder pain and swelling.  He was found to be febrile in the ED with elevated CRP.  Orthopedics was consulted for probable septic right shoulder.  MRI revealed signs concerning for septic arthritis.  Patient was started on antibiotics.  He was also found to have staph bacteremia, ID was consulted.  12/30/2017: Available records reviewed.  Patient seen.  No new complaints.  No documented fever or chills.  Infectious disease and orthopedic input appreciated.  No chest pain.  No nausea or vomiting.  12/31/2017: No complaints.  No fever or chills.  Continue IV antibiotics.  Infectious disease team.  Is appreciated.  Further management depend on hospital course.  No new complaints.  01/01/2018: Patient seen.  No new complaints.  Repeat blood cultures have not grown any organisms to date.  Infectious disease input is highly appreciated.  01/07/2018: No new complaints.  Complete course of antibiotics.  Timing of the TEE deferred to the infectious disease team.   Assessment & Plan:   Active Problems:   Polysubstance abuse (HCC)   Heroin use disorder, severe (HCC)   Sepsis (HCC)   Septic arthritis of shoulder, right (HCC)   Hyponatremia   Infection of shoulder (HCC)  Sepsis secondary to septic joint -Antibiotics plan as below.  Decrease normal saline to 100 cc an hour.  Blood pressure improving.  Monitor 12/30/2017: IV antibiotics as per infectious disease team.  For possible TEE. 12/31/2017: Sepsis physiology has resolved. 01/04/2018: Complete course of antibiotics.  Defer timing of the TEE to the infectious disease team.  Right acromioclavicular  joint septic arthritis -Confirmed by MRI. -Status post I&D on 12/28/2017.  Follow OR cultures; Gram stain showing gram-positive cocci in pairs.   -Initially started on broad-spectrum antibiotics which were switched to IV Ancef by ID on 12/28/2017. 12/30/2017: Complete course of antibiotics.  Leukocytosis -Probably secondary to above.    Acute kidney injury  -Probably secondary to above.  Improving.  Repeat a.m. labs.  IV fluids plan as above. 12/30/2017: Resolved.  Continue to monitor renal function and electrolytes.   Staph bacteremia -BCID suggests MSSA bacteremia.  Follow cultures and sensitivities.  Follow blood cultures from today. -2D echo was negative for any vegetation. 12/30/2017: For TEE. 01/01/2018: Repeat blood cultures have not grown any organisms to date.  Will defer timing of the TEE to the infectious disease.  Polysubstance abuse  -Reports recent use of heroin and methamphetamines -No withdrawal.    -Social worker consult for substance abuse  Thrombocytopenia -Monitor.  No signs of bleeding. 12/30/2017: Resolved.  Hypokalemia -Replace.  Repeat a.m. labs 12/30/2017: Resolved.   Hypomagnesemia -Replace.  Repeat a.m. labs. 12/30/2017: Resolved  Hypo-natremia/hypochloremia -Improved.  Monitor 12/30/2017: Resolved  Bipolar disorder -Patient apparently is no longer on medications for bipolar disorder.  Outpatient follow-up  History of asthma -Albuterol as needed.   DVT prophylaxis: Lovenox Code Status: Full Family Communication: None at bedside Disposition Plan: Depends on clinical outcome  Consultants: Orthopedics.  ID  Procedures: I&D of right acromioclavicular joint Echo on 12/28/17 Study Conclusions  - Procedure narrative: Transthoracic echocardiography. Image   quality was suboptimal. The study was technically difficult, as a  result of restricted patient mobility. - Left ventricle: The cavity size was normal. Wall thickness was    normal. Systolic function was normal. The estimated ejection   fraction was in the range of 60% to 65%. Although no diagnostic   regional wall motion abnormality was identified, this possibility   cannot be completely excluded on the basis of this study. The   study is not technically sufficient to allow evaluation of LV   diastolic function. - Aortic valve: Transvalvular velocity was within the normal range.   There was no stenosis. There was no regurgitation. - Mitral valve: There was trivial regurgitation. - Left atrium: The atrium was normal in size. - Right ventricle: The cavity size was normal. Wall thickness was   normal. Systolic function was normal. - Right atrium: The atrium was normal in size. Central venous   pressure (est): 15 mm Hg. - Tricuspid valve: There was trivial regurgitation. - Pulmonic valve: There was trivial regurgitation. - Pulmonary arteries: Systolic pressure could not be accurately   estimated. - Inferior vena cava: The vessel was dilated. The respirophasic   diameter changes were blunted (< 50%). - Pericardium, extracardiac: There was no pericardial effusion.  Impressions:  - There was no evidence of a vegetation.  Recommendations:  Image quality suboptimal for detection of small vegetations. Consider TEE if clinically indicated.   Antimicrobials: Cefepime, Flagyl, vancomycin from 12/27/2017-12/28/17 Cefazolin from 12/28/2017 onwards   Subjective: Patient seen  No fever or chills.   No shortness of breath   Objective: Vitals:   01/06/18 0502 01/06/18 1337 01/06/18 2155 01/07/18 0444  BP: (!) 146/74 135/71 135/69 (!) 117/55  Pulse: 72 89 95 75  Resp: 12 16  16   Temp: 98.4 F (36.9 C) 98.3 F (36.8 C) 98.7 F (37.1 C) 97.8 F (36.6 C)  TempSrc: Oral  Oral Oral  SpO2: 100% 100% 100% 100%  Weight:      Height:        Intake/Output Summary (Last 24 hours) at 01/07/2018 1035 Last data filed at 01/07/2018 0807 Gross per 24 hour    Intake 2991.91 ml  Output -  Net 2991.91 ml   Filed Weights   12/27/17 1407  Weight: 104.3 kg    Examination:  General exam: Awake, alert, and in no obvious distress.  Respiratory system: Clear to auscultation.   Cardiovascular system: S1 & S2 heard. Gastrointestinal system: Abdomen is nondistended, soft and nontender. Normal bowel sounds heard. Extremities: No cyanosis, clubbing, edema.  Right shoulder dressing present    Data Reviewed: I have personally reviewed following labs and imaging studies  CBC: Recent Labs  Lab 01/02/18 0509 01/04/18 0449  WBC 12.2* 13.2*  NEUTROABS 5.7 6.3  HGB 11.5* 12.2*  HCT 36.7* 40.3  MCV 82.7 85.2  PLT 217 243   Basic Metabolic Panel: Recent Labs  Lab 01/02/18 0509 01/04/18 0449  NA 141 141  K 3.7 3.8  CL 106 107  CO2 23 23  GLUCOSE 97 113*  BUN 10 10  CREATININE 0.70 0.72  CALCIUM 8.0* 8.3*  MG 1.8 2.0  PHOS 3.8 4.2   GFR: Estimated Creatinine Clearance: 187.3 mL/min (by C-G formula based on SCr of 0.72 mg/dL). Liver Function Tests: Recent Labs  Lab 01/02/18 0509 01/04/18 0449  ALBUMIN 2.6* 2.9*   No results for input(s): LIPASE, AMYLASE in the last 168 hours. No results for input(s): AMMONIA in the last 168 hours. Coagulation Profile: No results for input(s): INR, PROTIME in the last 168 hours.  Cardiac Enzymes: No results for input(s): CKTOTAL, CKMB, CKMBINDEX, TROPONINI in the last 168 hours. BNP (last 3 results) No results for input(s): PROBNP in the last 8760 hours. HbA1C: No results for input(s): HGBA1C in the last 72 hours. CBG: No results for input(s): GLUCAP in the last 168 hours. Lipid Profile: No results for input(s): CHOL, HDL, LDLCALC, TRIG, CHOLHDL, LDLDIRECT in the last 72 hours. Thyroid Function Tests: No results for input(s): TSH, T4TOTAL, FREET4, T3FREE, THYROIDAB in the last 72 hours. Anemia Panel: No results for input(s): VITAMINB12, FOLATE, FERRITIN, TIBC, IRON, RETICCTPCT in the last  72 hours. Sepsis Labs: No results for input(s): PROCALCITON, LATICACIDVEN in the last 168 hours.  Recent Results (from the past 240 hour(s))  Culture, blood (routine x 2)     Status: None   Collection Time: 12/29/17  5:34 AM  Result Value Ref Range Status   Specimen Description BLOOD LEFT HAND  Final   Special Requests   Final    BOTTLES DRAWN AEROBIC AND ANAEROBIC Blood Culture adequate volume Performed at [facility], [address] [place], [place] [address]    Culture NO GROWTH 5 DAYS  Final   Report Status 01/03/2018 FINAL  Final  Culture, blood (routine x 2)     Status: None   Collection Time: 12/29/17  5:34 AM  Result Value Ref Range Status   Specimen Description BLOOD LEFT HAND  Final   Special Requests   Final    BOTTLES DRAWN AEROBIC AND ANAEROBIC Blood Culture adequate volume Performed at [facility], [address] [place], [place] [address]    Culture NO GROWTH 5 DAYS  Final   Report Status 01/03/2018 FINAL  Final         Radiology Studies: No results found.      Scheduled Meds: . buPROPion  300 mg Oral Daily  . docusate sodium  100 mg Oral BID  . enoxaparin (LOVENOX) injection  40 mg Subcutaneous QHS  .  HYDROmorphone (DILAUDID) injection  0.5 mg Intravenous Once  . mirtazapine  15 mg Oral QHS  . nicotine  21 mg Transdermal Daily  . sodium chloride flush  3 mL Intravenous Q12H   Continuous Infusions: . sodium chloride 10 mL/hr at 01/07/18 0600  .  ceFAZolin (ANCEF) IV 2 g (01/07/18 0813)  . methocarbamol (ROBAXIN) IV Stopped (12/28/17 1154)     LOS: 11 days   Time spent - 25 Minutes  Barnetta ChapelSylvester I Ogbata, MD [facility] Pager [phone] If 7PM-7AM, please contact night-coverage [email or web address] Password [email or web address] 01/07/2018, 10:35 AM

## 2018-01-07 NOTE — Progress Notes (Signed)
[facility]    Date of Admission:  12/27/2017   Total days of antibiotics 12           ID: Logan Hancock is a 27 y.o. male with MSSA right septic arhtritis and bacteremia -s/pRight shoulder arthroscopic incision and drainage of the shoulder joint followed by open incision and drainage of an acromioclavicular joint and obtaining of deep tissue cultures from the shoulder on 12/19. Active Problems:   Polysubstance abuse (HCC)   Heroin use disorder, severe (HCC)   Sepsis (HCC)   Septic arthritis of shoulder, right (HCC)   Hyponatremia   Infection of shoulder (HCC)  Subjective: Afebrile but still having difficulty with pain   Medications:  . buPROPion  300 mg Oral Daily  . docusate sodium  100 mg Oral BID  . enoxaparin (LOVENOX) injection  40 mg Subcutaneous QHS  .  HYDROmorphone (DILAUDID) injection  0.5 mg Intravenous Once  . mirtazapine  15 mg Oral QHS  . nicotine  21 mg Transdermal Daily  . sodium chloride flush  3 mL Intravenous Q12H    Objective: Vital signs in last 24 hours: Temp:  [97.8 F (36.6 C)-98.7 F (37.1 C)] 98.6 F (37 C) (12/29 1444) Pulse Rate:  [75-95] 83 (12/29 1444) Resp:  [16] 16 (12/29 1444) BP: (117-135)/(55-70) 132/70 (12/29 1444) SpO2:  [100 %] 100 % (12/29 1444)  Physical Exam  Constitutional: He is oriented to person, place, and time. He appears well-developed and well-nourished. No distress.  HENT:  Mouth/Throat: Oropharynx is clear and moist. No oropharyngeal exudate.  Cardiovascular: Normal rate, regular rhythm and normal heart sounds. Exam reveals no gallop and no friction rub.  No murmur heard.  Pulmonary/Chest: Effort normal and breath sounds normal. No respiratory distress. He has no wheezes.  Abdominal: Soft. Bowel sounds are normal. He exhibits no distension. There is no tenderness.  Lymphadenopathy:  He has no cervical adenopathy.  Neurological: He is alert and oriented to person, place, and time.    XBJ:YNWGNExt:right shoulder limited ROM due to pain Skin: Skin is warm and dry. No rash noted. No erythema.  Psychiatric: He has a normal mood and affect. His behavior is normal.     Lab Results Lab Results  Component Value Date   ESRSEDRATE 9 12/27/2017   TTE: Left ventricle: The cavity size was normal. Wall thickness was   normal. Systolic function was normal. The estimated ejection   fraction was in the range of 60% to 65%. Although no diagnostic   regional wall motion abnormality was identified, this possibility   cannot be completely excluded on the basis of this study. The   study is not technically sufficient to allow evaluation of LV   diastolic function. - Aortic valve: Transvalvular velocity was within the normal range.   There was no stenosis. There was no regurgitation. - Mitral valve: There was trivial regurgitation. - Left atrium: The atrium was normal in size. - Right ventricle: The cavity size was normal. Wall thickness was   normal. Systolic function was normal. - Right atrium: The atrium was normal in size. Central venous   pressure (est): 15 mm Hg. - Tricuspid valve: There was trivial regurgitation. - Pulmonic valve: There was trivial regurgitation. - Pulmonary arteries: Systolic pressure could not be accurately Microbiology: 12/20 blood cx ngtd 12/19 synovial fluid shoulder MSSA 12/18 blood cx MSSA Studies/Results: No results found.   Assessment/Plan: MSSA septic arthritis of right shoulder s/p I X D = recommend  to do 4-6 wk of therapy of cefazolin  Currently has had 10 days of cefazolin since bacteremia has cleared.   MSSA bacteremia = continue on cefazolin TTE was technically difficult per report. Recommend to get TEE so that can determine if can treat for 4 wk vs 6 wk of abtx  Pain management = may need adjustment in his regimen. Defer to primary team  Hep C ab = will check hep C RNA to see if need treatment as an outpatient   Dr Ninetta LightsHatcher to provide  further recs   [facility] Cell: [phone] Pager: [phone]  01/07/2018, 5:28 PM

## 2018-01-08 DIAGNOSIS — F1910 Other psychoactive substance abuse, uncomplicated: Secondary | ICD-10-CM

## 2018-01-08 DIAGNOSIS — M00012 Staphylococcal arthritis, left shoulder: Secondary | ICD-10-CM

## 2018-01-08 DIAGNOSIS — R768 Other specified abnormal immunological findings in serum: Secondary | ICD-10-CM

## 2018-01-08 NOTE — H&P (View-Only) (Signed)
   INFECTIOUS DISEASE PROGRESS NOTE  ID: Logan Hancock is a 27 y.o. male with  Active Problems:   Polysubstance abuse (HCC)   Heroin use disorder, severe (HCC)   Sepsis (HCC)   Septic arthritis of shoulder, right (HCC)   Hyponatremia   Infection of shoulder (HCC)  Subjective: No complaints  Abtx:  Anti-infectives (From admission, onward)   Start     Dose/Rate Route Frequency Ordered Stop   12/28/17 1600  ceFAZolin (ANCEF) IVPB 2g/100 mL premix     2 g 200 mL/hr over 30 Minutes Intravenous Every 8 hours 12/28/17 1540     12/28/17 0200  ceFEPIme (MAXIPIME) 2 g in sodium chloride 0.9 % 100 mL IVPB  Status:  Discontinued     2 g 200 mL/hr over 30 Minutes Intravenous Every 8 hours 12/27/17 2022 12/28/17 1540   12/27/17 2200  metroNIDAZOLE (FLAGYL) IVPB 500 mg  Status:  Discontinued     500 mg 100 mL/hr over 60 Minutes Intravenous Every 8 hours 12/27/17 2000 12/28/17 1540   12/27/17 1745  vancomycin (VANCOCIN) 2,000 mg in sodium chloride 0.9 % 500 mL IVPB     2,000 mg 250 mL/hr over 120 Minutes Intravenous  Once 12/27/17 1740 12/27/17 2259   12/27/17 1745  ceFEPIme (MAXIPIME) 2 g in sodium chloride 0.9 % 100 mL IVPB     2 g 200 mL/hr over 30 Minutes Intravenous  Once 12/27/17 1740 12/27/17 1840      Medications:  Scheduled: . buPROPion  300 mg Oral Daily  . docusate sodium  100 mg Oral BID  . enoxaparin (LOVENOX) injection  40 mg Subcutaneous QHS  .  HYDROmorphone (DILAUDID) injection  0.5 mg Intravenous Once  . mirtazapine  15 mg Oral QHS  . nicotine  21 mg Transdermal Daily  . sodium chloride flush  3 mL Intravenous Q12H    Objective: Vital signs in last 24 hours: Temp:  [97.7 F (36.5 C)-98.7 F (37.1 C)] 98.3 F (36.8 C) (12/30 1306) Pulse Rate:  [76-87] 76 (12/30 1306) Resp:  [14-16] 14 (12/30 1306) BP: (119-176)/(68-83) 176/83 (12/30 1306) SpO2:  [100 %] 100 % (12/30 1306)   General appearance: alert, cooperative and no distress Resp: clear to auscultation  bilaterally Cardio: regular rate and rhythm GI: normal findings: bowel sounds normal and soft, non-tender  Lab Results No results for input(s): WBC, HGB, HCT, NA, K, CL, CO2, BUN, CREATININE, GLU in the last 72 hours.  Invalid input(s): PLATELETS Liver Panel No results for input(s): PROT, ALBUMIN, AST, ALT, ALKPHOS, BILITOT, BILIDIR, IBILI in the last 72 hours. Sedimentation Rate No results for input(s): ESRSEDRATE in the last 72 hours. C-Reactive Protein No results for input(s): CRP in the last 72 hours.  Microbiology: No results found for this or any previous visit (from the past 240 hour(s)).  Studies/Results: No results found.   Assessment/Plan: IVDA L shoulder septic arthritis (MSSA) MSSA bacteremia Hep C Ab+  Total days of antibiotics: 11 ancef  Will check Hep C RNA, genotype For TEE in AM to help determine length of therapy Repeat BCx 12-20 ngtd referral to [facility] suboxonne clinic as outpt.          Camila Norville MD, FACP Infectious Diseases (pager) [phone] [email or web address] 01/08/2018, 5:47 PM  LOS: 12 days     

## 2018-01-08 NOTE — Progress Notes (Signed)
    [facility] has been requested to perform a transesophageal echocardiogram on Logan Hancock for bacteremia.  After careful review of history and examination, the risks and benefits of transesophageal echocardiogram have been explained including risks of esophageal damage, perforation (1:10,000 risk), bleeding, pharyngeal hematoma as well as other potential complications associated with conscious sedation including aspiration, arrhythmia, respiratory failure and death. Alternatives to treatment were discussed, questions were answered. Patient is willing to proceed. He had some anxiety about proceeding but did agree to proceed.     Nada BoozerLaura Ingold, NP  01/08/2018 11:34 AM

## 2018-01-08 NOTE — Progress Notes (Signed)
Patient ID: Logan Hancock, male   DOB: 02/29/1990, 27 y.o.   MRN: [id number]  PROGRESS NOTE    Logan Jumboyler K Foulkes  [id number] DOB: 07/15/1990 DOA: 12/27/2017 PCP: Patient, No Pcp Per   Brief Narrative:  27 year old male with history of polysubstance abuse including cocaine, IV heroin and marijuana; bipolar disorder presented with 5 days of right shoulder pain and swelling.  He was found to be febrile in the ED with elevated CRP.  Orthopedics was consulted for probable septic right shoulder.  MRI revealed signs concerning for septic arthritis.  Patient was started on antibiotics.  He was also found to have staph bacteremia, ID was consulted.  12/30/2017: Available records reviewed.  Patient seen.  No new complaints.  No documented fever or chills.  Infectious disease and orthopedic input appreciated.  No chest pain.  No nausea or vomiting.  12/31/2017: No complaints.  No fever or chills.  Continue IV antibiotics.  Infectious disease team.  Is appreciated.  Further management depend on hospital course.  No new complaints.  01/01/2018: Patient seen.  No new complaints.  Repeat blood cultures have not grown any organisms to date.  Infectious disease input is highly appreciated.  01/07/2018: No new complaints.  Complete course of antibiotics.  Timing of the TEE deferred to the infectious disease team.  01/08/2018: No new complaints.  Cardiology team consulted for TEE.  Possible TEE in the morning.   Assessment & Plan:   Active Problems:   Polysubstance abuse (HCC)   Heroin use disorder, severe (HCC)   Sepsis (HCC)   Septic arthritis of shoulder, right (HCC)   Hyponatremia   Infection of shoulder (HCC)  Sepsis secondary to septic joint -Antibiotics plan as below.  Decrease normal saline to 100 cc an hour.  Blood pressure improving.  Monitor 12/30/2017: IV antibiotics as per infectious disease team.  For possible TEE. 12/31/2017: Sepsis physiology has resolved. 01/04/2018: Complete course of  antibiotics.  Defer timing of the TEE to the infectious disease team. 01/08/2018: Possible TEE in a.m.  Cardiology team consulted.  Right acromioclavicular joint septic arthritis -Confirmed by MRI. -Status post I&D on 12/28/2017.  Follow OR cultures; Gram stain showing gram-positive cocci in pairs.   -Initially started on broad-spectrum antibiotics which were switched to IV Ancef by ID on 12/28/2017. 12/30/2017: Complete course of antibiotics.  Leukocytosis -Probably secondary to above.    Acute kidney injury  -Probably secondary to above.  Improving.  Repeat a.m. labs.  IV fluids plan as above. 12/30/2017: Resolved.  Continue to monitor renal function and electrolytes.   Staph bacteremia -BCID suggests MSSA bacteremia.  Follow cultures and sensitivities.  Follow blood cultures from today. -2D echo was negative for any vegetation. 12/30/2017: For TEE. 01/01/2018: Repeat blood cultures have not grown any organisms to date.  Will defer timing of the TEE to the infectious disease.  Polysubstance abuse  -Reports recent use of heroin and methamphetamines -No withdrawal.    -Social worker consult for substance abuse  Thrombocytopenia -Monitor.  No signs of bleeding. 12/30/2017: Resolved.  Hypokalemia -Replace.  Repeat a.m. labs 12/30/2017: Resolved.   Hypomagnesemia -Replace.  Repeat a.m. labs. 12/30/2017: Resolved  Hypo-natremia/hypochloremia -Improved.  Monitor 12/30/2017: Resolved  Bipolar disorder -Patient apparently is no longer on medications for bipolar disorder.  Outpatient follow-up  History of asthma -Albuterol as needed.   DVT prophylaxis: Lovenox Code Status: Full Family Communication: None at bedside Disposition Plan: Depends on clinical outcome  Consultants: Orthopedics.  ID  Procedures: I&D of right  acromioclavicular joint Echo on 12/28/17 Study Conclusions  - Procedure narrative: Transthoracic echocardiography. Image   quality was suboptimal.  The study was technically difficult, as a   result of restricted patient mobility. - Left ventricle: The cavity size was normal. Wall thickness was   normal. Systolic function was normal. The estimated ejection   fraction was in the range of 60% to 65%. Although no diagnostic   regional wall motion abnormality was identified, this possibility   cannot be completely excluded on the basis of this study. The   study is not technically sufficient to allow evaluation of LV   diastolic function. - Aortic valve: Transvalvular velocity was within the normal range.   There was no stenosis. There was no regurgitation. - Mitral valve: There was trivial regurgitation. - Left atrium: The atrium was normal in size. - Right ventricle: The cavity size was normal. Wall thickness was   normal. Systolic function was normal. - Right atrium: The atrium was normal in size. Central venous   pressure (est): 15 mm Hg. - Tricuspid valve: There was trivial regurgitation. - Pulmonic valve: There was trivial regurgitation. - Pulmonary arteries: Systolic pressure could not be accurately   estimated. - Inferior vena cava: The vessel was dilated. The respirophasic   diameter changes were blunted (< 50%). - Pericardium, extracardiac: There was no pericardial effusion.  Impressions:  - There was no evidence of a vegetation.  Recommendations:  Image quality suboptimal for detection of small vegetations. Consider TEE if clinically indicated.   Antimicrobials: Cefepime, Flagyl, vancomycin from 12/27/2017-12/28/17 Cefazolin from 12/28/2017 onwards   Subjective: Patient seen  No fever or chills.   No shortness of breath   Objective: Vitals:   01/07/18 0444 01/07/18 1444 01/07/18 2226 01/08/18 0533  BP: (!) 117/55 132/70 119/68 (!) 142/71  Pulse: 75 83 87 78  Resp: 16 16 16 16   Temp: 97.8 F (36.6 C) 98.6 F (37 C) 98.7 F (37.1 C) 97.7 F (36.5 C)  TempSrc: Oral  Oral   SpO2: 100% 100% 100% 100%    Weight:      Height:        Intake/Output Summary (Last 24 hours) at 01/08/2018 1122 Last data filed at 01/08/2018 0831 Gross per 24 hour  Intake 3280.93 ml  Output -  Net 3280.93 ml   Filed Weights   12/27/17 1407  Weight: 104.3 kg    Examination:  General exam: Awake, alert, and in no obvious distress.  Respiratory system: Clear to auscultation.   Cardiovascular system: S1 & S2 heard. Gastrointestinal system: Abdomen is nondistended, soft and nontender. Normal bowel sounds heard. Extremities: No cyanosis, clubbing, edema.  Right shoulder dressing present    Data Reviewed: I have personally reviewed following labs and imaging studies  CBC: Recent Labs  Lab 01/02/18 0509 01/04/18 0449  WBC 12.2* 13.2*  NEUTROABS 5.7 6.3  HGB 11.5* 12.2*  HCT 36.7* 40.3  MCV 82.7 85.2  PLT 217 243   Basic Metabolic Panel: Recent Labs  Lab 01/02/18 0509 01/04/18 0449  NA 141 141  K 3.7 3.8  CL 106 107  CO2 23 23  GLUCOSE 97 113*  BUN 10 10  CREATININE 0.70 0.72  CALCIUM 8.0* 8.3*  MG 1.8 2.0  PHOS 3.8 4.2   GFR: Estimated Creatinine Clearance: 187.3 mL/min (by C-G formula based on SCr of 0.72 mg/dL). Liver Function Tests: Recent Labs  Lab 01/02/18 0509 01/04/18 0449  ALBUMIN 2.6* 2.9*   No results for input(s): LIPASE, AMYLASE  in the last 168 hours. No results for input(s): AMMONIA in the last 168 hours. Coagulation Profile: No results for input(s): INR, PROTIME in the last 168 hours. Cardiac Enzymes: No results for input(s): CKTOTAL, CKMB, CKMBINDEX, TROPONINI in the last 168 hours. BNP (last 3 results) No results for input(s): PROBNP in the last 8760 hours. HbA1C: No results for input(s): HGBA1C in the last 72 hours. CBG: No results for input(s): GLUCAP in the last 168 hours. Lipid Profile: No results for input(s): CHOL, HDL, LDLCALC, TRIG, CHOLHDL, LDLDIRECT in the last 72 hours. Thyroid Function Tests: No results for input(s): TSH, T4TOTAL, FREET4,  T3FREE, THYROIDAB in the last 72 hours. Anemia Panel: No results for input(s): VITAMINB12, FOLATE, FERRITIN, TIBC, IRON, RETICCTPCT in the last 72 hours. Sepsis Labs: No results for input(s): PROCALCITON, LATICACIDVEN in the last 168 hours.  No results found for this or any previous visit (from the past 240 hour(s)).       Radiology Studies: No results found.      Scheduled Meds: . buPROPion  300 mg Oral Daily  . docusate sodium  100 mg Oral BID  . enoxaparin (LOVENOX) injection  40 mg Subcutaneous QHS  .  HYDROmorphone (DILAUDID) injection  0.5 mg Intravenous Once  . mirtazapine  15 mg Oral QHS  . nicotine  21 mg Transdermal Daily  . sodium chloride flush  3 mL Intravenous Q12H   Continuous Infusions: . sodium chloride 100 mL/hr at 01/07/18 1517  .  ceFAZolin (ANCEF) IV 2 g (01/08/18 0939)  . methocarbamol (ROBAXIN) IV Stopped (12/28/17 1154)     LOS: 12 days   Time spent - 25 Minutes  Barnetta ChapelSylvester I Levon Penning, MD [facility] Pager [phone] If 7PM-7AM, please contact night-coverage [email or web address] Password [email or web address] 01/08/2018, 11:22 AM

## 2018-01-08 NOTE — Progress Notes (Signed)
   INFECTIOUS DISEASE PROGRESS NOTE  ID: Logan Hancock is a 27 y.o. male with  Active Problems:   Polysubstance abuse (HCC)   Heroin use disorder, severe (HCC)   Sepsis (HCC)   Septic arthritis of shoulder, right (HCC)   Hyponatremia   Infection of shoulder (HCC)  Subjective: No complaints  Abtx:  Anti-infectives (From admission, onward)   Start     Dose/Rate Route Frequency Ordered Stop   12/28/17 1600  ceFAZolin (ANCEF) IVPB 2g/100 mL premix     2 g 200 mL/hr over 30 Minutes Intravenous Every 8 hours 12/28/17 1540     12/28/17 0200  ceFEPIme (MAXIPIME) 2 g in sodium chloride 0.9 % 100 mL IVPB  Status:  Discontinued     2 g 200 mL/hr over 30 Minutes Intravenous Every 8 hours 12/27/17 2022 12/28/17 1540   12/27/17 2200  metroNIDAZOLE (FLAGYL) IVPB 500 mg  Status:  Discontinued     500 mg 100 mL/hr over 60 Minutes Intravenous Every 8 hours 12/27/17 2000 12/28/17 1540   12/27/17 1745  vancomycin (VANCOCIN) 2,000 mg in sodium chloride 0.9 % 500 mL IVPB     2,000 mg 250 mL/hr over 120 Minutes Intravenous  Once 12/27/17 1740 12/27/17 2259   12/27/17 1745  ceFEPIme (MAXIPIME) 2 g in sodium chloride 0.9 % 100 mL IVPB     2 g 200 mL/hr over 30 Minutes Intravenous  Once 12/27/17 1740 12/27/17 1840      Medications:  Scheduled: . buPROPion  300 mg Oral Daily  . docusate sodium  100 mg Oral BID  . enoxaparin (LOVENOX) injection  40 mg Subcutaneous QHS  .  HYDROmorphone (DILAUDID) injection  0.5 mg Intravenous Once  . mirtazapine  15 mg Oral QHS  . nicotine  21 mg Transdermal Daily  . sodium chloride flush  3 mL Intravenous Q12H    Objective: Vital signs in last 24 hours: Temp:  [97.7 F (36.5 C)-98.7 F (37.1 C)] 98.3 F (36.8 C) (12/30 1306) Pulse Rate:  [76-87] 76 (12/30 1306) Resp:  [14-16] 14 (12/30 1306) BP: (119-176)/(68-83) 176/83 (12/30 1306) SpO2:  [100 %] 100 % (12/30 1306)   General appearance: alert, cooperative and no distress Resp: clear to auscultation  bilaterally Cardio: regular rate and rhythm GI: normal findings: bowel sounds normal and soft, non-tender  Lab Results No results for input(s): WBC, HGB, HCT, NA, K, CL, CO2, BUN, CREATININE, GLU in the last 72 hours.  Invalid input(s): PLATELETS Liver Panel No results for input(s): PROT, ALBUMIN, AST, ALT, ALKPHOS, BILITOT, BILIDIR, IBILI in the last 72 hours. Sedimentation Rate No results for input(s): ESRSEDRATE in the last 72 hours. C-Reactive Protein No results for input(s): CRP in the last 72 hours.  Microbiology: No results found for this or any previous visit (from the past 240 hour(s)).  Studies/Results: No results found.   Assessment/Plan: IVDA L shoulder septic arthritis (MSSA) MSSA bacteremia Hep C Ab+  Total days of antibiotics: 11 ancef  Will check Hep C RNA, genotype For TEE in AM to help determine length of therapy Repeat BCx 12-20 ngtd referral to [facility] suboxonne clinic as outpt.          Logan SaxJeffrey Hatcher MD, FACP Infectious Diseases (pager) [phone] [email or web address] 01/08/2018, 5:47 PM  LOS: 12 days

## 2018-01-09 ENCOUNTER — Inpatient Hospital Stay (HOSPITAL_COMMUNITY): Payer: Self-pay | Admitting: Anesthesiology

## 2018-01-09 ENCOUNTER — Inpatient Hospital Stay (HOSPITAL_COMMUNITY): Payer: Self-pay

## 2018-01-09 ENCOUNTER — Encounter (HOSPITAL_COMMUNITY): Payer: Self-pay

## 2018-01-09 ENCOUNTER — Encounter (HOSPITAL_COMMUNITY)
Admission: EM | Disposition: A | Discharge status: Home / Self Care | Payer: Self-pay | Source: Home / Self Care | Attending: Internal Medicine

## 2018-01-09 DIAGNOSIS — R7881 Bacteremia: Secondary | ICD-10-CM

## 2018-01-09 DIAGNOSIS — F1110 Opioid abuse, uncomplicated: Secondary | ICD-10-CM

## 2018-01-09 HISTORY — PX: TEE WITHOUT CARDIOVERSION: SHX5443

## 2018-01-09 SURGERY — ECHOCARDIOGRAM, TRANSESOPHAGEAL
Anesthesia: Monitor Anesthesia Care

## 2018-01-09 MED ORDER — PROPOFOL 10 MG/ML IV BOLUS
INTRAVENOUS | Status: DC | PRN
  Administered 2018-01-09: 20 mg via INTRAVENOUS
  Administered 2018-01-09 (×2): 50 mg via INTRAVENOUS
  Administered 2018-01-09: 20 mg via INTRAVENOUS
  Administered 2018-01-09 (×2): 30 mg via INTRAVENOUS

## 2018-01-09 MED ORDER — LIDOCAINE 2% (20 MG/ML) 5 ML SYRINGE
INTRAMUSCULAR | Status: DC | PRN
  Administered 2018-01-09: 100 mg via INTRAVENOUS

## 2018-01-09 MED ORDER — SODIUM CHLORIDE 0.9 % IV SOLN: INTRAVENOUS | Status: DC

## 2018-01-09 MED ORDER — IBUPROFEN 400 MG PO TABS
400.0000 mg | ORAL_TABLET | Freq: Four times a day (QID) | ORAL | Status: DC | PRN
  Administered 2018-01-09 – 2018-01-10 (×3): 400 mg via ORAL
  Filled 2018-01-09 (×3): qty 1

## 2018-01-09 MED ORDER — LACTATED RINGERS IV SOLN
INTRAVENOUS | Status: DC | PRN
  Administered 2018-01-09: 13:00:00 via INTRAVENOUS

## 2018-01-09 MED ORDER — PROPOFOL 500 MG/50ML IV EMUL
INTRAVENOUS | Status: DC | PRN
  Administered 2018-01-09: 80 ug/kg/min via INTRAVENOUS

## 2018-01-09 MED ORDER — HYDROMORPHONE HCL 1 MG/ML IJ SOLN
INTRAMUSCULAR | Status: AC
  Filled 2018-01-09: qty 1

## 2018-01-09 MED ORDER — LACTATED RINGERS IV SOLN
INTRAVENOUS | Status: DC
  Administered 2018-01-09: 11:00:00 via INTRAVENOUS

## 2018-01-09 NOTE — Transfer of Care (Signed)
Immediate Anesthesia Transfer of Care Note  Patient: Logan Hancock  Procedure(s) Performed: TRANSESOPHAGEAL ECHOCARDIOGRAM (TEE) (N/A )  Patient Location: Endoscopy Unit  Anesthesia Type:MAC  Level of Consciousness: drowsy  Airway & Oxygen Therapy: Patient Spontanous Breathing and Patient connected to nasal cannula oxygen  Post-op Assessment: Report given to RN and Post -op Vital signs reviewed and stable  Post vital signs: Reviewed and stable  Last Vitals:  Vitals Value Taken Time  BP 108/44 01/09/2018  1:40 PM  Temp    Pulse 93 01/09/2018  1:40 PM  Resp 17 01/09/2018  1:40 PM  SpO2 98 % 01/09/2018  1:40 PM  Vitals shown include unvalidated device data.  Last Pain:  Vitals:   01/09/18 1340  TempSrc:   PainSc: 0-No pain      Patients Stated Pain Goal: 4 (70/26/37 8588)  Complications: No apparent anesthesia complications

## 2018-01-09 NOTE — Progress Notes (Signed)
Informed patient that a urine sample was needed and asked patient to urinate in a provided urinal. Patient refused, and stated he would not be giving a sample at this time.   Will continue to educate, and try to obtain specimen.

## 2018-01-09 NOTE — Anesthesia Postprocedure Evaluation (Signed)
Anesthesia Post Note  Patient: Logan Hancock  Procedure(s) Performed: TRANSESOPHAGEAL ECHOCARDIOGRAM (TEE) (N/A )     Patient location during evaluation: Endoscopy Anesthesia Type: MAC Level of consciousness: awake and alert Pain management: pain level controlled Vital Signs Assessment: post-procedure vital signs reviewed and stable Respiratory status: spontaneous breathing, nonlabored ventilation and respiratory function stable Cardiovascular status: blood pressure returned to baseline and stable Postop Assessment: no apparent nausea or vomiting Anesthetic complications: no    Last Vitals:  Vitals:   01/09/18 1410 01/09/18 1420  BP: 123/69 (!) 145/63  Pulse: 77 88  Resp: 13 15  Temp:    SpO2: 97% 100%    Last Pain:  Vitals:   01/09/18 1350  TempSrc: Oral  PainSc: 0-No pain                 Lidia Collum

## 2018-01-09 NOTE — Progress Notes (Signed)
Patient ID: Logan Hancock, male   DOB: 09/21/1990, 27 y.o.   MRN: [id number]  PROGRESS NOTE    Logan Hancock  [id number] DOB: 11/20/1990 DOA: 12/27/2017 PCP: Patient, No Pcp Per   Brief Narrative:  27 year old male with history of polysubstance abuse including cocaine, IV heroin and marijuana; bipolar disorder presented with 5 days of right shoulder pain and swelling.  He was found to be febrile in the ED with elevated CRP.  Orthopedics was consulted for probable septic right shoulder.  MRI revealed signs concerning for septic arthritis.  Patient was started on antibiotics.  He was also found to have staph bacteremia, ID was consulted.  Patient underwent I and D for right acromioclavicular joint septic arthritis on 1219.  Patient was also found to have staph bacteremia.  He had a TEE 12-31 which was negative for endocarditis.  Awaiting recommendation from ID for antibiotics length   Assessment & Plan:   Active Problems:   Polysubstance abuse (HCC)   Heroin use disorder, severe (HCC)   Sepsis (HCC)   Septic arthritis of shoulder, right (HCC)   Hyponatremia   Infection of shoulder (HCC)  Sepsis secondary to septic joint 12/30/2017: IV antibiotics as per infectious disease team.  Sepsis physiology has resolved. Monitor white count.  Right acromioclavicular joint septic arthritis -Confirmed by MRI. -Status post I&D on 12/28/2017.  Follow OR cultures; Gram stain showing gram-positive cocci in pairs.   -Initially started on broad-spectrum antibiotics which were switched to IV Ancef by ID on 12/28/2017. -Start ibuprofen as needed for pain.  Will need to start decreasing IV Dilaudid.  Leukocytosis; -Probably secondary to above.    Acute kidney injury ; -Probably secondary to above.  Improving.  Repeat a.m. labs.  IV fluids plan as above. -Resolved.  Continue to monitor renal function and electrolytes.   Staph bacteremia -BCID suggests MSSA bacteremia.  -2D echo was negative for  any vegetation. 01/01/2018: Repeat blood cultures have not grown any organisms to date.   -TEE negative. Follow recommendation from ID for length of treatment  Polysubstance abuse  -Reports recent use of heroin and methamphetamines -No withdrawal.    -Social worker consult for substance abuse  Thrombocytopenia -Monitor.  No signs of bleeding. 12/30/2017: Resolved.  Hypokalemia Resolved  Hypomagnesemia Resolved   Hypo-natremia/hypochloremia -Improved.  Monitor 12/30/2017: Resolved  Bipolar disorder -Patient apparently is no longer on medications for bipolar disorder.  Outpatient follow-up  History of asthma -Albuterol as needed.   DVT prophylaxis: Lovenox Code Status: Full Family Communication: None at bedside Disposition Plan: Depends on clinical outcome  Consultants: Orthopedics.  ID  Procedures: I&D of right acromioclavicular joint Echo on 12/28/17 Study Conclusions  - Procedure narrative: Transthoracic echocardiography. Image   quality was suboptimal. The study was technically difficult, as a   result of restricted patient mobility. - Left ventricle: The cavity size was normal. Wall thickness was   normal. Systolic function was normal. The estimated ejection   fraction was in the range of 60% to 65%. Although no diagnostic   regional wall motion abnormality was identified, this possibility   cannot be completely excluded on the basis of this study. The   study is not technically sufficient to allow evaluation of LV   diastolic function. - Aortic valve: Transvalvular velocity was within the normal range.   There was no stenosis. There was no regurgitation. - Mitral valve: There was trivial regurgitation. - Left atrium: The atrium was normal in size. - Right ventricle: The cavity  size was normal. Wall thickness was   normal. Systolic function was normal. - Right atrium: The atrium was normal in size. Central venous   pressure (est): 15 mm Hg. - Tricuspid  valve: There was trivial regurgitation. - Pulmonic valve: There was trivial regurgitation. - Pulmonary arteries: Systolic pressure could not be accurately   estimated. - Inferior vena cava: The vessel was dilated. The respirophasic   diameter changes were blunted (< 50%). - Pericardium, extracardiac: There was no pericardial effusion.  Impressions:  - There was no evidence of a vegetation.  Recommendations:  Image quality suboptimal for detection of small vegetations. Consider TEE if clinically indicated.   Antimicrobials: Cefepime, Flagyl, vancomycin from 12/27/2017-12/28/17 Cefazolin from 12/28/2017 onwards   Subjective: He still complaining of shoulder pain   Objective: Vitals:   01/09/18 1400 01/09/18 1410 01/09/18 1420 01/09/18 1430  BP: 125/74 123/69 (!) 145/63 132/63  Pulse: 78 77 88 76  Resp: 19 13 15 13   Temp:      TempSrc:      SpO2: 100% 97% 100% 100%  Weight:      Height:        Intake/Output Summary (Last 24 hours) at 01/09/2018 1629 Last data filed at 01/09/2018 1340 Gross per 24 hour  Intake 2979.7 ml  Output 0 ml  Net 2979.7 ml   Filed Weights   12/27/17 1407 01/09/18 1137  Weight: 104.3 kg 104.3 kg    Examination:  General exam: NAD Respiratory system: CTA Cardiovascular system: S 1, S 2 RRR Gastrointestinal system: BS present, soft, nt. Extremities: right shoulder with dressing.     Data Reviewed: I have personally reviewed following labs and imaging studies  CBC: Recent Labs  Lab 01/04/18 0449  WBC 13.2*  NEUTROABS 6.3  HGB 12.2*  HCT 40.3  MCV 85.2  PLT 243   Basic Metabolic Panel: Recent Labs  Lab 01/04/18 0449  NA 141  K 3.8  CL 107  CO2 23  GLUCOSE 113*  BUN 10  CREATININE 0.72  CALCIUM 8.3*  MG 2.0  PHOS 4.2   GFR: Estimated Creatinine Clearance: 187.3 mL/min (by C-G formula based on SCr of 0.72 mg/dL). Liver Function Tests: Recent Labs  Lab 01/04/18 0449  ALBUMIN 2.9*   No results for input(s):  LIPASE, AMYLASE in the last 168 hours. No results for input(s): AMMONIA in the last 168 hours. Coagulation Profile: No results for input(s): INR, PROTIME in the last 168 hours. Cardiac Enzymes: No results for input(s): CKTOTAL, CKMB, CKMBINDEX, TROPONINI in the last 168 hours. BNP (last 3 results) No results for input(s): PROBNP in the last 8760 hours. HbA1C: No results for input(s): HGBA1C in the last 72 hours. CBG: No results for input(s): GLUCAP in the last 168 hours. Lipid Profile: No results for input(s): CHOL, HDL, LDLCALC, TRIG, CHOLHDL, LDLDIRECT in the last 72 hours. Thyroid Function Tests: No results for input(s): TSH, T4TOTAL, FREET4, T3FREE, THYROIDAB in the last 72 hours. Anemia Panel: No results for input(s): VITAMINB12, FOLATE, FERRITIN, TIBC, IRON, RETICCTPCT in the last 72 hours. Sepsis Labs: No results for input(s): PROCALCITON, LATICACIDVEN in the last 168 hours.  No results found for this or any previous visit (from the past 240 hour(s)).       Radiology Studies: No results found.      Scheduled Meds: . buPROPion  300 mg Oral Daily  . docusate sodium  100 mg Oral BID  . enoxaparin (LOVENOX) injection  40 mg Subcutaneous QHS  .  HYDROmorphone (DILAUDID)  injection  0.5 mg Intravenous Once  . mirtazapine  15 mg Oral QHS  . nicotine  21 mg Transdermal Daily  . sodium chloride flush  3 mL Intravenous Q12H   Continuous Infusions: . sodium chloride Stopped (01/09/18 1045)  .  ceFAZolin (ANCEF) IV 2 g (01/09/18 1008)  . methocarbamol (ROBAXIN) IV Stopped (12/28/17 1154)     LOS: 13 days   Time spent - 25 Minutes  Alba CoryBelkys A Albie Bazin, MD [facility] Pager [phone] If 7PM-7AM, please contact night-coverage [email or web address] Password [email or web address] 01/09/2018, 4:29 PM

## 2018-01-09 NOTE — CV Procedure (Signed)
   Transesophageal Echocardiogram  Indications: Bacteremia  Time out performed Anesthesia present, propofol.  Findings:  Left Ventricle: Normal, 60% EF   Mitral Valve: Trace MR  Aortic Valve: Normal  Tricuspid Valve: Normal  Left Atrium: Normal  Impression: NO ENDOCARDITIS  Donato SchultzMark Caitlynne Harbeck, MD

## 2018-01-09 NOTE — Interval H&P Note (Signed)
History and Physical Interval Note:  01/09/2018 11:26 AM  Logan Hancock  has presented today for surgery, with the diagnosis of BACTEREMIA  The various methods of treatment have been discussed with the patient and family. After consideration of risks, benefits and other options for treatment, the patient has consented to  Procedure(s) with comments: TRANSESOPHAGEAL ECHOCARDIOGRAM (TEE) (N/A) - with anesthesia as a surgical intervention .  The patient's history has been reviewed, patient examined, no change in status, stable for surgery.  I have reviewed the patient's chart and labs.  Questions were answered to the patient's satisfaction.     Coca ColaMark Skains

## 2018-01-09 NOTE — Progress Notes (Addendum)
INFECTIOUS DISEASE PROGRESS NOTE  ID: Logan Hancock is a 27 y.o. male with  Active Problems:   Polysubstance abuse (HCC)   Heroin use disorder, severe (HCC)   Sepsis (HCC)   Septic arthritis of shoulder, right (HCC)   Hyponatremia   Infection of shoulder (HCC)  Subjective: Still some shoulder pain.   Abtx:  Anti-infectives (From admission, onward)   Start     Dose/Rate Route Frequency Ordered Stop   12/28/17 1600  ceFAZolin (ANCEF) IVPB 2g/100 mL premix     2 g 200 mL/hr over 30 Minutes Intravenous Every 8 hours 12/28/17 1540     12/28/17 0200  ceFEPIme (MAXIPIME) 2 g in sodium chloride 0.9 % 100 mL IVPB  Status:  Discontinued     2 g 200 mL/hr over 30 Minutes Intravenous Every 8 hours 12/27/17 2022 12/28/17 1540   12/27/17 2200  metroNIDAZOLE (FLAGYL) IVPB 500 mg  Status:  Discontinued     500 mg 100 mL/hr over 60 Minutes Intravenous Every 8 hours 12/27/17 2000 12/28/17 1540   12/27/17 1745  vancomycin (VANCOCIN) 2,000 mg in sodium chloride 0.9 % 500 mL IVPB     2,000 mg 250 mL/hr over 120 Minutes Intravenous  Once 12/27/17 1740 12/27/17 2259   12/27/17 1745  ceFEPIme (MAXIPIME) 2 g in sodium chloride 0.9 % 100 mL IVPB     2 g 200 mL/hr over 30 Minutes Intravenous  Once 12/27/17 1740 12/27/17 1840      Medications:  Scheduled: . buPROPion  300 mg Oral Daily  . docusate sodium  100 mg Oral BID  . enoxaparin (LOVENOX) injection  40 mg Subcutaneous QHS  .  HYDROmorphone (DILAUDID) injection  0.5 mg Intravenous Once  . mirtazapine  15 mg Oral QHS  . nicotine  21 mg Transdermal Daily  . sodium chloride flush  3 mL Intravenous Q12H    Objective: Vital signs in last 24 hours: Temp:  [98.1 F (36.7 C)-98.6 F (37 C)] 98.3 F (36.8 C) (12/31 1350) Pulse Rate:  [76-95] 76 (12/31 1430) Resp:  [12-19] 13 (12/31 1430) BP: (108-155)/(44-93) 132/63 (12/31 1430) SpO2:  [97 %-100 %] 100 % (12/31 1430) Weight:  [104.3 kg] 104.3 kg (12/31 1137)   General appearance:  alert, cooperative and no distress Resp: clear to auscultation bilaterally Cardio: regular rate and rhythm GI: normal findings: bowel sounds normal and soft, non-tender Extremities: mild-mod tenderness R shoulder.   Lab Results No results for input(s): WBC, HGB, HCT, NA, K, CL, CO2, BUN, CREATININE, GLU in the last 72 hours.  Invalid input(s): PLATELETS Liver Panel No results for input(s): PROT, ALBUMIN, AST, ALT, ALKPHOS, BILITOT, BILIDIR, IBILI in the last 72 hours. Sedimentation Rate No results for input(s): ESRSEDRATE in the last 72 hours. C-Reactive Protein No results for input(s): CRP in the last 72 hours.  Microbiology: No results found for this or any previous visit (from the past 240 hour(s)).  Studies/Results: No results found.   Assessment/Plan: IVDA L shoulder septic arthritis (MSSA) MSSA bacteremia Hep C Ab+  Total days of antibiotics: 12 ancef  TEE (-) Would give him 2 more days of ancef and then 1 month of keflex 500mg  po qid.  He can f/u in ID clinic in 3 weeks (please make appt) to monitor his infection and to f/u on his Hep C testing.  Please schedule f/u in IMTS for his opioid use d/o.  Explained to pt.  Available as needed  Johny SaxJeffrey Talea Manges MD, FACP Infectious Diseases (pager) [phone] [email or web address] 01/09/2018, 4:57 PM  LOS: 13 days

## 2018-01-09 NOTE — Progress Notes (Signed)
  Echocardiogram Echocardiogram Transesophageal has been performed.  Logan SkeenVijay  Logan Hancock 01/09/2018, 1:41 PM

## 2018-01-09 NOTE — Anesthesia Preprocedure Evaluation (Signed)
Anesthesia Evaluation  Patient identified by MRN, date of birth, ID band Patient awake    Reviewed: Allergy & Precautions, NPO status , Patient's Chart, lab work & pertinent test results  History of Anesthesia Complications Negative for: history of anesthetic complications  Airway Mallampati: II  TM Distance: >3 FB Neck ROM: Full    Dental  (+) Teeth Intact   Pulmonary neg pulmonary ROS, Current Smoker,    Pulmonary exam normal        Cardiovascular negative cardio ROS Normal cardiovascular exam     Neuro/Psych PSYCHIATRIC DISORDERS Depression Bipolar Disorder negative neurological ROS     GI/Hepatic negative GI ROS, (+)     substance abuse  cocaine use, marijuana use and IV drug use, Hepatitis -, C  Endo/Other  negative endocrine ROS  Renal/GU negative Renal ROS  negative genitourinary   Musculoskeletal  (+) Arthritis  (septic), narcotic dependent  Abdominal   Peds  Hematology negative hematology ROS (+)   Anesthesia Other Findings   Reproductive/Obstetrics                            Anesthesia Physical Anesthesia Plan  ASA: III  Anesthesia Plan: MAC   Post-op Pain Management:    Induction:   PONV Risk Score and Plan: 0 and Propofol infusion and Treatment may vary due to age or medical condition  Airway Management Planned: Nasal Cannula and Simple Face Mask  Additional Equipment: None  Intra-op Plan:   Post-operative Plan:   Informed Consent: I have reviewed the patients History and Physical, chart, labs and discussed the procedure including the risks, benefits and alternatives for the proposed anesthesia with the patient or authorized representative who has indicated his/her understanding and acceptance.     Plan Discussed with:   Anesthesia Plan Comments:         Anesthesia Quick Evaluation

## 2018-01-10 ENCOUNTER — Encounter (HOSPITAL_COMMUNITY): Payer: Self-pay | Admitting: Cardiology

## 2018-01-10 LAB — BASIC METABOLIC PANEL
Anion gap: 9 (ref 5–15)
BUN: 15 mg/dL (ref 6–20)
CO2: 26 mmol/L (ref 22–32)
Calcium: 8.6 mg/dL — ABNORMAL LOW (ref 8.9–10.3)
Chloride: 105 mmol/L (ref 98–111)
Creatinine, Ser: 0.73 mg/dL (ref 0.61–1.24)
GFR calc Af Amer: >60 mL/min (ref >60)
GFR calc non Af Amer: >60 mL/min (ref >60)
Glucose, Bld: 88 mg/dL (ref 70–99)
Potassium: 4 mmol/L (ref 3.5–5.1)
Sodium: 140 mmol/L (ref 135–145)

## 2018-01-10 LAB — CBC
HCT: 40.6 % (ref 39.0–52.0)
Hemoglobin: 12.4 g/dL — ABNORMAL LOW (ref 13.0–17.0)
MCH: 26.2 pg (ref 26.0–34.0)
MCHC: 30.5 g/dL (ref 30.0–36.0)
MCV: 85.8 fL (ref 80.0–100.0)
Platelets: 345 10*3/uL (ref 150–400)
RBC: 4.73 MIL/uL (ref 4.22–5.81)
RDW: 15.1 % (ref 11.5–15.5)
WBC: 7.8 10*3/uL (ref 4.0–10.5)
nRBC: 0 % (ref 0.0–0.2)

## 2018-01-10 MED ORDER — HYDROMORPHONE HCL 1 MG/ML IJ SOLN
0.5000 mg | Freq: Four times a day (QID) | INTRAMUSCULAR | Status: DC | PRN
  Administered 2018-01-10 – 2018-01-11 (×3): 1 mg via INTRAVENOUS
  Filled 2018-01-10 (×4): qty 1

## 2018-01-10 NOTE — Plan of Care (Signed)
  Problem: Clinical Measurements: Goal: Ability to maintain clinical measurements within normal limits will improve Outcome: Progressing   Problem: Clinical Measurements: Goal: Signs and symptoms of infection will decrease Outcome: Progressing

## 2018-01-10 NOTE — Progress Notes (Signed)
Patient ID: Logan Hancock, male   DOB: [birth year], 28 y.o.   MRN: [id number]  PROGRESS NOTE    OSBORN HOGLEN  DHR:[id number] DOB: 18-Sep-1990 DOA: 12/27/2017 PCP: Patient, No Pcp Per   Brief Narrative:  28 year old male with history of polysubstance abuse including cocaine, IV heroin and marijuana; bipolar disorder presented with 5 days of right shoulder pain and swelling.  He was found to be febrile in the ED with elevated CRP.  Orthopedics was consulted for probable septic right shoulder.  MRI revealed signs concerning for septic arthritis.  Patient was started on antibiotics.  He was also found to have staph bacteremia, ID was consulted.  Patient underwent I and D for right acromioclavicular joint septic arthritis on 1219.  Patient was also found to have staph bacteremia.  He had a TEE 12-31 which was negative for endocarditis.  Awaiting recommendation from ID for antibiotics length   Assessment & Plan:   Active Problems:   Polysubstance abuse (HCC)   Heroin use disorder, severe (HCC)   Sepsis (HCC)   Septic arthritis of shoulder, right (HCC)   Hyponatremia   Infection of shoulder (HCC)  Sepsis secondary to septic joint 12/30/2017: IV antibiotics as per infectious disease team.  Sepsis physiology has resolved. WBC normalized.   Right acromioclavicular joint septic arthritis -Confirmed by MRI. -Status post I&D on 12/28/2017.  Follow OR cultures; Gram stain showing gram-positive cocci in pairs.   -Initially started on broad-spectrum antibiotics which were switched to IV Ancef by ID on 12/28/2017. -Started  ibuprofen as needed for pain.  Will need to start decreasing IV Dilaudid.  Leukocytosis; -Probably secondary to above.    Acute kidney injury ; -Probably secondary to above.  Improving.  Repeat a.m. labs.  IV fluids plan as above. -Resolved.  Continue to monitor renal function and electrolytes.   Staph bacteremia -BCID suggests MSSA bacteremia.  -2D echo was negative for  any vegetation. 01/01/2018: Repeat blood cultures have not grown any organisms to date.   -TEE negative. ID recommending 2 days more of IV antibiotics, then discharge on keflex QID for one month.    Polysubstance abuse  -Reports recent use of heroin and methamphetamines -No withdrawal.    -Social worker consult for substance abuse Will try to refer him to IMTS for opiod use.   Thrombocytopenia -Monitor.  No signs of bleeding. 12/30/2017: Resolved.  Hypokalemia Resolved  Hypomagnesemia Resolved   Hypo-natremia/hypochloremia -Improved.  Monitor 12/30/2017: Resolved  Bipolar disorder -Patient apparently is no longer on medications for bipolar disorder.  Outpatient follow-up  History of asthma -Albuterol as needed.   DVT prophylaxis: Lovenox Code Status: Full Family Communication: None at bedside Disposition Plan: Depends on clinical outcome  Consultants: Orthopedics.  ID  Procedures: I&D of right acromioclavicular joint Echo on 12/28/17 Study Conclusions  - Procedure narrative: Transthoracic echocardiography. Image   quality was suboptimal. The study was technically difficult, as a   result of restricted patient mobility. - Left ventricle: The cavity size was normal. Wall thickness was   normal. Systolic function was normal. The estimated ejection   fraction was in the range of 60% to 65%. Although no diagnostic   regional wall motion abnormality was identified, this possibility   cannot be completely excluded on the basis of this study. The   study is not technically sufficient to allow evaluation of LV   diastolic function. - Aortic valve: Transvalvular velocity was within the normal range.   There was no stenosis. There was no  regurgitation. - Mitral valve: There was trivial regurgitation. - Left atrium: The atrium was normal in size. - Right ventricle: The cavity size was normal. Wall thickness was   normal. Systolic function was normal. - Right atrium:  The atrium was normal in size. Central venous   pressure (est): 15 mm Hg. - Tricuspid valve: There was trivial regurgitation. - Pulmonic valve: There was trivial regurgitation. - Pulmonary arteries: Systolic pressure could not be accurately   estimated. - Inferior vena cava: The vessel was dilated. The respirophasic   diameter changes were blunted (< 50%). - Pericardium, extracardiac: There was no pericardial effusion.  Impressions:  - There was no evidence of a vegetation.  Recommendations:  Image quality suboptimal for detection of small vegetations. Consider TEE if clinically indicated.   Antimicrobials: Cefepime, Flagyl, vancomycin from 12/27/2017-12/28/17 Cefazolin from 12/28/2017 onwards   Subjective: Pain controled. He will need assistance with medications and transportation for appointment.   Objective: Vitals:   01/09/18 1430 01/09/18 2215 01/10/18 0543 01/10/18 1322  BP: 132/63 (!) 145/66 140/74 (!) 151/92  Pulse: 76 81 76 (!) 103  Resp: 13 17 16 18   Temp:  98.4 F (36.9 C) 97.8 F (36.6 C) 98.1 F (36.7 C)  TempSrc:  Oral Oral Oral  SpO2: 100% 99% 100% 98%  Weight:      Height:        Intake/Output Summary (Last 24 hours) at 01/10/2018 1520 Last data filed at 01/10/2018 1054 Gross per 24 hour  Intake 2140.84 ml  Output 0 ml  Net 2140.84 ml   Filed Weights   12/27/17 1407 01/09/18 1137  Weight: 104.3 kg 104.3 kg    Examination:  General exam: NAD Respiratory system: CTA Cardiovascular system:S 1, S 2 RRR Gastrointestinal system: BS present, soft, nt Extremities: right shoulder with dressing    Data Reviewed: I have personally reviewed following labs and imaging studies  CBC: Recent Labs  Lab 01/04/18 0449 01/10/18 0730  WBC 13.2* 7.8  NEUTROABS 6.3  --   HGB 12.2* 12.4*  HCT 40.3 40.6  MCV 85.2 85.8  PLT 243 345   Basic Metabolic Panel: Recent Labs  Lab 01/04/18 0449 01/10/18 0730  NA 141 140  K 3.8 4.0  CL 107 105  CO2 23  26  GLUCOSE 113* 88  BUN 10 15  CREATININE 0.72 0.73  CALCIUM 8.3* 8.6*  MG 2.0  --   PHOS 4.2  --    GFR: Estimated Creatinine Clearance: 187.3 mL/min (by C-G formula based on SCr of 0.73 mg/dL). Liver Function Tests: Recent Labs  Lab 01/04/18 0449  ALBUMIN 2.9*   No results for input(s): LIPASE, AMYLASE in the last 168 hours. No results for input(s): AMMONIA in the last 168 hours. Coagulation Profile: No results for input(s): INR, PROTIME in the last 168 hours. Cardiac Enzymes: No results for input(s): CKTOTAL, CKMB, CKMBINDEX, TROPONINI in the last 168 hours. BNP (last 3 results) No results for input(s): PROBNP in the last 8760 hours. HbA1C: No results for input(s): HGBA1C in the last 72 hours. CBG: No results for input(s): GLUCAP in the last 168 hours. Lipid Profile: No results for input(s): CHOL, HDL, LDLCALC, TRIG, CHOLHDL, LDLDIRECT in the last 72 hours. Thyroid Function Tests: No results for input(s): TSH, T4TOTAL, FREET4, T3FREE, THYROIDAB in the last 72 hours. Anemia Panel: No results for input(s): VITAMINB12, FOLATE, FERRITIN, TIBC, IRON, RETICCTPCT in the last 72 hours. Sepsis Labs: No results for input(s): PROCALCITON, LATICACIDVEN in the last 168 hours.  No results found for this or any previous visit (from the past 240 hour(s)).       Radiology Studies: No results found.      Scheduled Meds: . buPROPion  300 mg Oral Daily  . docusate sodium  100 mg Oral BID  . enoxaparin (LOVENOX) injection  40 mg Subcutaneous QHS  .  HYDROmorphone (DILAUDID) injection  0.5 mg Intravenous Once  . mirtazapine  15 mg Oral QHS  . nicotine  21 mg Transdermal Daily  . sodium chloride flush  3 mL Intravenous Q12H   Continuous Infusions: . sodium chloride 100 mL/hr at 01/10/18 0809  .  ceFAZolin (ANCEF) IV 2 g (01/10/18 0805)  . methocarbamol (ROBAXIN) IV Stopped (12/28/17 1154)     LOS: 14 days   Time spent - 25 Minutes  Alba Cory, MD [facility] Pager [phone] If 7PM-7AM, please contact night-coverage [email or web address] Password [email or web address] 01/10/2018, 3:20 PM

## 2018-01-11 DIAGNOSIS — A419 Sepsis, unspecified organism: Secondary | ICD-10-CM

## 2018-01-11 LAB — HEPATITIS C VRS RNA DETECT BY PCR-QUAL: Hepatitis C Vrs RNA by PCR-Qual: NEGATIVE

## 2018-01-11 MED ORDER — ALBUTEROL SULFATE HFA 108 (90 BASE) MCG/ACT IN AERS: 1.0000 | INHALATION_SPRAY | Freq: Four times a day (QID) | RESPIRATORY_TRACT | 0 refills | Status: DC | PRN

## 2018-01-11 MED ORDER — IBUPROFEN 400 MG PO TABS: 400.0000 mg | ORAL_TABLET | Freq: Four times a day (QID) | ORAL | Status: DC | PRN

## 2018-01-11 MED ORDER — MIRTAZAPINE 15 MG PO TABS: 15.0000 mg | ORAL_TABLET | Freq: Every day | ORAL | 0 refills | Status: DC

## 2018-01-11 MED ORDER — HYDROXYZINE HCL 25 MG PO TABS: 25.0000 mg | ORAL_TABLET | Freq: Four times a day (QID) | ORAL | 0 refills | Status: DC | PRN

## 2018-01-11 MED ORDER — CEPHALEXIN 500 MG PO CAPS: 500.0000 mg | ORAL_CAPSULE | Freq: Four times a day (QID) | ORAL | 0 refills | Status: DC

## 2018-01-11 MED ORDER — IBUPROFEN 400 MG PO TABS: 400.0000 mg | ORAL_TABLET | Freq: Four times a day (QID) | ORAL | 0 refills | Status: DC | PRN

## 2018-01-11 MED ORDER — BUPROPION HCL ER (XL) 300 MG PO TB24: 300.0000 mg | ORAL_TABLET | Freq: Every day | ORAL | 0 refills | Status: DC

## 2018-01-11 MED ORDER — TRAZODONE HCL 50 MG PO TABS: 50.0000 mg | ORAL_TABLET | Freq: Every evening | ORAL | 0 refills | Status: DC | PRN

## 2018-01-11 MED ORDER — OXYCODONE HCL 5 MG PO TABS: 5.0000 mg | ORAL_TABLET | ORAL | 0 refills | Status: DC | PRN

## 2018-01-11 MED ORDER — DOCUSATE SODIUM 100 MG PO CAPS: 100.0000 mg | ORAL_CAPSULE | Freq: Two times a day (BID) | ORAL | 0 refills | Status: DC

## 2018-01-11 NOTE — Discharge Summary (Signed)
Physician Discharge Summary  Logan Hancock K Ham ZOX:[id number] DOB: 07/29/1990 DOA: 12/27/2017  PCP: Patient, No Pcp Per  Admit date: 12/27/2017 Discharge date: 01/11/2018  Admitted From: Home  Disposition: Homeless  Recommendations for Outpatient Follow-up:  1. Follow up with PCP in 1-2 weeks 2. Please obtain BMP/CBC in one week 3. Needs to follow up with Dr Ninetta LightsHatcher after completion of oral antibiotics.  4. Needs to follow up with IM teaching service for opioid use disorder.     Discharge Condition: stable.  CODE STATUS: full code.  Diet recommendation: Heart Healthy   Brief/Interim Summary: 28 year old male with history of polysubstance abuse including cocaine, IV heroin and marijuana; bipolar disorder presented with 5 days of right shoulder pain and swelling.  He was found to be febrile in the ED with elevated CRP.  Orthopedics was consulted for probable septic right shoulder.  MRI revealed signs concerning for septic arthritis.  Patient was started on antibiotics.  He was also found to have staph bacteremia, ID was consulted.  Patient underwent I and D for right acromioclavicular joint septic arthritis on 1219.  Patient was also found to have staph bacteremia.  He had a TEE 12-31 which was negative for endocarditis.  Awaiting recommendation from ID for antibiotics length   Discharge Diagnoses:  Active Problems:   Polysubstance abuse (HCC)   Heroin use disorder, severe (HCC)   Sepsis (HCC)   Septic arthritis of shoulder, right (HCC)   Hyponatremia   Infection of shoulder (HCC)  Sepsis secondary to septic joint 12/30/2017: IV antibiotics as per infectious disease team.  Sepsis physiology has resolved. WBC normalized.   Right acromioclavicular joint septic arthritis -Confirmed by MRI. -Status post I&D on 12/28/2017.  Follow OR cultures; Gram stain showing gram-positive cocci in pairs.   -Initially started on broad-spectrum antibiotics which were switched to IV Ancef by ID on  12/28/2017. -Started  ibuprofen as needed for pain. Short course of opioids. I made referral to out patient IM teaching service for opioids use disorder.   Leukocytosis; -Probably secondary to above.    Acute kidney injury ; -Probably secondary to above.  Improving.  Repeat a.m. labs.  IV fluids plan as above. -Resolved.  Continue to monitor renal function and electrolytes.   Staph bacteremia -BCID suggests MSSA bacteremia.  -2D echo was negative for any vegetation. 01/01/2018: Repeat blood cultures have not grown any organisms to date.   -TEE negative. Received 2 weeks of IV antibiotics , he will be discharge on  keflex QID for one month. CM assisted with medications.    Polysubstance abuse  -Reports recent use of heroin and methamphetamines -No withdrawal.    -Social worker consult for substance abuse Refer him to IMTS for opiod use. appointment was provided to patient.  I have prescribe short course of oxycodone.   Thrombocytopenia -Monitor.  No signs of bleeding. 12/30/2017: Resolved.  Hypokalemia Resolved  Hypomagnesemia Resolved   Hypo-natremia/hypochloremia -Improved.  Monitor 12/30/2017: Resolved  Bipolar disorder -Patient apparently is no longer on medications for bipolar disorder.  Outpatient follow-up on bupropion, trazodone.   History of asthma -Albuterol as needed.   Discharge Instructions  Discharge Instructions    Diet - low sodium heart healthy   Complete by:  As directed    Increase activity slowly   Complete by:  As directed      Allergies as of 01/11/2018   No Known Allergies     Medication List    STOP taking these medications   divalproex 500  MG DR tablet Commonly known as:  DEPAKOTE   lamoTRIgine 100 MG tablet Commonly known as:  LAMICTAL   ofloxacin 0.3 % OTIC solution Commonly known as:  FLOXIN     TAKE these medications   albuterol 108 (90 Base) MCG/ACT inhaler Commonly known as:  PROVENTIL HFA;VENTOLIN  HFA Inhale 1-2 puffs into the lungs every 6 (six) hours as needed for wheezing or shortness of breath.   buPROPion 300 MG 24 hr tablet Commonly known as:  WELLBUTRIN XL Take 1 tablet (300 mg total) by mouth daily. For mood control   cephALEXin 500 MG capsule Commonly known as:  KEFLEX Take 1 capsule (500 mg total) by mouth 4 (four) times daily.   docusate sodium 100 MG capsule Commonly known as:  COLACE Take 1 capsule (100 mg total) by mouth 2 (two) times daily.   hydrOXYzine 25 MG tablet Commonly known as:  ATARAX/VISTARIL Take 1 tablet (25 mg total) by mouth every 6 (six) hours as needed for anxiety.   ibuprofen 400 MG tablet Commonly known as:  ADVIL,MOTRIN Take 1 tablet (400 mg total) by mouth every 6 (six) hours as needed for moderate pain.   mirtazapine 15 MG tablet Commonly known as:  REMERON Take 1 tablet (15 mg total) by mouth at bedtime.   oxyCODONE 5 MG immediate release tablet Commonly known as:  Oxy IR/ROXICODONE Take 1 tablet (5 mg total) by mouth every 4 (four) hours as needed for moderate pain (pain score 4-6).   traZODone 50 MG tablet Commonly known as:  DESYREL Take 1 tablet (50 mg total) by mouth at bedtime as needed for sleep.      Follow-up Information    Beverely Low, MD. Call in 2 weeks.   Specialty:  Orthopedic Surgery Contact information: [address] [place] [address] [place] [address] [phone]        Please follow up.   Why:  [phone]       [facility]. Go on 01/24/2018.   Why:  primary care at 1:30pm Contact information: [address] [place] [address] [phone]         No Known Allergies  Consultations: ID  Procedures/Studies: Dg Shoulder Right  Result Date: 12/27/2017 CLINICAL DATA:  RIGHT shoulder pain for 5 days, possible repetitive stress injury. EXAM: RIGHT SHOULDER - 2 VIEW; RIGHT HUMERUS - 2+ VIEW COMPARISON:  None. FINDINGS: RIGHT shoulder:  The humeral head is well-formed and located. The subacromial, glenohumeral and acromioclavicular joint spaces are intact. No destructive bony lesions. Soft tissue planes are non-suspicious. RIGHT humerus: No acute fracture deformity or dislocation. No destructive bony lesions. Soft tissue planes are not suspicious. IMPRESSION: Negative. Electronically Signed   By: Awilda Metro M.D.   On: 12/27/2017 14:56   Mr Shoulder Right Wo Contrast  Result Date: 12/27/2017 CLINICAL DATA:  Severe right shoulder pain. Sepsis. History of IV drug abuse. EXAM: MRI OF THE RIGHT SHOULDER WITHOUT CONTRAST TECHNIQUE: Multiplanar, multisequence MR imaging of the shoulder was performed. No intravenous contrast was administered. COMPARISON:  Right shoulder x-rays from same day. FINDINGS: Rotator cuff:  Intact rotator cuff. Muscles: No atrophy or abnormal signal of the muscles of the rotator cuff. Edema within the distal trapezius muscle and proximal deltoid muscle near the clavicular attachment. Biceps long head:  Intact and normally positioned. Acromioclavicular Joint: Small amount of fluid within the acromioclavicular joint with prominent periarticular soft tissue inflammatory changes. Type I acromion. Trace fluid in the subacromial/subdeltoid bursa.  Glenohumeral Joint: No joint effusion. No chondral defect. Labrum: Grossly intact, but evaluation is limited by lack of intraarticular fluid. Bones:  No marrow abnormality, fracture or dislocation. Other: No fluid collection. IMPRESSION: 1. Findings concerning for acromioclavicular joint septic arthritis with small joint effusion and prominent periarticular soft tissue inflammatory changes involving the distal trapezius muscle and proximal deltoid muscle near the clavicular attachment. 2. No evidence of osteomyelitis.  No abscess. 3. Trace subacromial/subdeltoid bursal fluid is likely reactive. 4. No glenohumeral joint effusion. Electronically Signed   By: Obie Dredge M.D.   On:  12/27/2017 22:59   Dg Humerus Right  Result Date: 12/27/2017 CLINICAL DATA:  RIGHT shoulder pain for 5 days, possible repetitive stress injury. EXAM: RIGHT SHOULDER - 2 VIEW; RIGHT HUMERUS - 2+ VIEW COMPARISON:  None. FINDINGS: RIGHT shoulder: The humeral head is well-formed and located. The subacromial, glenohumeral and acromioclavicular joint spaces are intact. No destructive bony lesions. Soft tissue planes are non-suspicious. RIGHT humerus: No acute fracture deformity or dislocation. No destructive bony lesions. Soft tissue planes are not suspicious. IMPRESSION: Negative. Electronically Signed   By: Awilda Metro M.D.   On: 12/27/2017 14:56      Subjective: Anxious wants to be discharge.    Discharge Exam: Vitals:   01/10/18 2045 01/11/18 0548  BP: (!) 166/85 126/76  Pulse: 83 66  Resp: 10 16  Temp: 97.9 F (36.6 C) 97.7 F (36.5 C)  SpO2: 100% 100%   Vitals:   01/10/18 0543 01/10/18 1322 01/10/18 2045 01/11/18 0548  BP: 140/74 (!) 151/92 (!) 166/85 126/76  Pulse: 76 (!) 103 83 66  Resp: 16 18 10 16   Temp: 97.8 F (36.6 C) 98.1 F (36.7 C) 97.9 F (36.6 C) 97.7 F (36.5 C)  TempSrc: Oral Oral Oral Oral  SpO2: 100% 98% 100% 100%  Weight:      Height:        General: Pt is alert, awake, not in acute distress Cardiovascular: RRR, S1/S2 +, no rubs, no gallops Respiratory: CTA bilaterally, no wheezing, no rhonchi Abdominal: Soft, NT, ND, bowel sounds + Extremities: no edema, no cyanosis    The results of significant diagnostics from this hospitalization (including imaging, microbiology, ancillary and laboratory) are listed below for reference.     Microbiology: No results found for this or any previous visit (from the past 240 hour(s)).   Labs: BNP (last 3 results) No results for input(s): BNP in the last 8760 hours. Basic Metabolic Panel: Recent Labs  Lab 01/10/18 0730  NA 140  K 4.0  CL 105  CO2 26  GLUCOSE 88  BUN 15  CREATININE 0.73  CALCIUM  8.6*   Liver Function Tests: No results for input(s): AST, ALT, ALKPHOS, BILITOT, PROT, ALBUMIN in the last 168 hours. No results for input(s): LIPASE, AMYLASE in the last 168 hours. No results for input(s): AMMONIA in the last 168 hours. CBC: Recent Labs  Lab 01/10/18 0730  WBC 7.8  HGB 12.4*  HCT 40.6  MCV 85.8  PLT 345   Cardiac Enzymes: No results for input(s): CKTOTAL, CKMB, CKMBINDEX, TROPONINI in the last 168 hours. BNP: Invalid input(s): POCBNP CBG: No results for input(s): GLUCAP in the last 168 hours. D-Dimer No results for input(s): DDIMER in the last 72 hours. Hgb A1c No results for input(s): HGBA1C in the last 72 hours. Lipid Profile No results for input(s): CHOL, HDL, LDLCALC, TRIG, CHOLHDL, LDLDIRECT in the last 72 hours. Thyroid function studies No results for input(s): TSH, T4TOTAL,  T3FREE, THYROIDAB in the last 72 hours.  Invalid input(s): FREET3 Anemia work up No results for input(s): VITAMINB12, FOLATE, FERRITIN, TIBC, IRON, RETICCTPCT in the last 72 hours. Urinalysis    Component Value Date/Time   COLORURINE YELLOW 08/21/2015 1539   APPEARANCEUR CLEAR 08/21/2015 1539   LABSPEC 1.025 08/21/2015 1539   PHURINE 6.0 08/21/2015 1539   GLUCOSEU NEGATIVE 08/21/2015 1539   HGBUR NEGATIVE 08/21/2015 1539   BILIRUBINUR NEGATIVE 08/21/2015 1539   KETONESUR NEGATIVE 08/21/2015 1539   PROTEINUR NEGATIVE 08/21/2015 1539   NITRITE NEGATIVE 08/21/2015 1539   LEUKOCYTESUR NEGATIVE 08/21/2015 1539   Sepsis Labs Invalid input(s): PROCALCITONIN,  WBC,  LACTICIDVEN Microbiology No results found for this or any previous visit (from the past 240 hour(s)).   Time coordinating discharge: 35 minutes.   SIGNED:   Alba CoryBelkys A Waunita Sandstrom, MD  [facility] 01/11/2018, 1:09 PM Pager   If 7PM-7AM, please contact night-coverage [email or web address] Password [email or web address]

## 2018-01-11 NOTE — Care Management Note (Signed)
Case Management Note  Patient Details  Name: Logan Hancock MRN: [id number] Date of Birth: 10-Jun-1990  Subjective/Objective:   Spoke with patient at bedside alone with CSW. Patient states he plans to d/c home with family. He needs assistance with transportation to get to his appointments, CSW to provide bus passes. He has not been working so has no income, needs assistance with meds. Discussed MATCH benefits and limitations. Patient is agreeable. Needs f/u with PCP. CSW discussed substance abuse and resources.                 Action/Plan: Complete MATCH and provide letter to patient. Appointment made at [facility] for hospital f/u with patients permission.  Expected Discharge Date:  (unknown)               Expected Discharge Plan:  Home/Self Care  In-House Referral:  Clinical Social Work, PCP / Management consultant  Discharge planning Services  CM Consult, [facility], Follow-up appt scheduled, Medication Assistance  Post Acute Care Choice:  NA Choice offered to:  Patient  DME Arranged:  N/A DME Agency:  NA  HH Arranged:  NA HH Agency:  NA  Status of Service:  Completed, signed off  If discussed at [employer] of Stay Meetings, dates discussed:    Additional Comments:  Alexis Goodell, RN 01/11/2018, 9:39 AM

## 2018-01-11 NOTE — Plan of Care (Signed)
Plan of care reviewed and discussed with patient.   

## 2018-01-11 NOTE — Progress Notes (Signed)
Patient very agitated and ready to leave. Stated that he is about to walk out. I tried to calm the patient down and paged the MD to update her. He is now in his room waiting to see the MD and is hoping for discharge orders soon.   Will continue to monitor.

## 2018-01-12 NOTE — Clinical Social Work Note (Signed)
Clinical Social Work Assessment  Patient Details  Name: Logan Hancock MRN: [id number] Date of Birth: 1990/09/23  Date of referral:  01/11/18               Reason for consult:  Substance Use/ETOH Abuse, Transportation                Permission sought to share information with:    Permission granted to share information::  Yes, Verbal Permission Granted  Name::        Agency::     Relationship::     Contact Information:     Housing/Transportation Living arrangements for the past 2 months:  Single Family Home Source of Information:  Patient Patient Interpreter Needed:  None Criminal Activity/Legal Involvement Pertinent to Current Situation/Hospitalization:  No - Comment as needed Significant Relationships:  None Lives with:  Self Do you feel safe going back to the place where you live?  Yes Need for family participation in patient care:  No (Coment)  Care giving concerns:   Substance use and Transportation needs.    Social Worker assessment / plan:  CSW and Case Manager met with the patient at bedside to discuss his discharge plan. Patient he is currently homeless and will not have transportation to his follow up appointments. Patient reports he has not been able to work due to arm pain. Patient reports he does not have any source of income at this time.  Patient reports he was recently living with a friend but is unsure if they will allow him to return. Patient reports he is not interested in any SA treatment at this time. He reports he has only used illicit drugs three times before and does not plan to continue using.   -CSW offered patient a list of homeless shelters . CSW provided the patient with 3 bus ticket to get a ride home, to go to his follow appointment with ID and the wellness center.  Case Manager assisted patient with his medications.   Employment status:  Unemployed Forensic scientist:  Self Pay (Medicaid Pending) PT Recommendations:  Not assessed at this  time Information / Referral to community resources:     Patient/Family's Response to care:  Agreeable to Care. Patient/Family's Understanding of and Emotional Response to Diagnosis, Current Treatment, and Prognosis:  Patient understands that illicit drug use "is not good for my health." Per patient, " I don't plan to use anymore." Patient understands his current diagnosis and follow up plan.   Emotional Assessment Appearance:  Appears stated age Attitude/Demeanor/Rapport:    Affect (typically observed):  Accepting Orientation:  Oriented to Self, Oriented to Place, Oriented to  Time, Oriented to Situation Alcohol / Substance use:  Illicit Drugs, Tobacco Use Psych involvement (Current and /or in the community):  No (Comment)  Discharge Needs  Concerns to be addressed:  Substance Abuse Concerns Readmission within the last 30 days:  No Current discharge risk:  Substance Abuse Barriers to Discharge:  No Barriers Identified   Lia Hopping, LCSW 01/12/2018, 12:31 PM

## 2018-01-13 LAB — HEPATITIS C GENOTYPE

## 2018-01-16 ENCOUNTER — Telehealth: Payer: Self-pay | Admitting: *Deleted

## 2018-01-16 NOTE — Telephone Encounter (Signed)
Attempted to reach pt at mobile# listed, left a message for rtc

## 2018-01-19 NOTE — Telephone Encounter (Signed)
The mobile ph# is listed as a relatives ph, have left #2 message for pt's return call

## 2018-01-22 ENCOUNTER — Other Ambulatory Visit: Payer: Self-pay

## 2018-01-22 ENCOUNTER — Emergency Department (HOSPITAL_COMMUNITY)
Admission: EM | Admit: 2018-01-22 | Discharge: 2018-01-23 | Disposition: A | Discharge status: Other Acute Inpatient Hospital | Payer: Self-pay | Attending: Emergency Medicine | Admitting: Emergency Medicine

## 2018-01-22 ENCOUNTER — Encounter (HOSPITAL_COMMUNITY): Payer: Self-pay

## 2018-01-22 ENCOUNTER — Emergency Department (HOSPITAL_COMMUNITY)
Admission: EM | Admit: 2018-01-22 | Discharge: 2018-01-22 | Disposition: A | Discharge status: Home / Self Care | Payer: Self-pay | Attending: Emergency Medicine | Admitting: Emergency Medicine

## 2018-01-22 DIAGNOSIS — Z79899 Other long term (current) drug therapy: Secondary | ICD-10-CM | POA: Insufficient documentation

## 2018-01-22 DIAGNOSIS — R45851 Suicidal ideations: Secondary | ICD-10-CM

## 2018-01-22 DIAGNOSIS — F17210 Nicotine dependence, cigarettes, uncomplicated: Secondary | ICD-10-CM | POA: Insufficient documentation

## 2018-01-22 DIAGNOSIS — J45909 Unspecified asthma, uncomplicated: Secondary | ICD-10-CM | POA: Insufficient documentation

## 2018-01-22 DIAGNOSIS — Z4800 Encounter for change or removal of nonsurgical wound dressing: Secondary | ICD-10-CM | POA: Insufficient documentation

## 2018-01-22 DIAGNOSIS — Z5189 Encounter for other specified aftercare: Secondary | ICD-10-CM

## 2018-01-22 DIAGNOSIS — Z4802 Encounter for removal of sutures: Secondary | ICD-10-CM

## 2018-01-22 DIAGNOSIS — F332 Major depressive disorder, recurrent severe without psychotic features: Secondary | ICD-10-CM | POA: Insufficient documentation

## 2018-01-22 DIAGNOSIS — F1120 Opioid dependence, uncomplicated: Secondary | ICD-10-CM | POA: Insufficient documentation

## 2018-01-22 LAB — COMPREHENSIVE METABOLIC PANEL
ALBUMIN: 3.6 g/dL (ref 3.5–5.0)
ALT: 23 U/L (ref 0–44)
ALT: 23 U/L (ref 0–44)
AST: 26 U/L (ref 15–41)
AST: 25 U/L (ref 15–41)
Albumin: 4 g/dL (ref 3.5–5.0)
Alkaline Phosphatase: 92 U/L (ref 38–126)
Alkaline Phosphatase: 84 U/L (ref 38–126)
Anion gap: 11 (ref 5–15)
Anion gap: 9 (ref 5–15)
BUN: 9 mg/dL (ref 6–20)
BUN: 8 mg/dL (ref 6–20)
CO2: 24 mmol/L (ref 22–32)
CO2: 27 mmol/L (ref 22–32)
Calcium: 9.2 mg/dL (ref 8.9–10.3)
Calcium: 9 mg/dL (ref 8.9–10.3)
Chloride: 104 mmol/L (ref 98–111)
Chloride: 99 mmol/L (ref 98–111)
Creatinine, Ser: 0.77 mg/dL (ref 0.61–1.24)
Creatinine, Ser: 0.83 mg/dL (ref 0.61–1.24)
GFR calc Af Amer: >60 mL/min (ref >60)
GFR calc Af Amer: >60 mL/min (ref >60)
GFR calc non Af Amer: >60 mL/min (ref >60)
GFR calc non Af Amer: >60 mL/min (ref >60)
Glucose, Bld: 93 mg/dL (ref 70–99)
Glucose, Bld: 66 mg/dL — ABNORMAL LOW (ref 70–99)
Potassium: 4.2 mmol/L (ref 3.5–5.1)
Potassium: 3.8 mmol/L (ref 3.5–5.1)
Sodium: 139 mmol/L (ref 135–145)
Sodium: 135 mmol/L (ref 135–145)
Total Bilirubin: 1.1 mg/dL (ref 0.3–1.2)
Total Bilirubin: 1.2 mg/dL (ref 0.3–1.2)
Total Protein: 8 g/dL (ref 6.5–8.1)
Total Protein: 7.5 g/dL (ref 6.5–8.1)

## 2018-01-22 LAB — CBC WITH DIFFERENTIAL/PLATELET
Abs Immature Granulocytes: 0.05 10*3/uL (ref 0.00–0.07)
Basophils Absolute: 0 10*3/uL (ref 0.0–0.1)
Basophils Relative: 0 %
Eosinophils Absolute: 0.4 10*3/uL (ref 0.0–0.5)
Eosinophils Relative: 4 %
HCT: 44.1 % (ref 39.0–52.0)
Hemoglobin: 13.5 g/dL (ref 13.0–17.0)
Immature Granulocytes: 1 %
Lymphocytes Relative: 33 %
Lymphs Abs: 3 10*3/uL (ref 0.7–4.0)
MCH: 26.4 pg (ref 26.0–34.0)
MCHC: 30.6 g/dL (ref 30.0–36.0)
MCV: 86.3 fL (ref 80.0–100.0)
Monocytes Absolute: 0.5 10*3/uL (ref 0.1–1.0)
Monocytes Relative: 5 %
Neutro Abs: 5.3 10*3/uL (ref 1.7–7.7)
Neutrophils Relative %: 57 %
Platelets: 218 10*3/uL (ref 150–400)
RBC: 5.11 MIL/uL (ref 4.22–5.81)
RDW: 14.9 % (ref 11.5–15.5)
WBC: 9.1 10*3/uL (ref 4.0–10.5)
nRBC: 0 % (ref 0.0–0.2)

## 2018-01-22 LAB — CBC
HCT: 43.1 % (ref 39.0–52.0)
Hemoglobin: 13.4 g/dL (ref 13.0–17.0)
MCH: 26.6 pg (ref 26.0–34.0)
MCHC: 31.1 g/dL (ref 30.0–36.0)
MCV: 85.5 fL (ref 80.0–100.0)
PLATELETS: 268 10*3/uL (ref 150–400)
RBC: 5.04 MIL/uL (ref 4.22–5.81)
RDW: 14.8 % (ref 11.5–15.5)
WBC: 9.4 10*3/uL (ref 4.0–10.5)
nRBC: 0 % (ref 0.0–0.2)

## 2018-01-22 LAB — RAPID URINE DRUG SCREEN, HOSP PERFORMED
Amphetamines: NOT DETECTED
Barbiturates: NOT DETECTED
Benzodiazepines: NOT DETECTED
Cocaine: NOT DETECTED
Opiates: POSITIVE — AB
Tetrahydrocannabinol: NOT DETECTED

## 2018-01-22 LAB — CBG MONITORING, ED: Glucose-Capillary: 113 mg/dL — ABNORMAL HIGH (ref 70–99)

## 2018-01-22 LAB — I-STAT CG4 LACTIC ACID, ED: Lactic Acid, Venous: 0.83 mmol/L (ref 0.5–1.9)

## 2018-01-22 LAB — ETHANOL: Alcohol, Ethyl (B): <10 mg/dL (ref <10)

## 2018-01-22 MED ORDER — ONDANSETRON HCL 4 MG PO TABS: 4.0000 mg | ORAL_TABLET | Freq: Three times a day (TID) | ORAL | Status: DC | PRN

## 2018-01-22 MED ORDER — NICOTINE 21 MG/24HR TD PT24: 21.0000 mg | MEDICATED_PATCH | Freq: Every day | TRANSDERMAL | Status: DC

## 2018-01-22 MED ORDER — IBUPROFEN 400 MG PO TABS: 600.0000 mg | ORAL_TABLET | Freq: Three times a day (TID) | ORAL | Status: DC | PRN

## 2018-01-22 NOTE — ED Notes (Signed)
Pt has been given sandwich bag and drink-Monique,RN

## 2018-01-22 NOTE — ED Triage Notes (Signed)
Pt reports SI with plan to OD on heroin. Pt last use 12pm today. Denies HI. Denies AH/VH. Pt alert and cooperative in triage

## 2018-01-22 NOTE — ED Provider Notes (Signed)
[facility]   CSN: [id number] Arrival date & time: 01/22/18  1516     History   Chief Complaint Chief Complaint  Patient presents with  . Suture / Staple Removal    HPI Logan Hancock is a 28 y.o. male.  HPI   27yM presenting for wound check.  Pt recently admitted with septic R AC arthritis.  He underwent I&D.  He received IV antibiotics during admission and was prescribed continue antibiotics upon discharge.  He only filled this prescription for pain medicine though.  He would like his wound reevaluated. Still having some mild redness.  He is still having some pain although it is improved since the time of discharge.  Past Medical History:  Diagnosis Date  . Asthma   . Bipolar 1 disorder (HCC)   . Drug addiction (HCC)   . MRSA (methicillin resistant staph aureus) culture positive     Patient Active Problem List   Diagnosis Date Noted  . Septic arthritis of shoulder, right (HCC) 12/28/2017  . Hyponatremia 12/28/2017  . Infection of shoulder (HCC) 12/28/2017  . Sepsis (HCC) 12/27/2017  . Severe recurrent major depression without psychotic features (HCC) 12/03/2016  . Heroin use disorder, severe (HCC) 12/03/2016  . Substance induced mood disorder (HCC) 10/14/2016  . Polysubstance abuse (HCC) 10/14/2016  . Suicidal ideation     Past Surgical History:  Procedure Laterality Date  . SHOULDER ARTHROSCOPY Right 12/28/2017   Procedure: ARTHROSCOPY SHOULDER  incision and drainage right shoulder and open incision and drainage shoulder joint;  Surgeon: Beverely LowNorris, Steve, MD;  Location: [facility];  Service: Orthopedics;  Laterality: Right;  . TEE WITHOUT CARDIOVERSION N/A 01/09/2018   Procedure: TRANSESOPHAGEAL ECHOCARDIOGRAM (TEE);  Surgeon: Jake BatheSkains, Mark C, MD;  [facility];  Service: Cardiovascular;  Laterality: N/A;  with anesthesia        Home Medications    Prior to Admission medications   Medication Sig Start Date  End Date Taking? Authorizing Provider  oxyCODONE (OXY IR/ROXICODONE) 5 MG immediate release tablet Take 1 tablet (5 mg total) by mouth every 4 (four) hours as needed for moderate pain (pain score 4-6). 01/11/18  Yes Regalado, Belkys A, MD  albuterol (PROVENTIL HFA;VENTOLIN HFA) 108 (90 Base) MCG/ACT inhaler Inhale 1-2 puffs into the lungs every 6 (six) hours as needed for wheezing or shortness of breath. Patient not taking: Reported on 01/22/2018 01/11/18   Regalado, Jon BillingsBelkys A, MD  buPROPion (WELLBUTRIN XL) 300 MG 24 hr tablet Take 1 tablet (300 mg total) by mouth daily. For mood control Patient not taking: Reported on 01/22/2018 01/11/18   Regalado, Jon BillingsBelkys A, MD  cephALEXin (KEFLEX) 500 MG capsule Take 1 capsule (500 mg total) by mouth 4 (four) times daily. Patient not taking: Reported on 01/22/2018 01/11/18 02/10/18  Regalado, Jon BillingsBelkys A, MD  docusate sodium (COLACE) 100 MG capsule Take 1 capsule (100 mg total) by mouth 2 (two) times daily. Patient not taking: Reported on 01/22/2018 01/11/18   Hartley Barefootegalado, Belkys A, MD  hydrOXYzine (ATARAX/VISTARIL) 25 MG tablet Take 1 tablet (25 mg total) by mouth every 6 (six) hours as needed for anxiety. Patient not taking: Reported on 01/22/2018 01/11/18   Regalado, Jon BillingsBelkys A, MD  ibuprofen (ADVIL,MOTRIN) 400 MG tablet Take 1 tablet (400 mg total) by mouth every 6 (six) hours as needed for moderate pain. Patient not taking: Reported on 01/22/2018 01/11/18   Regalado, Jon BillingsBelkys A, MD  mirtazapine (REMERON) 15 MG tablet Take 1 tablet (15 mg total)  by mouth at bedtime. Patient not taking: Reported on 01/22/2018 01/11/18   Hartley Barefoot A, MD  traZODone (DESYREL) 50 MG tablet Take 1 tablet (50 mg total) by mouth at bedtime as needed for sleep. Patient not taking: Reported on 01/22/2018 01/11/18   Alba Cory, MD    Family History No family history on file.  Social History Social History   Tobacco Use  . Smoking status: Current Every Day Smoker    Packs/day: 0.50    Years: 2.00     Pack years: 1.00    Types: Cigarettes  . Smokeless tobacco: Never Used  Substance Use Topics  . Alcohol use: No    Frequency: Never  . Drug use: Yes    Frequency: 3.0 times per week    Types: Marijuana, Cocaine, IV    Comment: last crack use 01/18/17, pt states he has been using heroin for pain.     Allergies   Patient has no known allergies.   Review of Systems Review of Systems  All systems reviewed and negative, other than as noted in HPI.  Physical Exam Updated Vital Signs BP (!) 142/94 (BP Location: Left Arm)   Pulse 91   Temp 97.7 F (36.5 C) (Oral)   Resp 16   Ht 6\' 4"  (1.93 m)   Wt 102.1 kg   SpO2 96%   BMI 27.39 kg/m   Physical Exam Vitals signs and nursing note reviewed.  Constitutional:      General: He is not in acute distress.    Appearance: He is well-developed.  HENT:     Head: Normocephalic and atraumatic.  Eyes:     General:        Right eye: No discharge.        Left eye: No discharge.     Conjunctiva/sclera: Conjunctivae normal.  Neck:     Musculoskeletal: Neck supple.  Cardiovascular:     Rate and Rhythm: Normal rate and regular rhythm.     Heart sounds: Normal heart sounds. No murmur. No friction rub. No gallop.   Pulmonary:     Effort: Pulmonary effort is normal. No respiratory distress.     Breath sounds: Normal breath sounds.  Abdominal:     General: There is no distension.     Palpations: Abdomen is soft.     Tenderness: There is no abdominal tenderness.  Musculoskeletal:        General: No tenderness.     Comments: Sutures in surgical incision over R AC joint and single suture posteriorly. Minimal erythema around a couple of the sutures consistent with FB reaction. Overall, appears to be healing well. Can actively range R shoulder fully with no apparent discomfort.   Skin:    General: Skin is warm and dry.  Neurological:     Mental Status: He is alert.  Psychiatric:        Behavior: Behavior normal.        Thought Content:  Thought content normal.      ED Treatments / Results  Labs (all labs ordered are listed, but only abnormal results are displayed) Labs Reviewed  COMPREHENSIVE METABOLIC PANEL  CBC WITH DIFFERENTIAL/PLATELET  I-STAT CG4 LACTIC ACID, ED    EKG None  Radiology No results found.  Procedures Procedures (including critical care time)  SUTURE REMOVAL Performed by: Raeford Razor  Consent: Verbal consent obtained. Patient identity confirmed: provided demographic data Time out: Immediately prior to procedure a "time out" was called to verify the correct  patient, procedure, equipment, support staff and site/side marked as required.  Location details: r shoulder  Wound Appearance: clean  Sutures/Staples Removed.  Facility: sutures placed in the OR by orthopedic surgery  Patient tolerance: Patient tolerated the procedure well with no immediate complications.    Patient presents for suture removal. The wound is well healed without signs of infection.  The sutures are removed. Wound care and activity instructions given. Return prn.   Medications Ordered in ED Medications - No data to display   Initial Impression / Assessment and Plan / ED Course  I have reviewed the triage vital signs and the nursing notes.  Pertinent labs & imaging results that were available during my care of the patient were reviewed by me and considered in my medical decision making (see chart for details).     Shoulder exam is reassuring. Full active ROM.. NVI. Sutures removed. Basic labs unremarkable. No systemic symptoms. Has been off of abx for almost 2 weeks at this point. I don't think there is much utility in starting them again at this time. Pt advised that he needs to make an appointment with the IM teaching service and orthopedics ASAP for follow-up. He was given their contact information again.   Final Clinical Impressions(s) / ED Diagnoses   Final diagnoses:  Visit for wound check  Visit  for suture removal    ED Discharge Orders    None       Raeford RazorKohut, Danniell Rotundo, MD 01/30/18 [phone]

## 2018-01-22 NOTE — ED Triage Notes (Addendum)
Pt states that he needs suture removal . Pt states that they have been in for 4 weeks.  Pt states he is concerned for infection, as he never took his abx after surgery, as he couldn't afford them. Pt states he has been using heroin for pain control. Pt also admits to crack use 01/18/18

## 2018-01-22 NOTE — ED Notes (Signed)
Bed: AX65 Expected date:  Expected time:  Means of arrival:  Comments: Triage [facility]

## 2018-01-22 NOTE — ED Provider Notes (Signed)
[name] EMERGENCY DEPARTMENT Provider Note   CSN: [id number] Arrival date & time: 01/22/18  1759     History   Chief Complaint Chief Complaint  Patient presents with  . Suicidal    HPI Logan Hancock is a 28 y.o. male.  28 year old male with history of bipolar 1 disorder, drug addiction presents to the emergency department reporting suicidal ideations.  He was seen earlier in the day at [facility] without complaints of SI.  States that he has been having thoughts of overdosing on heroin.  Last use heroin at noon today.  Uses heroin IV almost daily.  Endorses increased stress from the loss of his mother in June.  Has a history of intentional overdose 2 years ago.  No HI, auditory or visual hallucinations.  Denies any other medical complaints at this time.  Last admitted to behavioral health in November 2018.  The history is provided by the patient. No language interpreter was used.    Past Medical History:  Diagnosis Date  . Asthma   . Bipolar 1 disorder (HCC)   . Drug addiction (HCC)   . MRSA (methicillin resistant staph aureus) culture positive     Patient Active Problem List   Diagnosis Date Noted  . Septic arthritis of shoulder, right (HCC) 12/28/2017  . Hyponatremia 12/28/2017  . Infection of shoulder (HCC) 12/28/2017  . Sepsis (HCC) 12/27/2017  . Severe recurrent major depression without psychotic features (HCC) 12/03/2016  . Heroin use disorder, severe (HCC) 12/03/2016  . Substance induced mood disorder (HCC) 10/14/2016  . Polysubstance abuse (HCC) 10/14/2016  . Suicidal ideation     Past Surgical History:  Procedure Laterality Date  . SHOULDER ARTHROSCOPY Right 12/28/2017   Procedure: ARTHROSCOPY SHOULDER  incision and drainage right shoulder and open incision and drainage shoulder joint;  Surgeon: Beverely Low, MD;  Location: [facility];  Service: Orthopedics;  Laterality: Right;  . TEE WITHOUT CARDIOVERSION N/A 01/09/2018   Procedure: TRANSESOPHAGEAL ECHOCARDIOGRAM (TEE);  Surgeon: Jake Bathe, MD;  Location: [facility] ENDOSCOPY;  Service: Cardiovascular;  Laterality: N/A;  with anesthesia        Home Medications    Prior to Admission medications   Medication Sig Start Date End Date Taking? Authorizing Provider  albuterol (PROVENTIL HFA;VENTOLIN HFA) 108 (90 Base) MCG/ACT inhaler Inhale 1-2 puffs into the lungs every 6 (six) hours as needed for wheezing or shortness of breath. Patient not taking: Reported on 01/22/2018 01/11/18   Regalado, Jon Billings A, MD  buPROPion (WELLBUTRIN XL) 300 MG 24 hr tablet Take 1 tablet (300 mg total) by mouth daily. For mood control Patient not taking: Reported on 01/22/2018 01/11/18   Regalado, Jon Billings A, MD  cephALEXin (KEFLEX) 500 MG capsule Take 1 capsule (500 mg total) by mouth 4 (four) times daily. Patient not taking: Reported on 01/22/2018 01/11/18 02/10/18  Regalado, Jon Billings A, MD  docusate sodium (COLACE) 100 MG capsule Take 1 capsule (100 mg total) by mouth 2 (two) times daily. Patient not taking: Reported on 01/22/2018 01/11/18   Hartley Barefoot A, MD  hydrOXYzine (ATARAX/VISTARIL) 25 MG tablet Take 1 tablet (25 mg total) by mouth every 6 (six) hours as needed for anxiety. Patient not taking: Reported on 01/22/2018 01/11/18   Regalado, Jon Billings A, MD  ibuprofen (ADVIL,MOTRIN) 400 MG tablet Take 1 tablet (400 mg total) by mouth every 6 (six) hours as needed for moderate pain. Patient not taking: Reported on 01/22/2018 01/11/18   Alba Cory, MD  mirtazapine (REMERON)  15 MG tablet Take 1 tablet (15 mg total) by mouth at bedtime. Patient not taking: Reported on 01/22/2018 01/11/18   Regalado, Jon BillingsBelkys A, MD  oxyCODONE (OXY IR/ROXICODONE) 5 MG immediate release tablet Take 1 tablet (5 mg total) by mouth every 4 (four) hours as needed for moderate pain (pain score 4-6). Patient not taking: Reported on 01/22/2018 01/11/18   Hartley Barefootegalado, Belkys A, MD  traZODone (DESYREL) 50 MG tablet Take 1 tablet (50 mg  total) by mouth at bedtime as needed for sleep. Patient not taking: Reported on 01/22/2018 01/11/18   Alba Coryegalado, Belkys A, MD    Family History No family history on file.  Social History Social History   Tobacco Use  . Smoking status: Current Every Day Smoker    Packs/day: 1.00    Years: 2.00    Pack years: 2.00    Types: Cigarettes  . Smokeless tobacco: Never Used  Substance Use Topics  . Alcohol use: No    Frequency: Never  . Drug use: Yes    Frequency: 3.0 times per week    Types: Marijuana, Cocaine, IV    Comment: last crack use 01/18/17, pt states he has been using heroin for pain.     Allergies   Patient has no known allergies.   Review of Systems Review of Systems Ten systems reviewed and are negative for acute change, except as noted in the HPI.    Physical Exam Updated Vital Signs BP 112/68 (BP Location: Left Arm)   Pulse 87   Temp 98.3 F (36.8 C) (Oral)   Resp 17   SpO2 99%   Physical Exam Vitals signs and nursing note reviewed.  Constitutional:      General: He is not in acute distress.    Appearance: He is well-developed. He is not diaphoretic.     Comments: Nontoxic appearing and in NAD  HENT:     Head: Normocephalic and atraumatic.  Eyes:     General: No scleral icterus.    Conjunctiva/sclera: Conjunctivae normal.  Neck:     Musculoskeletal: Normal range of motion.  Pulmonary:     Effort: Pulmonary effort is normal. No respiratory distress.     Comments: Respirations even and unlabored Musculoskeletal: Normal range of motion.  Skin:    General: Skin is warm and dry.     Coloration: Skin is not pale.     Findings: No erythema or rash.  Neurological:     Mental Status: He is alert and oriented to person, place, and time.  Psychiatric:        Mood and Affect: Mood normal.        Behavior: Behavior normal.     Comments: Reports SI with plan to overdose      ED Treatments / Results  Labs (all labs ordered are listed, but only abnormal  results are displayed) Labs Reviewed  COMPREHENSIVE METABOLIC PANEL - Abnormal; Notable for the following components:      Result Value   Glucose, Bld 66 (*)    All other components within normal limits  RAPID URINE DRUG SCREEN, HOSP PERFORMED - Abnormal; Notable for the following components:   Opiates POSITIVE (*)    All other components within normal limits  CBG MONITORING, ED - Abnormal; Notable for the following components:   Glucose-Capillary 113 (*)    All other components within normal limits  ETHANOL  CBC    EKG None  Radiology No results found.  Procedures Procedures (including critical care time)  Medications Ordered in ED Medications - No data to display   Initial Impression / Assessment and Plan / ED Course  I have reviewed the triage vital signs and the nursing notes.  Pertinent labs & imaging results that were available during my care of the patient were reviewed by me and considered in my medical decision making (see chart for details).     Patient presenting for worsening depression and suicidal ideations.  Also has a history of IV heroin abuse.  The patient has been medically cleared and accepted at [facility] for ongoing psychiatric care.  Vitals:   01/22/18 1803 01/22/18 1948 01/23/18 0147  BP: (!) 144/79 128/74 112/68  Pulse: 96 85 87  Resp: 18 18 17   Temp: 98.2 F (36.8 C)  98.3 F (36.8 C)  TempSrc: Oral  Oral  SpO2: 98% 100% 99%    Final Clinical Impressions(s) / ED Diagnoses   Final diagnoses:  Suicidal ideation    ED Discharge Orders    None       Antony Madura, PA-C 01/23/18 0542    Shaune Pollack, MD 01/23/18 1209

## 2018-01-22 NOTE — ED Notes (Signed)
1st set of cultures down in lab

## 2018-01-23 ENCOUNTER — Encounter (HOSPITAL_COMMUNITY): Payer: Self-pay

## 2018-01-23 ENCOUNTER — Other Ambulatory Visit: Payer: Self-pay

## 2018-01-23 ENCOUNTER — Inpatient Hospital Stay (HOSPITAL_COMMUNITY)
Admission: AD | Admit: 2018-01-23 | Discharge: 2018-01-29 | DRG: 897 | Disposition: A | Discharge status: Home / Self Care | Payer: Federal, State, Local not specified - Other | Source: Intra-hospital | Attending: Psychiatry | Admitting: Psychiatry

## 2018-01-23 DIAGNOSIS — G4700 Insomnia, unspecified: Secondary | ICD-10-CM | POA: Diagnosis present

## 2018-01-23 DIAGNOSIS — R45851 Suicidal ideations: Secondary | ICD-10-CM | POA: Diagnosis present

## 2018-01-23 DIAGNOSIS — F17210 Nicotine dependence, cigarettes, uncomplicated: Secondary | ICD-10-CM | POA: Diagnosis present

## 2018-01-23 DIAGNOSIS — F1124 Opioid dependence with opioid-induced mood disorder: Secondary | ICD-10-CM | POA: Diagnosis present

## 2018-01-23 DIAGNOSIS — Z634 Disappearance and death of family member: Secondary | ICD-10-CM | POA: Diagnosis not present

## 2018-01-23 DIAGNOSIS — J209 Acute bronchitis, unspecified: Secondary | ICD-10-CM | POA: Diagnosis not present

## 2018-01-23 DIAGNOSIS — F1994 Other psychoactive substance use, unspecified with psychoactive substance-induced mood disorder: Secondary | ICD-10-CM

## 2018-01-23 DIAGNOSIS — F1120 Opioid dependence, uncomplicated: Secondary | ICD-10-CM | POA: Diagnosis not present

## 2018-01-23 DIAGNOSIS — J45909 Unspecified asthma, uncomplicated: Secondary | ICD-10-CM | POA: Diagnosis present

## 2018-01-23 DIAGNOSIS — M19011 Primary osteoarthritis, right shoulder: Secondary | ICD-10-CM | POA: Diagnosis present

## 2018-01-23 DIAGNOSIS — Z915 Personal history of self-harm: Secondary | ICD-10-CM | POA: Diagnosis not present

## 2018-01-23 DIAGNOSIS — G8929 Other chronic pain: Secondary | ICD-10-CM | POA: Diagnosis present

## 2018-01-23 DIAGNOSIS — F15980 Other stimulant use, unspecified with stimulant-induced anxiety disorder: Secondary | ICD-10-CM | POA: Diagnosis not present

## 2018-01-23 DIAGNOSIS — Z23 Encounter for immunization: Secondary | ICD-10-CM | POA: Diagnosis not present

## 2018-01-23 DIAGNOSIS — Z79899 Other long term (current) drug therapy: Secondary | ICD-10-CM | POA: Diagnosis not present

## 2018-01-23 DIAGNOSIS — F332 Major depressive disorder, recurrent severe without psychotic features: Secondary | ICD-10-CM

## 2018-01-23 DIAGNOSIS — F419 Anxiety disorder, unspecified: Secondary | ICD-10-CM | POA: Diagnosis present

## 2018-01-23 DIAGNOSIS — J40 Bronchitis, not specified as acute or chronic: Secondary | ICD-10-CM

## 2018-01-23 MED ORDER — METHOCARBAMOL 500 MG PO TABS
500.0000 mg | ORAL_TABLET | Freq: Three times a day (TID) | ORAL | Status: AC | PRN
  Administered 2018-01-23 – 2018-01-27 (×4): 500 mg via ORAL
  Filled 2018-01-23 (×5): qty 1

## 2018-01-23 MED ORDER — CLONIDINE HCL 0.1 MG PO TABS: 0.1000 mg | ORAL_TABLET | Freq: Every day | ORAL | Status: DC

## 2018-01-23 MED ORDER — CLONIDINE HCL 0.1 MG PO TABS
0.1000 mg | ORAL_TABLET | ORAL | Status: DC
  Administered 2018-01-26: 0.1 mg via ORAL
  Filled 2018-01-23 (×3): qty 1

## 2018-01-23 MED ORDER — BUPROPION HCL ER (XL) 300 MG PO TB24
300.0000 mg | ORAL_TABLET | Freq: Every day | ORAL | Status: DC
  Administered 2018-01-23 – 2018-01-29 (×7): 300 mg via ORAL
  Filled 2018-01-23 (×4): qty 1
  Filled 2018-01-23: qty 7
  Filled 2018-01-23 (×6): qty 1

## 2018-01-23 MED ORDER — DOCUSATE SODIUM 100 MG PO CAPS
100.0000 mg | ORAL_CAPSULE | Freq: Two times a day (BID) | ORAL | Status: DC
  Administered 2018-01-23: 100 mg via ORAL
  Filled 2018-01-23: qty 1

## 2018-01-23 MED ORDER — ACETAMINOPHEN 325 MG PO TABS
650.0000 mg | ORAL_TABLET | Freq: Four times a day (QID) | ORAL | Status: DC | PRN
  Administered 2018-01-27 – 2018-01-28 (×2): 650 mg via ORAL
  Filled 2018-01-23 (×2): qty 2

## 2018-01-23 MED ORDER — ALBUTEROL SULFATE HFA 108 (90 BASE) MCG/ACT IN AERS: 1.0000 | INHALATION_SPRAY | Freq: Four times a day (QID) | RESPIRATORY_TRACT | Status: DC | PRN

## 2018-01-23 MED ORDER — ALBUTEROL SULFATE HFA 108 (90 BASE) MCG/ACT IN AERS
1.0000 | INHALATION_SPRAY | Freq: Four times a day (QID) | RESPIRATORY_TRACT | Status: DC | PRN
  Administered 2018-01-23 – 2018-01-26 (×3): 2 via RESPIRATORY_TRACT
  Filled 2018-01-23: qty 6.7

## 2018-01-23 MED ORDER — ALUM & MAG HYDROXIDE-SIMETH 200-200-20 MG/5ML PO SUSP: 30.0000 mL | ORAL | Status: DC | PRN

## 2018-01-23 MED ORDER — DICYCLOMINE HCL 20 MG PO TABS
20.0000 mg | ORAL_TABLET | Freq: Four times a day (QID) | ORAL | Status: DC | PRN
  Administered 2018-01-23: 20 mg via ORAL
  Filled 2018-01-23 (×2): qty 1

## 2018-01-23 MED ORDER — ONDANSETRON HCL 4 MG PO TABS
4.0000 mg | ORAL_TABLET | Freq: Three times a day (TID) | ORAL | Status: DC | PRN
  Administered 2018-01-23: 4 mg via ORAL
  Filled 2018-01-23: qty 1

## 2018-01-23 MED ORDER — TRAZODONE HCL 50 MG PO TABS: 50.0000 mg | ORAL_TABLET | Freq: Every evening | ORAL | Status: DC | PRN

## 2018-01-23 MED ORDER — INFLUENZA VAC SPLIT QUAD 0.5 ML IM SUSY
0.5000 mL | PREFILLED_SYRINGE | INTRAMUSCULAR | Status: AC
  Administered 2018-01-24: 0.5 mL via INTRAMUSCULAR
  Filled 2018-01-23: qty 0.5

## 2018-01-23 MED ORDER — HYDROXYZINE HCL 25 MG PO TABS
25.0000 mg | ORAL_TABLET | Freq: Four times a day (QID) | ORAL | Status: DC | PRN
  Administered 2018-01-24 – 2018-01-29 (×2): 25 mg via ORAL
  Filled 2018-01-23: qty 1
  Filled 2018-01-23: qty 10
  Filled 2018-01-23 (×3): qty 1

## 2018-01-23 MED ORDER — MIRTAZAPINE 15 MG PO TABS
15.0000 mg | ORAL_TABLET | Freq: Every day | ORAL | Status: DC
  Administered 2018-01-23 – 2018-01-26 (×4): 15 mg via ORAL
  Filled 2018-01-23 (×5): qty 1

## 2018-01-23 MED ORDER — BUPROPION HCL ER (XL) 150 MG PO TB24: 300.0000 mg | ORAL_TABLET | Freq: Every day | ORAL | Status: DC

## 2018-01-23 MED ORDER — CLONIDINE HCL 0.1 MG PO TABS
0.1000 mg | ORAL_TABLET | Freq: Four times a day (QID) | ORAL | Status: AC
  Administered 2018-01-23 – 2018-01-25 (×8): 0.1 mg via ORAL
  Filled 2018-01-23 (×12): qty 1

## 2018-01-23 MED ORDER — LOPERAMIDE HCL 2 MG PO CAPS: 2.0000 mg | ORAL_CAPSULE | ORAL | Status: DC | PRN

## 2018-01-23 MED ORDER — IBUPROFEN 600 MG PO TABS
600.0000 mg | ORAL_TABLET | Freq: Three times a day (TID) | ORAL | Status: DC | PRN
  Administered 2018-01-23 – 2018-01-24 (×2): 600 mg via ORAL
  Filled 2018-01-23 (×3): qty 1

## 2018-01-23 MED ORDER — MAGNESIUM HYDROXIDE 400 MG/5ML PO SUSP: 30.0000 mL | Freq: Every day | ORAL | Status: DC | PRN

## 2018-01-23 MED ORDER — HYDROXYZINE HCL 25 MG PO TABS: 25.0000 mg | ORAL_TABLET | Freq: Four times a day (QID) | ORAL | Status: DC | PRN

## 2018-01-23 MED ORDER — NICOTINE 21 MG/24HR TD PT24
21.0000 mg | MEDICATED_PATCH | Freq: Every day | TRANSDERMAL | Status: DC
  Administered 2018-01-23 – 2018-01-29 (×7): 21 mg via TRANSDERMAL
  Filled 2018-01-23 (×8): qty 1
  Filled 2018-01-23: qty 28

## 2018-01-23 MED ORDER — MIRTAZAPINE 15 MG PO TABS
15.0000 mg | ORAL_TABLET | Freq: Every day | ORAL | Status: DC
  Administered 2018-01-23: 15 mg via ORAL
  Filled 2018-01-23: qty 1

## 2018-01-23 NOTE — Progress Notes (Deleted)
Patient ID: Logan Hancock, male   DOB: [birth year], 28 y.o.   MRN: [id number]   after being seen in the ED for SI on 01/22/2018.  From ED note: 28 year old male with history of bipolar 1 disorder, drug addiction presents to the emergency department reporting suicidal ideations.  He was seen earlier in the day at [facility] without complaints of SI.  States that he has been having thoughts of overdosing on heroin.  Last use heroin at noon today.  Uses heroin IV almost daily.  Endorses increased stress from the loss of his mother in June.  Has a history of intentional overdose 2 years ago.  No HI, auditory or visual hallucinations.  Denies any other medical complaints at this time.  Last admitted to behavioral health in November 2018.

## 2018-01-23 NOTE — Plan of Care (Signed)
D: Patient presents irritable, anxious. He complains of tremors, chills, cravings, agitation, cramping, nausea, irritability and runny nose. He slept fair last night and received medication that was helpful. His appetite is good, energy low and concentration is poor. He rates his depression 5/10, and hopelessness and anxiety 6/10. He complains of pain at the site of his shoulder surgery in the joint tissue 7/10. Administered medication for pain. Patient denies SI/HI/AVH.  A: Administered several PRNs to treat withdrawal. Patient checked q15 min, and checks reviewed. Reviewed medication changes with patient and educated on side effects. Educated patient on importance of attending group therapy sessions and educated on several coping skills. Encouarged participation in milieu through recreation therapy and attending meals with peers. Support and encouragement provided. Fluids offered. R: Patient receptive to education on medications, and is medication compliant. Patient contracts for safety on the unit. Goal: "to go to groups"  Problem: Coping: Goal: Coping ability will improve Outcome: Progressing   Problem: Education: Goal: Emotional status will improve Outcome: Progressing Goal: Mental status will improve Outcome: Progressing   Problem: Activity: Goal: Interest or engagement in activities will improve Outcome: Progressing Goal: Sleeping patterns will improve Outcome: Progressing

## 2018-01-23 NOTE — [facility] Group Notes (Signed)
[facility] [employer] Group Therapy 01/23/2018 1:15pm  Type of Therapy: Mental [employer] Presentation  Participation Level: Pt invited. Chose to remain in bed.   Rona Ravens, LCSW 01/23/2018 1:45 PM

## 2018-01-23 NOTE — [facility] Suicide Risk Assessment (Signed)
[facility] INPATIENT:  Family/Significant Other Suicide Prevention Education  Suicide Prevention Education:  Patient Refusal for Family/Significant Other Suicide Prevention Education: The patient Logan Hancock has refused to provide written consent for family/significant other to be provided Family/Significant Other Suicide Prevention Education during admission and/or prior to discharge.  Physician notified.  Darreld Mclean 01/23/2018, 9:44 AM

## 2018-01-23 NOTE — ED Notes (Signed)
Voluntary consent faxed to [facility], copy placed in medical records-Monique,RN

## 2018-01-23 NOTE — ED Notes (Signed)
Pt belongings retreived from [facility] for transfer to [facility]

## 2018-01-23 NOTE — H&P (Signed)
Psychiatric Admission Assessment Adult  Patient Identification: Logan Hancock MRN:  [id number] Date of Evaluation:  01/23/2018 Chief Complaint:  MDD recurrent severe Opiod use disorder severe Principal Diagnosis: Severe recurrent major depression without psychotic features (HCC) Diagnosis:  Principal Problem:   Severe recurrent major depression without psychotic features (HCC) Active Problems:   Heroin use disorder, severe (HCC)  History of Present Illness: Mr. Pittard is a 28 year old male with history of depression, anxiety, heroin abuse, and asthma, presenting for treatment of suicidal ideation with plan to overdose on heroin. He is resting in bed this morning, irritable with assessment process and providing minimal answers. He has long history of heroin use since the age of 30. States he has been using 1/4 gram of heroin daily since he was released from prison last month. Denies ETOH or other drug use. UDS positive for opioids only. His mother died in 07-26-2022 and fiance died last month. He reports having thoughts of overdosing because he missed his mother, and his friend suggested he come to the hospital to detox and restart psychotropic medication. He was previously taking Wellbutrin but has been off this medication for weeks. Reports depressed mood for last several weeks with insomnia, fatigue, diminished appetite, anxiety, and suicidal ideation. Denies SI at this time. Denies HI, AVH, withdrawal symptoms.  Associated Signs/Symptoms: Depression Symptoms:  depressed mood, anhedonia, insomnia, loss of energy/fatigue, decreased appetite, (Hypo) Manic Symptoms:  Irritable Mood, Anxiety Symptoms:  Excessive Worry, Psychotic Symptoms:  denies PTSD Symptoms: denies Total Time spent with patient: 30 minutes  Past Psychiatric History: Reports heroin use since the age of 64. Reports four prior suicide attempts, with most recent about one year ago via O/D on heroin. Multiple hospitalizations, with  most recent after his last suicide attempt one year ago. Per chart review last hospitalized here November 2018 for SI with plan to O/D.  Is the patient at risk to self? Yes.    Has the patient been a risk to self in the past 6 months? Yes.    Has the patient been a risk to self within the distant past? Yes.    Is the patient a risk to others? No.  Has the patient been a risk to others in the past 6 months? No.  Has the patient been a risk to others within the distant past? No.   Prior Inpatient Therapy:   Prior Outpatient Therapy:    Alcohol Screening: 1. How often do you have a drink containing alcohol?: Never 2. How many drinks containing alcohol do you have on a typical day when you are drinking?: 1 or 2 3. How often do you have six or more drinks on one occasion?: Never AUDIT-C Score: 0 4. How often during the last year have you found that you were not able to stop drinking once you had started?: Never 5. How often during the last year have you failed to do what was normally expected from you becasue of drinking?: Never 6. How often during the last year have you needed a first drink in the morning to get yourself going after a heavy drinking session?: Never 7. How often during the last year have you had a feeling of guilt of remorse after drinking?: Never 8. How often during the last year have you been unable to remember what happened the night before because you had been drinking?: Never 9. Have you or someone else been injured as a result of your drinking?: No 10. Has a relative or friend  or a doctor or another health worker been concerned about your drinking or suggested you cut down?: No Alcohol Use Disorder Identification Test Final Score (AUDIT): 0 Intervention/Follow-up: AUDIT Score <7 follow-up not indicated Substance Abuse History in the last 12 months:  Yes.   Consequences of Substance Abuse: Legal Consequences:  recently released from prison Previous Psychotropic  Medications: Yes  Psychological Evaluations: No  Past Medical History:  Past Medical History:  Diagnosis Date  . Asthma   . Bipolar 1 disorder (HCC)   . Drug addiction (HCC)   . MRSA (methicillin resistant staph aureus) culture positive     Past Surgical History:  Procedure Laterality Date  . SHOULDER ARTHROSCOPY Right 12/28/2017   Procedure: ARTHROSCOPY SHOULDER  incision and drainage right shoulder and open incision and drainage shoulder joint;  Surgeon: Beverely Low, MD;  Location: [facility];  Service: Orthopedics;  Laterality: Right;  . TEE WITHOUT CARDIOVERSION N/A 01/09/2018   Procedure: TRANSESOPHAGEAL ECHOCARDIOGRAM (TEE);  Surgeon: Jake Bathe, MD;  Location: [facility] ENDOSCOPY;  Service: Cardiovascular;  Laterality: N/A;  with anesthesia   Family History: History reviewed. No pertinent family history. Family Psychiatric  History: Kateri Mc uses heroin. Father uses methamphetamine. Tobacco Screening: Have you used any form of tobacco in the last 30 days? (Cigarettes, Smokeless Tobacco, Cigars, and/or Pipes): Yes Tobacco use, Select all that apply: 5 or more cigarettes per day Are you interested in Tobacco Cessation Medications?: No, patient refused Counseled patient on smoking cessation including recognizing danger situations, developing coping skills and basic information about quitting provided: Refused/Declined practical counseling Social History:  Social History   Substance and Sexual Activity  Alcohol Use No  . Frequency: Never     Social History   Substance and Sexual Activity  Drug Use Yes  . Frequency: 3.0 times per week  . Types: Marijuana, Cocaine, IV   Comment: last crack use 01/18/17, pt states he has been using heroin for pain.    Additional Social History:                           Allergies:  No Known Allergies Lab Results:  Results for orders placed or performed during the hospital encounter of 01/22/18 (from the past 48 hour(s))  Rapid urine drug  screen (hospital performed)     Status: Abnormal   Collection Time: 01/22/18  8:00 PM  Result Value Ref Range   Opiates POSITIVE (A) NONE DETECTED   Cocaine NONE DETECTED NONE DETECTED   Benzodiazepines NONE DETECTED NONE DETECTED   Amphetamines NONE DETECTED NONE DETECTED   Tetrahydrocannabinol NONE DETECTED NONE DETECTED   Barbiturates NONE DETECTED NONE DETECTED    Comment: (NOTE) DRUG SCREEN FOR MEDICAL PURPOSES ONLY.  IF CONFIRMATION IS NEEDED FOR ANY PURPOSE, NOTIFY LAB WITHIN 5 DAYS. LOWEST DETECTABLE LIMITS FOR URINE DRUG SCREEN Drug Class                     Cutoff (ng/mL) Amphetamine and metabolites    1000 Barbiturate and metabolites    200 Benzodiazepine                 200 Tricyclics and metabolites     300 Opiates and metabolites        300 Cocaine and metabolites        300 THC  50 Performed at [facility], [address] [place], [place] [address]   Comprehensive metabolic panel     Status: Abnormal   Collection Time: 01/22/18  8:17 PM  Result Value Ref Range   Sodium 135 135 - 145 mmol/L   Potassium 3.8 3.5 - 5.1 mmol/L   Chloride 99 98 - 111 mmol/L   CO2 27 22 - 32 mmol/L   Glucose, Bld 66 (L) 70 - 99 mg/dL   BUN 8 6 - 20 mg/dL   Creatinine, Ser 6.040.83 0.61 - 1.24 mg/dL   Calcium 9.0 8.9 - 54.010.3 mg/dL   Total Protein 7.5 6.5 - 8.1 g/dL   Albumin 3.6 3.5 - 5.0 g/dL   AST 25 15 - 41 U/L   ALT 23 0 - 44 U/L   Alkaline Phosphatase 84 38 - 126 U/L   Total Bilirubin 1.2 0.3 - 1.2 mg/dL   GFR calc non Af Amer >60 >60 mL/min   GFR calc Af Amer >60 >60 mL/min   Anion gap 9 5 - 15    Comment: Performed at [facility], [address] [place], [place] [address]  Ethanol     Status: None   Collection Time: 01/22/18  8:17 PM  Result Value Ref Range   Alcohol, Ethyl (B) <10 <10 mg/dL    Comment: (NOTE) Lowest detectable limit for serum alcohol is 10 mg/dL. For medical purposes only. Performed at [facility],  [address] [place], [place] [address]   cbc     Status: None   Collection Time: 01/22/18  8:17 PM  Result Value Ref Range   WBC 9.4 4.0 - 10.5 K/uL   RBC 5.04 4.22 - 5.81 MIL/uL   Hemoglobin 13.4 13.0 - 17.0 g/dL   HCT 95.643.1 21.339.0 - 08.652.0 %   MCV 85.5 80.0 - 100.0 fL   MCH 26.6 26.0 - 34.0 pg   MCHC 31.1 30.0 - 36.0 g/dL   RDW 57.814.8 46.911.5 - 62.915.5 %   Platelets 268 150 - 400 K/uL   nRBC 0.0 0.0 - 0.2 %    Comment: Performed at [facility], [address] [place], [place] [address]  CBG monitoring, ED     Status: Abnormal   Collection Time: 01/22/18 10:49 PM  Result Value Ref Range   Glucose-Capillary 113 (H) 70 - 99 mg/dL   Comment 1 Notify RN    Comment 2 Document in Chart     Blood Alcohol level:  Lab Results  Component Value Date   ETH <10 01/22/2018   ETH <10 12/02/2016    Metabolic Disorder Labs:  No results found for: HGBA1C, MPG No results found for: PROLACTIN No results found for: CHOL, TRIG, HDL, CHOLHDL, VLDL, LDLCALC  Current Medications: Current Facility-Administered Medications  Medication Dose Route Frequency Provider Last Rate Last Dose  . acetaminophen (TYLENOL) tablet 650 mg  650 mg Oral Q6H PRN Nira ConnBerry, Jason A, NP      . albuterol (PROVENTIL HFA;VENTOLIN HFA) 108 (90 Base) MCG/ACT inhaler 1-2 puff  1-2 puff Inhalation Q6H PRN Nira ConnBerry, Jason A, NP   2 puff at 01/23/18 1316  . alum & mag hydroxide-simeth (MAALOX/MYLANTA) 200-200-20 MG/5ML suspension 30 mL  30 mL Oral Q4H PRN Nira ConnBerry, Jason A, NP      . buPROPion (WELLBUTRIN XL) 24 hr tablet 300 mg  300 mg Oral Daily Nira ConnBerry, Jason A, NP   300 mg at 01/23/18 0835  . hydrOXYzine (ATARAX/VISTARIL)  tablet 25 mg  25 mg Oral Q6H PRN Nira Conn A, NP      . ibuprofen (ADVIL,MOTRIN) tablet 600 mg  600 mg Oral Q8H PRN Jackelyn Poling, NP      . [START ON 01/24/2018] Influenza vac split quadrivalent PF (FLUARIX) injection 0.5 mL  0.5 mL Intramuscular Tomorrow-1000 Cobos, Fernando A, MD      . magnesium hydroxide (MILK  OF MAGNESIA) suspension 30 mL  30 mL Oral Daily PRN Nira Conn A, NP      . mirtazapine (REMERON) tablet 15 mg  15 mg Oral QHS Nira Conn A, NP      . nicotine (NICODERM CQ - dosed in mg/24 hours) patch 21 mg  21 mg Transdermal Daily Nira Conn A, NP   21 mg at 01/23/18 0836  . ondansetron (ZOFRAN) tablet 4 mg  4 mg Oral Q8H PRN Nira Conn A, NP      . traZODone (DESYREL) tablet 50 mg  50 mg Oral QHS PRN Jackelyn Poling, NP       PTA Medications: Medications Prior to Admission  Medication Sig Dispense Refill Last Dose  . albuterol (PROVENTIL HFA;VENTOLIN HFA) 108 (90 Base) MCG/ACT inhaler Inhale 1-2 puffs into the lungs every 6 (six) hours as needed for wheezing or shortness of breath. (Patient not taking: Reported on 01/22/2018) 1 Inhaler 0 Not Taking at Unknown time  . buPROPion (WELLBUTRIN XL) 300 MG 24 hr tablet Take 1 tablet (300 mg total) by mouth daily. For mood control (Patient not taking: Reported on 01/22/2018) 30 tablet 0 Not Taking at Unknown time  . cephALEXin (KEFLEX) 500 MG capsule Take 1 capsule (500 mg total) by mouth 4 (four) times daily. (Patient not taking: Reported on 01/22/2018) 120 capsule 0 Not Taking at Unknown time  . docusate sodium (COLACE) 100 MG capsule Take 1 capsule (100 mg total) by mouth 2 (two) times daily. (Patient not taking: Reported on 01/22/2018) 10 capsule 0 Not Taking at Unknown time  . hydrOXYzine (ATARAX/VISTARIL) 25 MG tablet Take 1 tablet (25 mg total) by mouth every 6 (six) hours as needed for anxiety. (Patient not taking: Reported on 01/22/2018) 10 tablet 0 Not Taking at Unknown time  . ibuprofen (ADVIL,MOTRIN) 400 MG tablet Take 1 tablet (400 mg total) by mouth every 6 (six) hours as needed for moderate pain. (Patient not taking: Reported on 01/22/2018) 30 tablet 0 Not Taking at Unknown time  . mirtazapine (REMERON) 15 MG tablet Take 1 tablet (15 mg total) by mouth at bedtime. (Patient not taking: Reported on 01/22/2018) 30 tablet 0 Not Taking at  Unknown time  . oxyCODONE (OXY IR/ROXICODONE) 5 MG immediate release tablet Take 1 tablet (5 mg total) by mouth every 4 (four) hours as needed for moderate pain (pain score 4-6). (Patient not taking: Reported on 01/22/2018) 15 tablet 0 Not Taking at Unknown time  . traZODone (DESYREL) 50 MG tablet Take 1 tablet (50 mg total) by mouth at bedtime as needed for sleep. (Patient not taking: Reported on 01/22/2018) 30 tablet 0 Not Taking at Unknown time    Musculoskeletal: Strength & Muscle Tone: within normal limits Gait & Station: normal Patient leans: N/A  Psychiatric Specialty Exam: Physical Exam  Nursing note and vitals reviewed. Constitutional: He is oriented to person, place, and time. He appears well-developed and well-nourished.  Cardiovascular: Normal rate.  Respiratory: Effort normal.  Neurological: He is alert and oriented to person, place, and time.    Review of Systems  Constitutional:  Negative.   Respiratory: Negative.   Cardiovascular: Negative.   Psychiatric/Behavioral: Positive for depression and substance abuse (heroin). Negative for hallucinations, memory loss and suicidal ideas. The patient is not nervous/anxious and does not have insomnia.     Blood pressure (!) 155/83, pulse 72, temperature 98.4 F (36.9 C), temperature source Oral, resp. rate 18, height 6\' 4"  (1.93 m), weight 108 kg.Body mass index is 28.97 kg/m.  See MD's admission SRA    Treatment Plan Summary: Daily contact with patient to assess and evaluate symptoms and progress in treatment and Medication management   Inpatient hospitalization  See MD's admission SRA for medication management  Patient will participate in the therapeutic group milieu.  Discharge disposition in progress.   Observation Level/Precautions:  15 minute checks  Laboratory:  Reviewed  Psychotherapy:  Group therapy  Medications:  See MAR  Consultations:  PRN  Discharge Concerns:  Safety and stabilization  Estimated LOS: 3-5  days  Other:     Physician Treatment Plan for Primary Diagnosis: Severe recurrent major depression without psychotic features (HCC) Long Term Goal(s): Improvement in symptoms so as ready for discharge  Short Term Goals: Ability to identify changes in lifestyle to reduce recurrence of condition will improve, Ability to verbalize feelings will improve and Ability to disclose and discuss suicidal ideas  Physician Treatment Plan for Secondary Diagnosis: Principal Problem:   Severe recurrent major depression without psychotic features (HCC) Active Problems:   Heroin use disorder, severe (HCC)  Long Term Goal(s): Improvement in symptoms so as ready for discharge  Short Term Goals: Ability to demonstrate self-control will improve, Ability to identify and develop effective coping behaviors will improve and Ability to identify triggers associated with substance abuse/mental health issues will improve  I certify that inpatient services furnished can reasonably be expected to improve the patient's condition.    Aldean Baker, NP 1/14/20201:17 PM

## 2018-01-23 NOTE — Progress Notes (Signed)
Recreation Therapy Notes  Animal-Assisted Activity (AAA) Program Checklist/Progress Notes Patient Eligibility Criteria Checklist & Daily Group note for Rec Tx Intervention  Date: 1.14.20 Time: 1430 Location: [address] Dayroom   AAA/T Program Assumption of Risk Form signed by Patient/ or Parent Legal Guardian  YES   Patient is free of allergies or sever asthma  YES   Patient reports no fear of animals  YES   Patient reports no history of cruelty to animals  YES   Patient understands his/her participation is voluntary  YES   Patient washes hands before animal contact  YES   Patient washes hands after animal contact  YES         Education: Hand Washing, Appropriate Animal Interaction   Education Outcome: Acknowledges understanding/In group clarification offered/Needs additional education.   Clinical Observations/Feedback: Pt did not attend group.    Angelisse Riso, LRT/CTRS         Courtney Bellizzi A 01/23/2018 3:42 PM 

## 2018-01-23 NOTE — ED Notes (Addendum)
TTS machine placed in Rm 25 for assessment-Monique,RN
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
## 2018-01-23 NOTE — Progress Notes (Signed)
Patient ID: Logan Hancock, male   DOB: 06-Sep-1990, 28 y.o.   MRN: [id number] Admission note: D:Patient is a  Voluntary admission in no acute distress for IV heroin abuse and SI. Pt reports he has been abusing heroin since discharge from jail on 12/22/2017. Pt reports he uses about half a gram daily. Pt had rt shoulder surgery about three weeks ago due to sepsis. Pt reports his stressor as losing his mother in July and girlfriend in December 2019. Pt reports not currently receiving outpatient services, no current medication, pt does not recall medications. Pt endorses past suicidal attempt. A: Pt admitted to unit per protocol, skin assessment and belonging search done. Scar red, tender, no discharge on rt shoulder. Consent signed by pt. Pt educated on therapeutic milieu rules. Pt was introduced to milieu by nursing staff. Fall risk / suicide safety plan explained to the patient. 15 minutes checks started for safety. R: Pt was receptive to education. Writer offered support.
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
## 2018-01-23 NOTE — ED Notes (Signed)
Pt's belongings has been located and inventoried and placed in locker 4 ; pt has valuables taken to [facility]

## 2018-01-23 NOTE — Tx Team (Signed)
Initial Treatment Plan 01/23/2018 4:03 AM Logan Hancock LYY:[id number]    PATIENT STRESSORS: Financial difficulties Health problems Medication change or noncompliance Substance abuse   PATIENT STRENGTHS: Ability for insight Average or above average intelligence Capable of independent living Communication skills Motivation for treatment/growth   PATIENT IDENTIFIED PROBLEMS:   depression  Suicidal ideation  Substance abuse  Medication noncompliant  "I need to get back on my medications"           DISCHARGE CRITERIA:  Ability to meet basic life and health needs Improved stabilization in mood, thinking, and/or behavior Medical problems require only outpatient monitoring Need for constant or close observation no longer present Verbal commitment to aftercare and medication compliance Withdrawal symptoms are absent or subacute and managed without 24-hour nursing intervention  PRELIMINARY DISCHARGE PLAN: Attend PHP/IOP Outpatient therapy Placement in alternative living arrangements  PATIENT/FAMILY INVOLVEMENT: This treatment plan has been presented to and reviewed with the patient, Logan Hancock,  The patient have been given the opportunity to ask questions and make suggestions.  JEHU-APPIAH, Salley Scarlet, RN 01/23/2018, 4:03 AM

## 2018-01-24 ENCOUNTER — Inpatient Hospital Stay: Payer: Self-pay

## 2018-01-24 MED ORDER — ZIPRASIDONE MESYLATE 20 MG IM SOLR
INTRAMUSCULAR | Status: AC
  Administered 2018-01-24: 20 mg via INTRAMUSCULAR
  Filled 2018-01-24: qty 20

## 2018-01-24 MED ORDER — ZIPRASIDONE MESYLATE 20 MG IM SOLR
20.0000 mg | Freq: Once | INTRAMUSCULAR | Status: AC
  Administered 2018-01-24: 20 mg via INTRAMUSCULAR
  Filled 2018-01-24: qty 20

## 2018-01-24 NOTE — Progress Notes (Signed)
Patient ID: Logan Hancock, male   DOB: 1991/01/07, 28 y.o.   MRN: [id number]  Pt currently presents with a irritable affect and guarded behavior. Pt is hesitant to come to medication window this morning states "can't you just bring it to me." Pt reports to writer that their goal is to "get switched to another room." Pt reports to writer verbally that he wants to go to long term treatment after discharge because he feels he will relapse if he goes back into the same situation. Pt makes frequent jokes about making something to use drugs with while on the unit. Pt reports good sleep with current medication regimen.   Pt provided with medications per providers orders. Pt's labs and vitals were monitored throughout the night. Pt supported emotionally and encouraged to express concerns and questions. Pt educated on medications and encouraged to think about and discuss discharge plans with CSW.   Pt's safety ensured with 15 minute and environmental checks. Pt currently denies SI/HI and A/V hallucinations. Pt verbally agrees to seek staff if SI/HI or A/VH occurs and to consult with staff before acting on any harmful thoughts. Pt had an episode this afternoon requiring injections. Will continue POC.

## 2018-01-24 NOTE — Progress Notes (Signed)
Psychoeducational Group Note  Date:  01/24/2018 Time:  2443  Group Topic/Focus:  Wrap-Up Group:   The focus of this group is to help patients review their daily goal of treatment and discuss progress on daily workbooks.  Participation Level: Did Not Attend  Participation Quality:  Not Applicable  Affect:  Not Applicable  Cognitive:  Not Applicable  Insight:  Not Applicable  Engagement in Group: Not Applicable  Additional Comments: The patient did not attend group since he was asleep at that time.   Hazle Coca S 01/24/2018, 12:43 AM

## 2018-01-24 NOTE — Therapy (Signed)
Occupational Therapy Group Note  Date:  01/24/2018 Time:  12:13 PM  Group Topic/Focus:  Stress Management  Participation Level:  Active  Participation Quality:  Appropriate  Affect:  Blunted  Cognitive:  Appropriate  Insight: Limited  Engagement in Group:  Engaged  Modes of Intervention:  Activity, Discussion, Education and Socialization  Additional Comments:    S: "I don't really have any healthy coping"  O: Stress management group completed to use as productive coping strategy, to help mitigate maladaptive coping to integrate in functional BADL/IADL. Stress management tool worksheet discussed to educate on unhealthy vs healthy coping skills to manage stress to improve community integration. Coping strategies taught include: relaxation based- deep breathing, counting to 10, taking a 1 minute vacation, acceptance, stress balls, relaxation audio/video, visual/mental imagery. Positive mental attitude- gratitude, acceptance, cognitive reframing, positive self talk, anger management.  A: Pt presents to group with blunted affect, mildly resistant to suggestions. After education on deep breathing, pt willing to try. Minimally engaged throughout rest of group. At end of group pt came up to nurses station yelling and banging fists that he needs to leave. RN staff alerted and pt now on unit restriction.  P: OT group will continue while pt acute.   Dalphine Handing, MSOT, OTR/L Behavioral Health OT/ Acute Relief OT PHP Office: [phone]  Dalphine Handing 01/24/2018, 12:13 PM

## 2018-01-24 NOTE — Progress Notes (Signed)
Patient ID: Logan Hancock, male   DOB: 02/08/1990, 28 y.o.   MRN: [id number]  Pt begins to curse and yell at the nurses station. "give me my f&*ing things I'm leaving." Pt was banging his fist on the counter. Refused to answer questions by this Clinical research associate. Pt was led to room by MHT where patient continued to banging on the walls and requesting to leave. MD notified and consulted with patient. 20 mg of IM Geodon ordered. Pt willingly received medication, tolerated well. Pt reports that he had been dating someone on the unit and they got into an argument. The other person was moved to another hall. Pt placed on unit restriction until MD orders it discontinued. Pt notified. Requested a prn medication for shoulder pain that was ongoing at admission. Will continue to monitor.

## 2018-01-24 NOTE — Progress Notes (Signed)
D  Pt is pleasant and cooperative   He interacts well with his peers and was supportive of his peer with breathing problems    A   Verbal support given   Medications administered and effectiveness monitored   Q 15 min checks  R   Pt is safe at this time

## 2018-01-24 NOTE — Progress Notes (Signed)
[facility] MD Progress Note  01/24/2018 10:33 AM Logan Hancock  MRN:  [id number] Subjective:  Patient is a 28 year old male who originally presented to the [facility] behavioral health hospital to have his sutures removed, but then requested to stay after his girlfriend left.  He stated that he had been thinking about overdosing on heroin, but did not want his girlfriend to know about it.   Objective: Patient is seen and examined.  Patient is a 28 year old male with a past psychiatric history significant for opiate dependence as well as major depression.  He is seen in follow-up.  He stated he is feeling all right, but still having some degree of problems.  He denied any suicidal ideation.  He stated he needed residential treatment, and also assistance in finding a job.  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  His vital signs are stable, he is afebrile.  He slept 6.5 hours last night.  He was also seen by treatment team today.  Principal Problem: Heroin use disorder, severe (HCC) Diagnosis: Principal Problem:   Heroin use disorder, severe (HCC) Active Problems:   Substance induced mood disorder (HCC)   Severe recurrent major depression without psychotic features (HCC)  Total Time spent with patient: 15 minutes  Past Psychiatric History: See admission H&P  Past Medical History:  Past Medical History:  Diagnosis Date  . Asthma   . Bipolar 1 disorder (HCC)   . Drug addiction (HCC)   . MRSA (methicillin resistant staph aureus) culture positive     Past Surgical History:  Procedure Laterality Date  . SHOULDER ARTHROSCOPY Right 12/28/2017   Procedure: ARTHROSCOPY SHOULDER  incision and drainage right shoulder and open incision and drainage shoulder joint;  Surgeon: Beverely Low, MD;  Location: [facility] ORS;   Service: Orthopedics;  Laterality: Right;  . TEE WITHOUT CARDIOVERSION N/A 01/09/2018   Procedure: TRANSESOPHAGEAL ECHOCARDIOGRAM (TEE);  Surgeon: Jake Bathe, MD;  Location: [facility] ENDOSCOPY;  Service: Cardiovascular;  Laterality: N/A;  with anesthesia   Family History: History reviewed. No pertinent family history. Family Psychiatric  History: See admission H&P Social History:  Social History   Substance and Sexual Activity  Alcohol Use No  . Frequency: Never     Social History   Substance and Sexual Activity  Drug Use Yes  . Frequency: 3.0 times per week  . Types: Marijuana, Cocaine, IV   Comment: last crack use 01/18/17, pt states he has been using heroin for pain.    Social History   Socioeconomic History  . Marital status: Single    Spouse name: Not on file  . Number of children: Not on file  . Years of education: Not on file  . Highest education level: Not on file  Occupational History  . Not on file  Social Needs  . Financial resource strain: Not on file  . Food insecurity:    Worry: Not on file    Inability: Not on file  . Transportation needs:    Medical: Not on file    Non-medical: Not on file  Tobacco Use  . Smoking status: Current Every Day Smoker    Packs/day: 1.00    Years: 2.00    Pack years: 2.00    Types: Cigarettes  . Smokeless tobacco: Never Used  Substance and Sexual Activity  . Alcohol use: No    Frequency: Never  . Drug use: Yes    Frequency: 3.0 times per week    Types: Marijuana, Cocaine, IV    Comment: last crack use 01/18/17, pt states he has been using heroin for pain.  Marland Kitchen Sexual activity: Yes    Birth control/protection: None  Lifestyle  . Physical activity:    Days per week: Not on file    Minutes per session: Not on file  . Stress: Not on file  Relationships  . Social connections:    Talks on phone: Not on file    Gets together: Not on file    Attends religious service: Not on file    Active member of club or organization: Not  on file    Attends meetings of clubs or organizations: Not on file    Relationship status: Not on file  Other Topics Concern  . Not on file  Social History Narrative  . Not on file   Additional Social History:                         Sleep: Good  Appetite:  Fair  Current Medications: Current Facility-Administered Medications  Medication Dose Route Frequency Provider Last Rate Last Dose  . acetaminophen (TYLENOL) tablet 650 mg  650 mg Oral Q6H PRN Nira Conn A, NP      . albuterol (PROVENTIL HFA;VENTOLIN HFA) 108 (90 Base) MCG/ACT inhaler 1-2 puff  1-2 puff Inhalation Q6H PRN Nira Conn A, NP   2 puff at 01/23/18 1316  . alum & mag hydroxide-simeth (MAALOX/MYLANTA) 200-200-20 MG/5ML suspension 30 mL  30 mL Oral Q4H PRN Nira Conn A, NP      . buPROPion (WELLBUTRIN XL) 24 hr tablet 300 mg  300 mg Oral Daily Nira Conn A, NP   300 mg at 01/24/18 0903  . cloNIDine (CATAPRES) tablet 0.1 mg  0.1 mg Oral QID Antonieta Pert, MD   0.1 mg at 01/24/18 0900   Followed by  . [START ON 01/26/2018] cloNIDine (CATAPRES) tablet 0.1 mg  0.1 mg Oral BH-qamhs Jeyda Siebel, Marlane Mingle, MD       Followed by  . [START ON 01/28/2018] cloNIDine (CATAPRES) tablet 0.1 mg  0.1 mg Oral QAC breakfast Antonieta Pert, MD      . dicyclomine (BENTYL) tablet 20 mg  20 mg Oral Q6H PRN Antonieta Pert, MD   20 mg at 01/23/18 2147  . hydrOXYzine (ATARAX/VISTARIL) tablet 25 mg  25 mg Oral Q6H PRN Nira Conn A, NP      . ibuprofen (ADVIL,MOTRIN) tablet 600 mg  600 mg Oral Q8H PRN Nira Conn A, NP   600 mg at 01/23/18 2147  . loperamide (IMODIUM) capsule 2-4 mg  2-4 mg Oral PRN Antonieta Pert, MD      . magnesium hydroxide (MILK OF MAGNESIA) suspension 30 mL  30 mL Oral Daily PRN Nira Conn A, NP      . methocarbamol (ROBAXIN) tablet 500 mg  500 mg Oral Q8H PRN Antonieta Pert, MD   500 mg at 01/23/18 2147  . mirtazapine (REMERON) tablet 15 mg  15 mg Oral QHS Nira Conn A, NP   15 mg at  01/23/18 2147  . nicotine (NICODERM CQ - dosed in mg/24 hours) patch 21 mg  21 mg Transdermal Daily Nira Conn A, NP   21 mg at 01/24/18 0904  . ondansetron (  ZOFRAN) tablet 4 mg  4 mg Oral Q8H PRN Nira ConnBerry, Jason A, NP   4 mg at 01/23/18 1557  . traZODone (DESYREL) tablet 50 mg  50 mg Oral QHS PRN Jackelyn PolingBerry, Jason A, NP        Lab Results:  Results for orders placed or performed during the hospital encounter of 01/22/18 (from the past 48 hour(s))  Rapid urine drug screen (hospital performed)     Status: Abnormal   Collection Time: 01/22/18  8:00 PM  Result Value Ref Range   Opiates POSITIVE (A) NONE DETECTED   Cocaine NONE DETECTED NONE DETECTED   Benzodiazepines NONE DETECTED NONE DETECTED   Amphetamines NONE DETECTED NONE DETECTED   Tetrahydrocannabinol NONE DETECTED NONE DETECTED   Barbiturates NONE DETECTED NONE DETECTED    Comment: (NOTE) DRUG SCREEN FOR MEDICAL PURPOSES ONLY.  IF CONFIRMATION IS NEEDED FOR ANY PURPOSE, NOTIFY LAB WITHIN 5 DAYS. LOWEST DETECTABLE LIMITS FOR URINE DRUG SCREEN Drug Class                     Cutoff (ng/mL) Amphetamine and metabolites    1000 Barbiturate and metabolites    200 Benzodiazepine                 200 Tricyclics and metabolites     300 Opiates and metabolites        300 Cocaine and metabolites        300 THC                            50 Performed at [facility], [address] [place], [place] [address]   Comprehensive metabolic panel     Status: Abnormal   Collection Time: 01/22/18  8:17 PM  Result Value Ref Range   Sodium 135 135 - 145 mmol/L   Potassium 3.8 3.5 - 5.1 mmol/L   Chloride 99 98 - 111 mmol/L   CO2 27 22 - 32 mmol/L   Glucose, Bld 66 (L) 70 - 99 mg/dL   BUN 8 6 - 20 mg/dL   Creatinine, Ser 6.040.83 0.61 - 1.24 mg/dL   Calcium 9.0 8.9 - 54.010.3 mg/dL   Total Protein 7.5 6.5 - 8.1 g/dL   Albumin 3.6 3.5 - 5.0 g/dL   AST 25 15 - 41 U/L   ALT 23 0 - 44 U/L   Alkaline Phosphatase 84 38 - 126 U/L   Total Bilirubin  1.2 0.3 - 1.2 mg/dL   GFR calc non Af Amer >60 >60 mL/min   GFR calc Af Amer >60 >60 mL/min   Anion gap 9 5 - 15    Comment: Performed at [facility], [address] [place], [place] [address]  Ethanol     Status: None   Collection Time: 01/22/18  8:17 PM  Result Value Ref Range   Alcohol, Ethyl (B) <10 <10 mg/dL    Comment: (NOTE) Lowest detectable limit for serum alcohol is 10 mg/dL. For medical purposes only. Performed at [facility], [address] [place], [place] [address]   cbc     Status: None   Collection Time: 01/22/18  8:17 PM  Result Value Ref Range   WBC 9.4 4.0 - 10.5 K/uL   RBC 5.04 4.22 - 5.81 MIL/uL   Hemoglobin 13.4 13.0 - 17.0 g/dL   HCT 95.643.1 21.339.0 - 08.652.0 %   MCV 85.5 80.0 - 100.0  fL   MCH 26.6 26.0 - 34.0 pg   MCHC 31.1 30.0 - 36.0 g/dL   RDW 16.1 09.6 - 04.5 %   Platelets 268 150 - 400 K/uL   nRBC 0.0 0.0 - 0.2 %    Comment: Performed at [facility], [address] [place], [place] [address]  CBG monitoring, ED     Status: Abnormal   Collection Time: 01/22/18 10:49 PM  Result Value Ref Range   Glucose-Capillary 113 (H) 70 - 99 mg/dL   Comment 1 Notify RN    Comment 2 Document in Chart     Blood Alcohol level:  Lab Results  Component Value Date   ETH <10 01/22/2018   ETH <10 12/02/2016    Metabolic Disorder Labs: No results found for: HGBA1C, MPG No results found for: PROLACTIN No results found for: CHOL, TRIG, HDL, CHOLHDL, VLDL, LDLCALC  Physical Findings: AIMS: Facial and Oral Movements Muscles of Facial Expression: None, normal Lips and Perioral Area: None, normal Jaw: None, normal Tongue: None, normal,Extremity Movements Upper (arms, wrists, hands, fingers): None, normal Lower (legs, knees, ankles, toes): None, normal, Trunk Movements Neck, shoulders, hips: None, normal, Overall Severity Severity of abnormal movements (highest score from questions above): None, normal Incapacitation due to abnormal movements:  None, normal Patient's awareness of abnormal movements (rate only patient's report): No Awareness, Dental Status Current problems with teeth and/or dentures?: No Does patient usually wear dentures?: No  CIWA:  CIWA-Ar Total: 1 COWS:  COWS Total Score: 5  Musculoskeletal: Strength & Muscle Tone: within normal limits Gait & Station: normal Patient leans: N/A  Psychiatric Specialty Exam: Physical Exam  Nursing note and vitals reviewed. Constitutional: He is oriented to person, place, and time. He appears well-developed and well-nourished.  HENT:  Head: Normocephalic and atraumatic.  Respiratory: Effort normal.  Neurological: He is alert and oriented to person, place, and time.    ROS  Blood pressure 112/63, pulse 97, temperature 97.6 F (36.4 C), temperature source Oral, resp. rate 18, height 6\' 4"  (1.93 m), weight 108 kg.Body mass index is 28.97 kg/m.  General Appearance: Casual  Eye Contact:  Fair  Speech:  Normal Rate  Volume:  Normal  Mood:  Anxious  Affect:  Congruent  Thought Process:  Coherent and Descriptions of Associations: Intact  Orientation:  Full (Time, Place, and Person)  Thought Content:  Logical  Suicidal Thoughts:  No  Homicidal Thoughts:  No  Memory:  Immediate;   Fair Recent;   Fair Remote;   Fair  Judgement:  Intact  Insight:  Lacking  Psychomotor Activity:  Increased  Concentration:  Concentration: Fair and Attention Span: Fair  Recall:  [employer] of Knowledge:  Fair  Language:  Fair  Akathisia:  Negative  Handed:  Right  AIMS (if indicated):     Assets:  Communication Skills Desire for Improvement Physical Health Resilience  ADL's:  Intact  Cognition:  WNL  Sleep:  Number of Hours: 6.5     Treatment Plan Summary: Daily contact with patient to assess and evaluate symptoms and progress in treatment, Medication management and Plan : Patient is seen and examined.  Patient is a 28 year old male with the above-stated past psychiatric history  who was admitted secondary to substance issues as well as suicidal ideation. Patient stated he is still in withdrawal, but doing better.  He denied suicidal ideation this morning.  He remains anxious.  His main desire for aftercare is housing and a job.  No  change in his current medications.  He is being considered for residential substance abuse treatment. 1.  Continue Wellbutrin XL 300 mg p.o. daily for mood and anxiety. 2.  Continue clonidine detox protocol for opiate dependence. 3.  Continue Bentyl 20 mg p.o. every 6 hours as needed spasm, abdominal cramping. 4.  Continue ibuprofen 600 mg every 8 hours as needed headache or pain. 5.  Continue Robaxin 500 mg every 8 hours as needed muscle spasms. 6.  Mirtazapine 15 mg p.o. nightly for mood, anxiety, sleep. 7.  Continue Zofran 4 mg every 8 hours as needed nausea. 8.  Continue trazodone 50 mg p.o. nightly as needed insomnia. 9.  Disposition planning-in progress.  Antonieta PertGreg Lawson Rivaldo Hineman, MD 01/24/2018, 10:33 AM

## 2018-01-24 NOTE — Progress Notes (Signed)
D: Patient observed resting in bed all evening. Awakened for assessment and patient's participation was minimal. Refused snack, fluids. Patient forwards little information when asked however did complain of abdominal cramping and leg discomfort rated at a 6/10. Patient's affect flat, mood depressed, irritable.   A: Medicated per orders, prn advil, robaxin, and bentyl given for complaints. Medication education provided. Level III obs in place for safety. Emotional support offered. Patient encouraged to complete Suicide Safety Plan before discharge. Encouraged to attend and participate in unit programming.  Fall prevention plan in place and reviewed with patient as pt is a high fall risk due to detox.   R: Patient verbalizes understanding of POC, falls prevention education. On reassess, patient was asleep. Patient denies SI/HI/AVH and remains safe on level III obs. Will continue to monitor throughout the night.

## 2018-01-24 NOTE — Tx Team (Signed)
Interdisciplinary Treatment and Diagnostic Plan Update  01/24/2018 Time of Session: [id number] Logan Hancock MRN: [id number]  Principal Diagnosis: Heroin use disorder, severe (HCC)  Secondary Diagnoses: Principal Problem:   Heroin use disorder, severe (HCC) Active Problems:   Substance induced mood disorder (HCC)   Severe recurrent major depression without psychotic features (HCC)   Current Medications:  Current Facility-Administered Medications  Medication Dose Route Frequency Provider Last Rate Last Dose  . ziprasidone (GEODON) 20 MG injection           . acetaminophen (TYLENOL) tablet 650 mg  650 mg Oral Q6H PRN Nira ConnBerry, Jason A, NP      . albuterol (PROVENTIL HFA;VENTOLIN HFA) 108 (90 Base) MCG/ACT inhaler 1-2 puff  1-2 puff Inhalation Q6H PRN Nira ConnBerry, Jason A, NP   2 puff at 01/23/18 1316  . alum & mag hydroxide-simeth (MAALOX/MYLANTA) 200-200-20 MG/5ML suspension 30 mL  30 mL Oral Q4H PRN Nira ConnBerry, Jason A, NP      . buPROPion (WELLBUTRIN XL) 24 hr tablet 300 mg  300 mg Oral Daily Nira ConnBerry, Jason A, NP   300 mg at 01/24/18 0903  . cloNIDine (CATAPRES) tablet 0.1 mg  0.1 mg Oral QID Antonieta Pertlary, Greg Lawson, MD   0.1 mg at 01/24/18 0900   Followed by  . [START ON 01/26/2018] cloNIDine (CATAPRES) tablet 0.1 mg  0.1 mg Oral BH-qamhs Clary, Marlane MingleGreg Lawson, MD       Followed by  . [START ON 01/28/2018] cloNIDine (CATAPRES) tablet 0.1 mg  0.1 mg Oral QAC breakfast Antonieta Pertlary, Greg Lawson, MD      . dicyclomine (BENTYL) tablet 20 mg  20 mg Oral Q6H PRN Antonieta Pertlary, Greg Lawson, MD   20 mg at 01/23/18 2147  . hydrOXYzine (ATARAX/VISTARIL) tablet 25 mg  25 mg Oral Q6H PRN Nira ConnBerry, Jason A, NP      . ibuprofen (ADVIL,MOTRIN) tablet 600 mg  600 mg Oral Q8H PRN Nira ConnBerry, Jason A, NP   600 mg at 01/23/18 2147  . loperamide (IMODIUM) capsule 2-4 mg  2-4 mg Oral PRN Antonieta Pertlary, Greg Lawson, MD      . magnesium hydroxide (MILK OF MAGNESIA) suspension 30 mL  30 mL Oral Daily PRN Nira ConnBerry, Jason A, NP      . methocarbamol (ROBAXIN) tablet 500  mg  500 mg Oral Q8H PRN Antonieta Pertlary, Greg Lawson, MD   500 mg at 01/23/18 2147  . mirtazapine (REMERON) tablet 15 mg  15 mg Oral QHS Nira ConnBerry, Jason A, NP   15 mg at 01/23/18 2147  . nicotine (NICODERM CQ - dosed in mg/24 hours) patch 21 mg  21 mg Transdermal Daily Nira ConnBerry, Jason A, NP   21 mg at 01/24/18 0904  . ondansetron (ZOFRAN) tablet 4 mg  4 mg Oral Q8H PRN Nira ConnBerry, Jason A, NP   4 mg at 01/23/18 1557  . traZODone (DESYREL) tablet 50 mg  50 mg Oral QHS PRN Nira ConnBerry, Jason A, NP      . ziprasidone (GEODON) injection 20 mg  20 mg Intramuscular Once Antonieta Pertlary, Greg Lawson, MD       PTA Medications: Medications Prior to Admission  Medication Sig Dispense Refill Last Dose  . albuterol (PROVENTIL HFA;VENTOLIN HFA) 108 (90 Base) MCG/ACT inhaler Inhale 1-2 puffs into the lungs every 6 (six) hours as needed for wheezing or shortness of breath. (Patient not taking: Reported on 01/22/2018) 1 Inhaler 0 Not Taking at Unknown time  . buPROPion (WELLBUTRIN XL) 300 MG 24 hr tablet Take 1 tablet (300 mg total)  by mouth daily. For mood control (Patient not taking: Reported on 01/22/2018) 30 tablet 0 Not Taking at Unknown time  . cephALEXin (KEFLEX) 500 MG capsule Take 1 capsule (500 mg total) by mouth 4 (four) times daily. (Patient not taking: Reported on 01/22/2018) 120 capsule 0 Not Taking at Unknown time  . docusate sodium (COLACE) 100 MG capsule Take 1 capsule (100 mg total) by mouth 2 (two) times daily. (Patient not taking: Reported on 01/22/2018) 10 capsule 0 Not Taking at Unknown time  . hydrOXYzine (ATARAX/VISTARIL) 25 MG tablet Take 1 tablet (25 mg total) by mouth every 6 (six) hours as needed for anxiety. (Patient not taking: Reported on 01/22/2018) 10 tablet 0 Not Taking at Unknown time  . ibuprofen (ADVIL,MOTRIN) 400 MG tablet Take 1 tablet (400 mg total) by mouth every 6 (six) hours as needed for moderate pain. (Patient not taking: Reported on 01/22/2018) 30 tablet 0 Not Taking at Unknown time  . mirtazapine (REMERON) 15 MG  tablet Take 1 tablet (15 mg total) by mouth at bedtime. (Patient not taking: Reported on 01/22/2018) 30 tablet 0 Not Taking at Unknown time  . oxyCODONE (OXY IR/ROXICODONE) 5 MG immediate release tablet Take 1 tablet (5 mg total) by mouth every 4 (four) hours as needed for moderate pain (pain score 4-6). (Patient not taking: Reported on 01/22/2018) 15 tablet 0 Not Taking at Unknown time  . traZODone (DESYREL) 50 MG tablet Take 1 tablet (50 mg total) by mouth at bedtime as needed for sleep. (Patient not taking: Reported on 01/22/2018) 30 tablet 0 Not Taking at Unknown time    Patient Stressors: Financial difficulties Health problems Medication change or noncompliance Substance abuse  Patient Strengths: Ability for insight Average or above average intelligence Capable of independent living Communication skills Motivation for treatment/growth  Treatment Modalities: Medication Management, Group therapy, Case management,  1 to 1 session with clinician, Psychoeducation, Recreational therapy.   Physician Treatment Plan for Primary Diagnosis: Heroin use disorder, severe (HCC) Long Term Goal(s): Improvement in symptoms so as ready for discharge Improvement in symptoms so as ready for discharge   Short Term Goals: Ability to identify changes in lifestyle to reduce recurrence of condition will improve Ability to verbalize feelings will improve Ability to disclose and discuss suicidal ideas Ability to demonstrate self-control will improve Ability to identify and develop effective coping behaviors will improve Ability to identify triggers associated with substance abuse/mental health issues will improve  Medication Management: Evaluate patient's response, side effects, and tolerance of medication regimen.  Therapeutic Interventions: 1 to 1 sessions, Unit Group sessions and Medication administration.  Evaluation of Outcomes: Progressing  Physician Treatment Plan for Secondary Diagnosis: Principal  Problem:   Heroin use disorder, severe (HCC) Active Problems:   Substance induced mood disorder (HCC)   Severe recurrent major depression without psychotic features (HCC)  Long Term Goal(s): Improvement in symptoms so as ready for discharge Improvement in symptoms so as ready for discharge   Short Term Goals: Ability to identify changes in lifestyle to reduce recurrence of condition will improve Ability to verbalize feelings will improve Ability to disclose and discuss suicidal ideas Ability to demonstrate self-control will improve Ability to identify and develop effective coping behaviors will improve Ability to identify triggers associated with substance abuse/mental health issues will improve     Medication Management: Evaluate patient's response, side effects, and tolerance of medication regimen.  Therapeutic Interventions: 1 to 1 sessions, Unit Group sessions and Medication administration.  Evaluation of Outcomes: Progressing  RN Treatment Plan for Primary Diagnosis: Heroin use disorder, severe (HCC) Long Term Goal(s): Knowledge of disease and therapeutic regimen to maintain health will improve  Short Term Goals: Ability to verbalize frustration and anger appropriately will improve, Ability to demonstrate self-control, Ability to verbalize feelings will improve and Ability to identify and develop effective coping behaviors will improve  Medication Management: RN will administer medications as ordered by provider, will assess and evaluate patient's response and provide education to patient for prescribed medication. RN will report any adverse and/or side effects to prescribing provider.  Therapeutic Interventions: 1 on 1 counseling sessions, Psychoeducation, Medication administration, Evaluate responses to treatment, Monitor vital signs and CBGs as ordered, Perform/monitor CIWA, COWS, AIMS and Fall Risk screenings as ordered, Perform wound care treatments as ordered.  Evaluation  of Outcomes: Progressing   LCSW Treatment Plan for Primary Diagnosis: Heroin use disorder, severe (HCC) Long Term Goal(s): Safe transition to appropriate next level of care at discharge, Engage patient in therapeutic group addressing interpersonal concerns.  Short Term Goals: Engage patient in aftercare planning with referrals and resources, Increase emotional regulation, Facilitate patient progression through stages of change regarding substance use diagnoses and concerns and Increase skills for wellness and recovery  Therapeutic Interventions: Assess for all discharge needs, 1 to 1 time with Social worker, Explore available resources and support systems, Assess for adequacy in community support network, Educate family and significant other(s) on suicide prevention, Complete Psychosocial Assessment, Interpersonal group therapy.  Evaluation of Outcomes: Progressing   Progress in Treatment: Attending groups: Yes. Participating in groups: Yes. Taking medication as prescribed: Yes. Toleration medication: Yes. Family/Significant other contact made: SPE completed with pt; pt declined to consent to collateral contact.  Patient understands diagnosis: Yes. Discussing patient identified problems/goals with staff: Yes. Medical problems stabilized or resolved: Yes. Denies suicidal/homicidal ideation: Yes. Issues/concerns per patient self-inventory: No. Other: n/a  New problem(s) identified: No, Describe:  n/a  New Short Term/Long Term Goal(s): detox, medication management for mood stabilization; elimination of SI thoughts; development of comprehensive mental wellness/sobriety plan.   Patient Goals:  "to get off heroin and try to get into [facility]. Help with housing and some type of employment."   Discharge Plan or Barriers: CSW assessing for appropriate referrals. Pt has [facility] screening on Wed 1/22; he is requesting [facility] referral. [facility] appt made. [facility] pamphlet, Mobile Crisis information, and  AA/NA information provided to patient for additional community support and resources.   Reason for Continuation of Hospitalization: Anxiety Depression Medication stabilization Withdrawal symptoms Other; describe agitation on the unit. (emotional disregulation)  Estimated Length of Stay: Friday, 01/26/2018  Attendees: Patient: Logan Hancock 01/24/2018 11:23 AM  Physician: Dr. Jola Babinskilary MD 01/24/2018 11:23 AM  Nursing: Huntley DecSara RN; Marchelle Folksmanda RN 01/24/2018 11:23 AM  RN Care Manager:x 01/24/2018 11:23 AM  Social Worker: Corrie MckusickHeather Jabre Heo LCSW 01/24/2018 11:23 AM  Recreational Therapist: x 01/24/2018 11:23 AM  Other: Armandina StammerAgnes Nwoko NP; Marciano SequinJanet Sykes NP 01/24/2018 11:23 AM  Other:  01/24/2018 11:23 AM  Other: 01/24/2018 11:23 AM    Scribe for Treatment Team: Rona RavensHeather S Travis Purk, LCSW 01/24/2018 11:23 AM

## 2018-01-24 NOTE — Progress Notes (Signed)
Recreation Therapy Notes  Date: 1.15. 20 Time: 0930 Location: [address] Dayroom  Group Topic: Stress Management  Goal Area(s) Addresses:  Patient will identify stress management techniques. Patient will identify benefits of using stress management post d/c.  Behavioral Response: Engaged  Intervention: Stress Management  Activity :  Guided Imagery.  LRT introduced the stress management technique of guided imagery.  LRT read a script that lead the patients on a mental vacation to the beach at sunrise.  Patients were to listen and follow along as LRT read script.  Education:  Stress Management, Discharge Planning.   Education Outcome: Acknowledges Education  Clinical Observations/Feedback:  Pt attended and participated in group.     Caroll Rancher, LRT/CTRS         Caroll Rancher A 01/24/2018 10:58 AM

## 2018-01-24 NOTE — Progress Notes (Signed)
Patient did attend the evening speaker NA meeting.  

## 2018-01-25 ENCOUNTER — Ambulatory Visit (HOSPITAL_COMMUNITY)
Admission: AD | Admit: 2018-01-25 | Discharge: 2018-01-25 | Disposition: A | Discharge status: Home / Self Care | Payer: Federal, State, Local not specified - Other | Source: Intra-hospital | Attending: Psychiatry | Admitting: Psychiatry

## 2018-01-25 MED ORDER — IBUPROFEN 800 MG PO TABS
800.0000 mg | ORAL_TABLET | Freq: Three times a day (TID) | ORAL | Status: DC | PRN
  Administered 2018-01-27: 800 mg via ORAL
  Filled 2018-01-25: qty 1

## 2018-01-25 MED ORDER — GABAPENTIN 100 MG PO CAPS
200.0000 mg | ORAL_CAPSULE | Freq: Three times a day (TID) | ORAL | Status: DC
  Administered 2018-01-25 – 2018-01-26 (×3): 200 mg via ORAL
  Filled 2018-01-25 (×7): qty 2

## 2018-01-25 MED ORDER — SULFAMETHOXAZOLE-TRIMETHOPRIM 800-160 MG PO TABS
1.0000 | ORAL_TABLET | Freq: Two times a day (BID) | ORAL | Status: DC
  Administered 2018-01-25 – 2018-01-29 (×8): 1 via ORAL
  Filled 2018-01-25 (×2): qty 1
  Filled 2018-01-25: qty 6
  Filled 2018-01-25 (×2): qty 1
  Filled 2018-01-25: qty 6
  Filled 2018-01-25 (×8): qty 1

## 2018-01-25 MED ORDER — KETOROLAC TROMETHAMINE 60 MG/2ML IM SOLN
60.0000 mg | Freq: Once | INTRAMUSCULAR | Status: AC
  Administered 2018-01-25: 60 mg via INTRAMUSCULAR
  Filled 2018-01-25 (×2): qty 2

## 2018-01-25 MED ORDER — PANTOPRAZOLE SODIUM 40 MG PO TBEC
40.0000 mg | DELAYED_RELEASE_TABLET | Freq: Every day | ORAL | Status: DC
  Administered 2018-01-25 – 2018-01-29 (×5): 40 mg via ORAL
  Filled 2018-01-25 (×3): qty 1
  Filled 2018-01-25: qty 30
  Filled 2018-01-25 (×4): qty 1

## 2018-01-25 NOTE — [facility] Group Notes (Signed)
LCSW Group Therapy Note 01/25/2018 2:36 PM  Type of Therapy and Topic: Group Therapy: DBT House  Participation Level: Active   Description of Group:  In this group patients will be encouraged to explore  their values, behaviors they want to change, emotions they wish to increase, protective factors, supports, coping skills, and motivational factors. They will be guided to discuss their thoughts, feelings, and behaviors related to these obstacles. The group will be asked to individually process the activity and share their insights with the group. This group will be process-oriented, with patients participating in exploration of their own experiences as well as giving and receiving support and challenge from other group members.   Therapeutic Goals: 1. Patient will identify their values 2. Patient will identify behaviors they wish to modify  3. Patient will identify feelings and emotions they wish to increase 4. Patient will identify strengths, supports, protective factors 5. Patient will identify coping skills 6. Patient will identify goals and motivating factors for change   Summary of Patient Progress Patient participated in a guided deep breathing exercise and completed a DBT worksheet. Patient shared that he considers God to be his protector and declined to share further.  Therapeutic Modalities:  Dialectical Behavioral Therapy  Motivational Interviewing Relapse Prevention Therapy
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
## 2018-01-25 NOTE — Progress Notes (Signed)
Patient ID: Logan Hancock, male   DOB: 03-06-1990, 28 y.o.   MRN: [id number]  Nursing Progress Note [id number]  Data: Patient initially irritable this morning but does appear to be improved in mood. Patient is seen attending groups and visible in the milieu. Patient provided medication per MDs orders. Patient currently denies SI/HI/AVH.   Action: Patient is educated about and provided medication per provider's orders. Patient safety maintained with q15 min safety checks and frequent rounding. High fall risk precautions in place. Emotional support given. 1:1 interaction and active listening provided. Patient encouraged to attend meals, groups, and work on treatment plan and goals. Labs, vital signs and patient behavior monitored throughout shift.   Response: Patient remains safe on the unit at this time and agrees to come to staff with any issues/concerns. Patient is interacting with peers appropriately on the unit. Will continue to support and monitor.

## 2018-01-25 NOTE — Progress Notes (Signed)
OT Cancellation Note  Patient Details Name: Logan Hancock MRN: [id number] DOB: 08-03-1990   Cancelled Treatment:    Reason Eval/Treat Not Completed: Other (comment) Order received, pt chart reviewed. Met with pt who is ~one month post I&D arthroscopy. Noted specific precautions from previous encounter. Will call and confirm with surgeon prior to initiating further OT services.  Zenovia Jarred, MSOT, OTR/L Behavioral Health OT/ Acute Relief OT PHP Office: [place] [facility] [place] 01/25/2018, 4:46 PM

## 2018-01-26 MED ORDER — TRAZODONE HCL 100 MG PO TABS
100.0000 mg | ORAL_TABLET | Freq: Every evening | ORAL | Status: DC | PRN
  Administered 2018-01-26: 100 mg via ORAL
  Filled 2018-01-26: qty 1
  Filled 2018-01-26: qty 10

## 2018-01-26 MED ORDER — GABAPENTIN 300 MG PO CAPS
300.0000 mg | ORAL_CAPSULE | Freq: Three times a day (TID) | ORAL | Status: DC
  Administered 2018-01-26 – 2018-01-28 (×7): 300 mg via ORAL
  Filled 2018-01-26 (×12): qty 1

## 2018-01-26 MED ORDER — BENZONATATE 100 MG PO CAPS: 200.0000 mg | ORAL_CAPSULE | Freq: Three times a day (TID) | ORAL | Status: DC | PRN

## 2018-01-26 NOTE — Progress Notes (Signed)
Recreation Therapy Notes  Date: 1.17.20 Time: 0930 Location: [address] Dayroom  Group Topic: Stress Management  Goal Area(s) Addresses:  Patient will identify stress management techniques. Patient will identify benefit of using stress management post d/c.  Behavioral Response: Engaged  Intervention:  Stress Management  Activity :  Progressive Muscle Relaxation.  LRT introduced the stress management technique of progressive muscle relaxation.  LRT lead the group in tensing and relaxing each muscle individually.  Patients were to follow along as scrip was read to engage in activity.  Education:  Stress Management, Discharge Planning.   Education Outcome: Acknowledges Education  Clinical Observations/Feedback: Pt attended and participated in group.     Caroll RancherMarjette Danelle Curiale, LRT/CTRS         Caroll RancherLindsay, Tyrin Herbers A 01/26/2018 11:33 AM

## 2018-01-26 NOTE — Plan of Care (Signed)
  Problem: Coping: Goal: Coping ability will improve Outcome: Progressing   Problem: Health Behavior/Discharge Planning: Goal: Identification of resources available to assist in meeting health care needs will improve Outcome: Progressing   Problem: Medication: Goal: Compliance with prescribed medication regimen will improve Outcome: Progressing   Problem: Self-Concept: Goal: Ability to disclose and discuss suicidal ideas will improve Outcome: Progressing Goal: Will verbalize positive feelings about self Outcome: Progressing

## 2018-01-26 NOTE — Progress Notes (Signed)
[facility] MD Progress Note  01/26/2018 9:52 AM Logan Hancock  MRN:  [id number] Subjective:  Patient is a 28 year old male who originally presented to the [facility] behavioral health hospital to have his sutures removed, but then requested to stay after his girlfriend left. He stated that he had been thinking about overdosing on heroin, but did not want his girlfriend to know about it.  Objective: Patient is seen and examined. Patient is a 28 year old male with a past psychiatric history significant for opiate dependence as well as major depression. He is seen in follow-up.    He is essentially unchanged.  He remains irritable.  His vital signs are stable.  He is afebrile.  He slept 5.75 hours last night.  He is attempting to get into a residential substance abuse treatment program.  He is now asked for a referral to a [facility], but that is where his girlfriend with whom he had the ocean all interaction with on the ward the other day was going to.  We discussed with him the fact that it would not be a good thing for him to be referred to that facility given what it occurred here, and we are still working on other options.  His pain is essentially unchanged.  He was seen by Occupational Therapy yesterday, and she is going to contact orthopedic surgery today to see about any other recommendations.  He denied any suicidal ideation.  Principal Problem: Heroin use disorder, severe (HCC) Diagnosis: Principal Problem:   Heroin use disorder, severe (HCC) Active Problems:   Substance induced mood disorder (HCC)   Severe recurrent major depression without psychotic features (HCC)  Total Time spent with patient: 15 minutes  Past Psychiatric History: See admission H&P  Past Medical History:  Past Medical History:  Diagnosis Date  . Asthma   . Bipolar 1 disorder (HCC)   . Drug addiction (HCC)   . MRSA (methicillin resistant staph aureus) culture positive     Past Surgical History:  Procedure Laterality Date   . SHOULDER ARTHROSCOPY Right 12/28/2017   Procedure: ARTHROSCOPY SHOULDER  incision and drainage right shoulder and open incision and drainage shoulder joint;  Surgeon: Beverely Low, MD;  Location: [facility];  Service: Orthopedics;  Laterality: Right;  . TEE WITHOUT CARDIOVERSION N/A 01/09/2018   Procedure: TRANSESOPHAGEAL ECHOCARDIOGRAM (TEE);  Surgeon: Jake Bathe, MD;  Location: [facility] ENDOSCOPY;  Service: Cardiovascular;  Laterality: N/A;  with anesthesia   Family History: History reviewed. No pertinent family history. Family Psychiatric  History: See admission H&P Social History:  Social History   Substance and Sexual Activity  Alcohol Use No  . Frequency: Never     Social History   Substance and Sexual Activity  Drug Use Yes  . Frequency: 3.0 times per week  . Types: Marijuana, Cocaine, IV   Comment: last crack use 01/18/17, pt states he has been using heroin for pain.    Social History   Socioeconomic History  . Marital status: Single    Spouse name: Not on file  . Number of children: Not on file  . Years of education: Not on file  . Highest education level: Not on file  Occupational History  . Not on file  Social Needs  . Financial resource strain: Not on file  . Food insecurity:    Worry: Not on file    Inability: Not on file  . Transportation needs:    Medical: Not on file    Non-medical: Not on file  Tobacco Use  . Smoking status: Current Every Day Smoker    Packs/day: 1.00    Years: 2.00    Pack years: 2.00    Types: Cigarettes  . Smokeless tobacco: Never Used  Substance and Sexual Activity  . Alcohol use: No    Frequency: Never  . Drug use: Yes    Frequency: 3.0 times per week    Types: Marijuana, Cocaine, IV    Comment: last crack use 01/18/17, pt states he has been using heroin for pain.  Marland Kitchen Sexual activity: Yes    Birth control/protection: None  Lifestyle  . Physical activity:    Days per week: Not on file    Minutes per session: Not on file  .  Stress: Not on file  Relationships  . Social connections:    Talks on phone: Not on file    Gets together: Not on file    Attends religious service: Not on file    Active member of club or organization: Not on file    Attends meetings of clubs or organizations: Not on file    Relationship status: Not on file  Other Topics Concern  . Not on file  Social History Narrative  . Not on file   Additional Social History:                         Sleep: Fair  Appetite:  Fair  Current Medications: Current Facility-Administered Medications  Medication Dose Route Frequency Provider Last Rate Last Dose  . acetaminophen (TYLENOL) tablet 650 mg  650 mg Oral Q6H PRN Nira Conn A, NP      . albuterol (PROVENTIL HFA;VENTOLIN HFA) 108 (90 Base) MCG/ACT inhaler 1-2 puff  1-2 puff Inhalation Q6H PRN Nira Conn A, NP   2 puff at 01/24/18 2305  . alum & mag hydroxide-simeth (MAALOX/MYLANTA) 200-200-20 MG/5ML suspension 30 mL  30 mL Oral Q4H PRN Nira Conn A, NP      . buPROPion (WELLBUTRIN XL) 24 hr tablet 300 mg  300 mg Oral Daily Nira Conn A, NP   300 mg at 01/26/18 0754  . cloNIDine (CATAPRES) tablet 0.1 mg  0.1 mg Oral BH-qamhs Antonieta Pert, MD   0.1 mg at 01/26/18 1700   Followed by  . [START ON 01/28/2018] cloNIDine (CATAPRES) tablet 0.1 mg  0.1 mg Oral QAC breakfast Antonieta Pert, MD      . dicyclomine (BENTYL) tablet 20 mg  20 mg Oral Q6H PRN Antonieta Pert, MD   20 mg at 01/23/18 2147  . gabapentin (NEURONTIN) capsule 200 mg  200 mg Oral TID Antonieta Pert, MD   200 mg at 01/26/18 0754  . hydrOXYzine (ATARAX/VISTARIL) tablet 25 mg  25 mg Oral Q6H PRN Nira Conn A, NP   25 mg at 01/24/18 2145  . ibuprofen (ADVIL,MOTRIN) tablet 800 mg  800 mg Oral Q8H PRN Antonieta Pert, MD      . loperamide (IMODIUM) capsule 2-4 mg  2-4 mg Oral PRN Antonieta Pert, MD      . magnesium hydroxide (MILK OF MAGNESIA) suspension 30 mL  30 mL Oral Daily PRN Nira Conn A,  NP      . methocarbamol (ROBAXIN) tablet 500 mg  500 mg Oral Q8H PRN Antonieta Pert, MD   500 mg at 01/25/18 0815  . mirtazapine (REMERON) tablet 15 mg  15 mg Oral QHS Nira Conn A, NP   15 mg at  01/25/18 2113  . nicotine (NICODERM CQ - dosed in mg/24 hours) patch 21 mg  21 mg Transdermal Daily Nira ConnBerry, Jason A, NP   21 mg at 01/26/18 0753  . ondansetron (ZOFRAN) tablet 4 mg  4 mg Oral Q8H PRN Nira ConnBerry, Jason A, NP   4 mg at 01/23/18 1557  . pantoprazole (PROTONIX) EC tablet 40 mg  40 mg Oral Daily Antonieta Pertlary, Juan Olthoff Lawson, MD   40 mg at 01/26/18 0754  . sulfamethoxazole-trimethoprim (BACTRIM DS,SEPTRA DS) 800-160 MG per tablet 1 tablet  1 tablet Oral Q12H Antonieta Pertlary, Clista Rainford Lawson, MD   1 tablet at 01/26/18 0754  . traZODone (DESYREL) tablet 50 mg  50 mg Oral QHS PRN Jackelyn PolingBerry, Jason A, NP        Lab Results: No results found for this or any previous visit (from the past 48 hour(s)).  Blood Alcohol level:  Lab Results  Component Value Date   ETH <10 01/22/2018   ETH <10 12/02/2016    Metabolic Disorder Labs: No results found for: HGBA1C, MPG No results found for: PROLACTIN No results found for: CHOL, TRIG, HDL, CHOLHDL, VLDL, LDLCALC  Physical Findings: AIMS: Facial and Oral Movements Muscles of Facial Expression: None, normal Lips and Perioral Area: None, normal Jaw: None, normal Tongue: None, normal,Extremity Movements Upper (arms, wrists, hands, fingers): None, normal Lower (legs, knees, ankles, toes): None, normal, Trunk Movements Neck, shoulders, hips: None, normal, Overall Severity Severity of abnormal movements (highest score from questions above): None, normal Incapacitation due to abnormal movements: None, normal Patient's awareness of abnormal movements (rate only patient's report): No Awareness, Dental Status Current problems with teeth and/or dentures?: No Does patient usually wear dentures?: No  CIWA:  CIWA-Ar Total: 1 COWS:  COWS Total Score: 3  Musculoskeletal: Strength &  Muscle Tone: within normal limits Gait & Station: normal Patient leans: N/A  Psychiatric Specialty Exam: Physical Exam  Nursing note and vitals reviewed. Constitutional: He is oriented to person, place, and time. He appears well-developed and well-nourished.  HENT:  Head: Normocephalic and atraumatic.  Respiratory: Effort normal.  Neurological: He is alert and oriented to person, place, and time.    ROS  Blood pressure (!) 119/58, pulse 62, temperature 98.4 F (36.9 C), temperature source Oral, resp. rate 16, height 6\' 4"  (1.93 m), weight 108 kg.Body mass index is 28.97 kg/m.  General Appearance: Casual  Eye Contact:  Fair  Speech:  Normal Rate  Volume:  Normal  Mood:  Irritable  Affect:  Congruent  Thought Process:  Coherent and Descriptions of Associations: Circumstantial  Orientation:  Full (Time, Place, and Person)  Thought Content:  Logical  Suicidal Thoughts:  No  Homicidal Thoughts:  No  Memory:  Immediate;   Fair Recent;   Fair Remote;   Fair  Judgement:  Intact  Insight:  Lacking  Psychomotor Activity:  Normal  Concentration:  Concentration: Fair and Attention Span: Fair  Recall:  [employer]Fair  Fund of Knowledge:  Fair  Language:  Good  Akathisia:  Negative  Handed:  Right  AIMS (if indicated):     Assets:  Communication Skills Desire for Improvement Physical Health Resilience  ADL's:  Intact  Cognition:  WNL  Sleep:  Number of Hours: 5.75     Treatment Plan Summary: Daily contact with patient to assess and evaluate symptoms and progress in treatment, Medication management and Plan : Patient is seen and examined.  Patient is a 28 year old male with the above-stated past psychiatric history who was admitted secondary to  substance issues as well as suicidal ideation.  From a psychiatric perspective the patient remained stable at this point.  No major evidence of withdrawal syndromes currently. His only issue continues to be placement to a residential substance abuse  treatment program.  He continues to complain of pain, and from a noncontrolled substance standpoint we are doing all we can.  His sleep is still a problem, and he continues on mirtazapine as well as trazodone.  I will increase his trazodone to 100 mg p.o. nightly.  No change in his Wellbutrin at this point.  His chest x-ray is negative, and his cough is a bit better today.  We will continue the Bactrim for now.  We will plan on discharge in 2 to 3 days.  Hopefully we will know more about residential treatment.  I am going to stop his Catapres today.  I will also increase his gabapentin to 300 mg p.o. 3 times daily for irritability as well as pain control. 1.  Continue Wellbutrin XL 300 mg p.o. daily for mood and anxiety. 2.  Discontinue clonidine detox protocol. 3.  Discontinue Bentyl for GI spasm or abdominal pain. 4.  Continue ibuprofen 800 mg p.o. every 8 hours as needed pain or headache. 5.  Continue mirtazapine 15 mg p.o. nightly for anxiety, mood and sleep. 6.  Continue Robaxin 500 mg p.o. every 8 hours as needed pain. 7.  Discontinue Zofran. 8.  Increase trazodone to 100 mg p.o. nightly as needed insomnia. 9.  Continue Bactrim DS 1 tablet p.o. twice daily for acute bronchitis. 10.  Protonix 40 mg p.o. daily for gastric protection from nonsteroidal anti-inflammatory medications. 11.  Continue occupational therapy/physical therapy for shoulder issues and pain. 12.  Awaiting residential substance abuse treatment program recommendations. 13.  Increase gabapentin to 300 mg p.o. 3 times daily for pain and anxiety. 14.  Disposition planning-we will plan on discharge on 01/29/2018.  Antonieta Pert, MD 01/26/2018, 9:52 AM

## 2018-01-26 NOTE — [facility] Group Notes (Signed)
LCSW Group Therapy Note   01/26/2018 1:15pm   Type of Therapy and Topic:  Group Therapy:  Overcoming Obstacles   Participation Level:  Active   Description of Group:    In this group patients will be encouraged to explore what they see as obstacles to their own wellness and recovery. They will be guided to discuss their thoughts, feelings, and behaviors related to these obstacles. The group will process together ways to cope with barriers, with attention given to specific choices patients can make. Each patient will be challenged to identify changes they are motivated to make in order to overcome their obstacles. This group will be process-oriented, with patients participating in exploration of their own experiences as well as giving and receiving support and challenge from other group members.   Therapeutic Goals: 1. Patient will identify personal and current obstacles as they relate to admission. 2. Patient will identify barriers that currently interfere with their wellness or overcoming obstacles.  3. Patient will identify feelings, thought process and behaviors related to these barriers. 4. Patient will identify two changes they are willing to make to overcome these obstacles:      Summary of Patient Progress  Pt was attentive and engaged during today's processing group. He shared that his biggest obstacle involves "not having any supportive people in my life or any income." Logan Hancock shared that he is hoping to get directly into [facility] at discharge. "I don't want to rob people anymore." Logan Hancock continues to show progress in the group setting with improving insight.     Therapeutic Modalities:   Cognitive Behavioral Therapy Solution Focused Therapy Motivational Interviewing Relapse Prevention Therapy  Rona Ravens, LCSW 01/26/2018 2:32 PM

## 2018-01-26 NOTE — Progress Notes (Signed)
DAR. Pt has been calm and cooperative the whole evening. Stayed in the dayroom until time to go to sleep. Pt attended the eviningg group, took all his meds with no problems. Pt's safety ensured with 15 minute and environmental checks. Pt currently denies SI/HI and A/V hallucinations. Pt verbally agrees to seek staff if SI/HI or A/VH occurs and to consult with staff before acting on any harmful thoughts. Will continue POC.

## 2018-01-26 NOTE — Progress Notes (Signed)
OT Cancellation Note  Patient Details Name: Logan Hancock MRN: [id number] DOB: 03/31/1990   Cancelled Treatment:    Reason Eval/Treat Not Completed: Other (comment) Attempted to reach pt previous surgeon office, they report staff being out for weekend/holiday on Monday. Pt able to teach back exercises for shoulder learned in hospital to OT this date. Advised pt to continue conservatively from hospital until able to follow up with outpatient provider. Will continue to follow in the event OT hears from surgeon.   Dalphine HandingKaylee Takai Chiaramonte, MSOT, OTR/L Behavioral Health OT/ Acute Relief OT [facility] Office: [phone]  Dalphine HandingKaylee Ananya Mccleese 01/26/2018, 5:14 PM

## 2018-01-27 DIAGNOSIS — F1120 Opioid dependence, uncomplicated: Secondary | ICD-10-CM

## 2018-01-27 DIAGNOSIS — F419 Anxiety disorder, unspecified: Secondary | ICD-10-CM

## 2018-01-27 MED ORDER — KETOROLAC TROMETHAMINE 15 MG/ML IJ SOLN
15.0000 mg | Freq: Once | INTRAMUSCULAR | Status: AC
  Administered 2018-01-27: 15 mg via INTRAMUSCULAR
  Filled 2018-01-27 (×2): qty 1

## 2018-01-27 MED ORDER — QUETIAPINE FUMARATE 100 MG PO TABS
ORAL_TABLET | ORAL | Status: AC
  Filled 2018-01-27: qty 1

## 2018-01-27 MED ORDER — ZIPRASIDONE MESYLATE 20 MG IM SOLR
INTRAMUSCULAR | Status: AC
  Filled 2018-01-27: qty 20

## 2018-01-27 MED ORDER — ZIPRASIDONE MESYLATE 20 MG IM SOLR
20.0000 mg | Freq: Once | INTRAMUSCULAR | Status: AC
  Administered 2018-01-27: 20 mg via INTRAMUSCULAR
  Filled 2018-01-27: qty 20

## 2018-01-27 MED ORDER — MIRTAZAPINE 30 MG PO TABS
30.0000 mg | ORAL_TABLET | Freq: Every day | ORAL | Status: DC
  Administered 2018-01-28: 30 mg via ORAL
  Filled 2018-01-27 (×3): qty 1
  Filled 2018-01-27: qty 7

## 2018-01-27 MED ORDER — QUETIAPINE FUMARATE 100 MG PO TABS
100.0000 mg | ORAL_TABLET | ORAL | Status: AC
  Administered 2018-01-27: 100 mg via ORAL
  Filled 2018-01-27 (×2): qty 1

## 2018-01-27 NOTE — Progress Notes (Signed)
[facility]  01/27/2018 11:06 AM Logan Hancock  MRN:  [id number]  Evaluation: Patient seen resting in day room interacting with peers.  During this assessment Logan Hancock presents irritable and pressured.  Patient continues to ruminate with discharge disposition as he indicates he does not have anywhere to go and nobody seems to care.  States he is unsure what medications he is taking.  Does not appear to be invested in treatment.  Reports right-sided shoulder pain 10 out of 10 with 10 being the worst. Reports he has been off all medications since his discharge from jail and unsure what he was prescribed in the past.   Logan Hancock reports ibuprofen is not helping with pain.  Chart review [facility] screening noted for Thursday 7:45 AM patient is requesting to stay until Wednesday.  Report restlessness and anxiety which is worse at night. related to pain and discharge plan. increased Remeron 15 mg to 30 mg  for insomnia.  Ketorolac ordered 15 mg IM patient was agreeable to plan.  Support encouragement reassurance was provided.  Evaluation: Per assessment note: Logan Hancock is a 28 year old male with history of depression, anxiety, heroin abuse, and asthma, presenting for treatment of suicidal ideation with plan to overdose on heroin. He is resting in bed this morning, irritable with assessment process and providing minimal answers. He has long history of heroin use since the age of 28. States he has been using 1/4 gram of heroin daily since he was released from prison last month. Denies ETOH or other drug use. UDS positive for opioids only. His mother died in June and fiance died last month. He reports having thoughts of overdosing because he missed his mother, and his friend suggested he come to the hospital to detox and restart psychotropic medication. He was previously taking Wellbutrin but has been off this medication for weeks. Reports depressed mood for last several weeks with insomnia, fatigue, diminished  appetite, anxiety, and suicidal ideation. Denies SI at this time. Denies HI, AVH, withdrawal symptoms     Principal Problem: Heroin use disorder, severe (HCC) Diagnosis: Principal Problem:   Heroin use disorder, severe (HCC) Active Problems:   Substance induced mood disorder (HCC)   Severe recurrent major depression without psychotic features (HCC)  Total Time spent with patient: 15 minutes  Past Psychiatric History: See admission H&P  Past Medical History:  Past Medical History:  Diagnosis Date  . Asthma   . Bipolar 1 disorder (HCC)   . Drug addiction (HCC)   . MRSA (methicillin resistant staph aureus) culture positive     Past Surgical History:  Procedure Laterality Date  . SHOULDER ARTHROSCOPY Right 12/28/2017   Procedure: ARTHROSCOPY SHOULDER  incision and drainage right shoulder and open incision and drainage shoulder joint;  Surgeon: Beverely LowNorris, Steve, MD;  Location: [facility];  Service: Orthopedics;  Laterality: Right;  . TEE WITHOUT CARDIOVERSION N/A 01/09/2018   Procedure: TRANSESOPHAGEAL ECHOCARDIOGRAM (TEE);  Surgeon: Jake BatheSkains, Mark C, MD;  [facility];  Service: Cardiovascular;  Laterality: N/A;  with anesthesia   Family History: History reviewed. No pertinent family history. Family Psychiatric  History: See admission H&P Social History:  Social History   Substance and Sexual Activity  Alcohol Use No  . Frequency: Never     Social History   Substance and Sexual Activity  Drug Use Yes  . Frequency: 3.0 times per week  . Types: Marijuana, Cocaine, IV   Comment: last crack use 01/18/17, pt states he has been using heroin for  pain.    Social History   Socioeconomic History  . Marital status: Single    Spouse name: Not on file  . Number of children: Not on file  . Years of education: Not on file  . Highest education level: Not on file  Occupational History  . Not on file  Social Needs  . Financial resource strain: Not on file  . Food insecurity:     Worry: Not on file    Inability: Not on file  . Transportation needs:    Medical: Not on file    Non-medical: Not on file  Tobacco Use  . Smoking status: Current Every Day Smoker    Packs/day: 1.00    Years: 2.00    Pack years: 2.00    Types: Cigarettes  . Smokeless tobacco: Never Used  Substance and Sexual Activity  . Alcohol use: No    Frequency: Never  . Drug use: Yes    Frequency: 3.0 times per week    Types: Marijuana, Cocaine, IV    Comment: last crack use 01/18/17, pt states he has been using heroin for pain.  Marland Kitchen Sexual activity: Yes    Birth control/protection: None  Lifestyle  . Physical activity:    Days per week: Not on file    Minutes per session: Not on file  . Stress: Not on file  Relationships  . Social connections:    Talks on phone: Not on file    Gets together: Not on file    Attends religious service: Not on file    Active member of club or organization: Not on file    Attends meetings of clubs or organizations: Not on file    Relationship status: Not on file  Other Topics Concern  . Not on file  Social History Narrative  . Not on file   Additional Social History:                         Sleep: Fair  Appetite:  Fair  Current Medications: Current Facility-Administered Medications  Medication Dose Route Frequency Provider Last Rate Last Dose  . acetaminophen (TYLENOL) tablet 650 mg  650 mg Oral Q6H PRN Nira Conn A, NP      . albuterol (PROVENTIL HFA;VENTOLIN HFA) 108 (90 Base) MCG/ACT inhaler 1-2 puff  1-2 puff Inhalation Q6H PRN Nira Conn A, NP   2 puff at 01/26/18 1218  . alum & mag hydroxide-simeth (MAALOX/MYLANTA) 200-200-20 MG/5ML suspension 30 mL  30 mL Oral Q4H PRN Nira Conn A, NP      . benzonatate (TESSALON) capsule 200 mg  200 mg Oral TID PRN Antonieta Pert, MD      . buPROPion (WELLBUTRIN XL) 24 hr tablet 300 mg  300 mg Oral Daily Nira Conn A, NP   300 mg at 01/27/18 0745  . gabapentin (NEURONTIN) capsule 300 mg   300 mg Oral TID Antonieta Pert, MD   300 mg at 01/27/18 0745  . hydrOXYzine (ATARAX/VISTARIL) tablet 25 mg  25 mg Oral Q6H PRN Nira Conn A, NP   25 mg at 01/24/18 2145  . ibuprofen (ADVIL,MOTRIN) tablet 800 mg  800 mg Oral Q8H PRN Antonieta Pert, MD   800 mg at 01/27/18 0750  . ketorolac (TORADOL) 15 MG/ML injection 15 mg  15 mg Intramuscular Once Oneta Rack, NP      . magnesium hydroxide (MILK OF MAGNESIA) suspension 30 mL  30 mL Oral Daily  PRN Nira ConnBerry, Jason A, NP      . methocarbamol (ROBAXIN) tablet 500 mg  500 mg Oral Q8H PRN Antonieta Pertlary, Greg Lawson, MD   500 mg at 01/27/18 0750  . mirtazapine (REMERON) tablet 30 mg  30 mg Oral QHS Oneta RackLewis, Kelcey Korus N, NP      . nicotine (NICODERM CQ - dosed in mg/24 hours) patch 21 mg  21 mg Transdermal Daily Nira ConnBerry, Jason A, NP   21 mg at 01/27/18 0746  . ondansetron (ZOFRAN) tablet 4 mg  4 mg Oral Q8H PRN Nira ConnBerry, Jason A, NP   4 mg at 01/23/18 1557  . pantoprazole (PROTONIX) EC tablet 40 mg  40 mg Oral Daily Antonieta Pertlary, Greg Lawson, MD   40 mg at 01/27/18 0746  . sulfamethoxazole-trimethoprim (BACTRIM DS,SEPTRA DS) 800-160 MG per tablet 1 tablet  1 tablet Oral Q12H Antonieta Pertlary, Greg Lawson, MD   1 tablet at 01/27/18 0746  . traZODone (DESYREL) tablet 100 mg  100 mg Oral QHS PRN Antonieta Pertlary, Greg Lawson, MD   100 mg at 01/26/18 2209    Lab Results: No results found for this or any previous visit (from the past 48 hour(s)).  Blood Alcohol level:  Lab Results  Component Value Date   ETH <10 01/22/2018   ETH <10 12/02/2016    Metabolic Disorder Labs: No results found for: HGBA1C, MPG No results found for: PROLACTIN No results found for: CHOL, TRIG, HDL, CHOLHDL, VLDL, LDLCALC  Physical Findings: AIMS: Facial and Oral Movements Muscles of Facial Expression: None, normal Lips and Perioral Area: None, normal Jaw: None, normal Tongue: None, normal,Extremity Movements Upper (arms, wrists, hands, fingers): None, normal Lower (legs, knees, ankles, toes): None,  normal, Trunk Movements Neck, shoulders, hips: None, normal, Overall Severity Severity of abnormal movements (highest score from questions above): None, normal Incapacitation due to abnormal movements: None, normal Patient's awareness of abnormal movements (rate only patient's report): No Awareness, Dental Status Current problems with teeth and/or dentures?: No Does patient usually wear dentures?: No  CIWA:  CIWA-Ar Total: 1 COWS:  COWS Total Score: 1  Musculoskeletal: Strength & Muscle Tone: within normal limits Gait & Station: normal Patient leans: N/A  Psychiatric Specialty Exam: Physical Exam  Nursing note and vitals reviewed. Constitutional: He is oriented to person, place, and time. He appears well-developed and well-nourished.  HENT:  Head: Normocephalic and atraumatic.  Respiratory: Effort normal.  Neurological: He is alert and oriented to person, place, and time.  Psychiatric: He has a normal mood and affect. His behavior is normal.    Review of Systems  Musculoskeletal: Positive for joint pain.       Arthritis of shoulder/ infection  Psychiatric/Behavioral: Positive for depression. Negative for suicidal ideas. The patient is nervous/anxious.     Blood pressure 129/72, pulse 86, temperature 98.1 F (36.7 C), resp. rate 20, height 6\' 4"  (1.93 m), weight 108 kg.Body mass index is 28.97 kg/m.  General Appearance: Casual  Eye Contact:  Fair  Speech:  Normal Rate  Volume:  Normal  Mood:  Irritable  Affect:  Congruent  Thought Process:  Coherent and Descriptions of Associations: Circumstantial  Orientation:  Full (Time, Place, and Person)  Thought Content:  Logical  Suicidal Thoughts:  No  Homicidal Thoughts:  No  Memory:  Immediate;   Fair Recent;   Fair Remote;   Fair  Judgement:  Intact  Insight:  Lacking  Psychomotor Activity:  Normal  Concentration:  Concentration: Fair and Attention Span: Fair  Recall:  Fair  Fund of Knowledge:  Fair  Language:  Good   Akathisia:  Negative  Handed:  Right  AIMS (if indicated):     Assets:  Communication Skills Desire for Improvement Physical Health Resilience  ADL's:  Intact  Cognition:  WNL  Sleep:  Number of Hours: 6     Treatment Plan Summary: Daily contact with patient to assess and evaluate symptoms and progress in treatment and Medication management   Continue with current treatment plan on  1/18/2020as listed below except were noted   1.  Continue Wellbutrin XL 300 mg p.o. daily for mood and anxiety. 2.  Discontinue clonidine detox protocol. 3.  Discontinue Bentyl for GI spasm or abdominal pain 4.  Continue ibuprofen 800 mg p.o. every 8 hours as needed pain or headache. 5.  Increased mirtazapine 15 mg  To 30 mg p.o. nightly for anxiety, mood and sleep. 6.  Continue Robaxin 500 mg p.o. every 8 hours as needed pain. 7.  Discontinue Zofran. 8. Continue trazodone to 100 mg p.o. nightly as needed insomnia. 9.  Continue Bactrim DS 1 tablet p.o. twice daily for acute bronchitis. 10.  Protonix 40 mg p.o. daily for gastric protection from nonsteroidal anti-inflammatory medications. 11.  Continue occupational therapy/physical therapy for shoulder issues and pain. 12.  Awaiting residential substance abuse treatment program recommendations. 13.  Continue gabapentin to 300 mg p.o. 3 times daily for pain and anxiety. 14.  Disposition planning-we will plan on discharge on 01/29/2018.  Oneta Rack, NP 01/27/2018, 11:06 AM

## 2018-01-27 NOTE — Plan of Care (Addendum)
  Problem: Coping: Goal: Coping ability will improve Outcome: Progressing   Problem: Education: Goal: Utilization of techniques to improve thought processes will improve Outcome: Progressing   D: Pt alert and oriented on the unit. Pt engaging with RN staff and other pts. Pt denies SI/HI, A/VH. participated during unit groups and activities and is pleasant and cooperative. Pt's goal for the day is "to go to [facility] without incident and try to make it." Pt went to the gym today for recreation and became upset with another pt because the pt was, "getting in my space, and I don't like that sh-t!" Pt was offered medication but refused. "I'm calm, I don't need anything." RN staff processed with pt in the dayroom. Pt is sitting on his bed reading a book. Pt contracts for safety to come talk with RN staff if he is still upset or becomes upset. A: Education, support and encouragement provided, q15 minute safety checks remain in effect. Medications administered per MD orders. R: No reactions/side effects to medicine noted. Pt denies any concerns at this time, and verbally contracts for safety. Pt ambulating on the unit with no issues. Pt remains safe on and off the unit.

## 2018-01-27 NOTE — [facility] Group Notes (Addendum)
Adult Psychoeducational Group Note   Date:  01/27/2018 Time:  1:24 PM  Group Topic/Focus:  Identifying Needs:   The focus of this group is to help patients identify their personal needs that have been historically problematic and identify healthy behaviors to address their needs.  Participation Level:  Active  Participation Quality:  Appropriate  Affect:  Appropriate  Cognitive:  Appropriate  Insight: Appropriate  Engagement in Group:  Engaged  Modes of Intervention:  Activity, Discussion, Education, Exploration, Rapport Building, Socialization and Support  Additional Comments:  Pt attended and participated during goals group.  Colletta Spillers C 01/27/2018, 1:24 PM  
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
## 2018-01-27 NOTE — Progress Notes (Signed)
Patient did not attend the evening speaker AA meeting. Pt exited group shortly after it began, returned to room and fell asleep.

## 2018-01-28 MED ORDER — QUETIAPINE FUMARATE 200 MG PO TABS
200.0000 mg | ORAL_TABLET | Freq: Every day | ORAL | Status: DC
  Administered 2018-01-28: 200 mg via ORAL
  Filled 2018-01-28: qty 7
  Filled 2018-01-28 (×2): qty 1

## 2018-01-28 MED ORDER — KETOROLAC TROMETHAMINE 15 MG/ML IJ SOLN
15.0000 mg | Freq: Once | INTRAMUSCULAR | Status: DC
  Filled 2018-01-28: qty 1

## 2018-01-28 MED ORDER — GABAPENTIN 300 MG PO CAPS
300.0000 mg | ORAL_CAPSULE | Freq: Once | ORAL | Status: AC
  Administered 2018-01-28: 300 mg via ORAL
  Filled 2018-01-28 (×2): qty 1

## 2018-01-28 MED ORDER — GABAPENTIN 400 MG PO CAPS
400.0000 mg | ORAL_CAPSULE | Freq: Three times a day (TID) | ORAL | Status: DC
  Administered 2018-01-28 – 2018-01-29 (×3): 400 mg via ORAL
  Filled 2018-01-28: qty 1
  Filled 2018-01-28 (×2): qty 21
  Filled 2018-01-28 (×3): qty 1
  Filled 2018-01-28: qty 21
  Filled 2018-01-28: qty 1

## 2018-01-28 MED ORDER — QUETIAPINE FUMARATE 100 MG PO TABS
100.0000 mg | ORAL_TABLET | Freq: Once | ORAL | Status: AC
  Administered 2018-01-28: 100 mg via ORAL
  Filled 2018-01-28 (×2): qty 1

## 2018-01-28 NOTE — Progress Notes (Signed)
[facility] MD Progress Note  01/28/2018 11:27 AM Logan Hancock  MRN:  [id number]  Evaluation: [name] observed attending daily group sessions with active and engaged participation.  Continues to deny suicidal or homicidal ideations.  Patient continues to ruminate with discharging before his assessment with [facility] on thursday.  Overall.  [name] reports his mood has improved since being restarted on his medication.  States he is hopeful to get into long-term treatment facility.  [name]  reports " I dont have anywhere to go, and I need a little bit of time". Patient reports he is hopeful  hopeful to stay until then.  Reports verbal altercation with peers and states he got upset due to thoughts of being in jail and benign disrespected. Agitation protocol was initiated after reported multiple attempts at de-escalation.  Patient reports " flashbacks from jail, and I got angry and upset."  Patient appeared to be very apologetic during this assessment.    Rates his depression 7 out of 10 with 10 being the worst.  Reports taking and tolerating medications well.  Reports a good appetite.  States he is resting well throughout the night.  Reports is been a follow-up with social work regarding housing.  Support encouragement reassurance was provided.  Evaluation: Per assessment note: Logan Hancock is a 28 year old male with history of depression, anxiety, heroin abuse, and asthma, presenting for treatment of suicidal ideation with plan to overdose on heroin. He is resting in bed this morning, irritable with assessment process and providing minimal answers. He has long history of heroin use since the age of 32. States he has been using 1/4 gram of heroin daily since he was released from prison last month. Denies ETOH or other drug use. UDS positive for opioids only. His mother died in 2022/07/04 and fiance died last month. He reports having thoughts of overdosing because he missed his mother, and his friend suggested he come to the  hospital to detox and restart psychotropic medication. He was previously taking Wellbutrin but has been off this medication for weeks. Reports depressed mood for last several weeks with insomnia, fatigue, diminished appetite, anxiety, and suicidal ideation. Denies SI at this time. Denies HI, AVH, withdrawal symptoms     Principal Problem: Heroin use disorder, severe (HCC) Diagnosis: Principal Problem:   Heroin use disorder, severe (HCC) Active Problems:   Substance induced mood disorder (HCC)   Severe recurrent major depression without psychotic features (HCC)  Total Time spent with patient: 15 minutes  Past Psychiatric History: See admission H&P  Past Medical History:  Past Medical History:  Diagnosis Date  . Asthma   . Bipolar 1 disorder (HCC)   . Drug addiction (HCC)   . MRSA (methicillin resistant staph aureus) culture positive     Past Surgical History:  Procedure Laterality Date  . SHOULDER ARTHROSCOPY Right 12/28/2017   Procedure: ARTHROSCOPY SHOULDER  incision and drainage right shoulder and open incision and drainage shoulder joint;  Surgeon: Beverely Low, MD;  Location: [facility]S;  Service: Orthopedics;  Laterality: Right;  . TEE WITHOUT CARDIOVERSION N/A 01/09/2018   Procedure: TRANSESOPHAGEAL ECHOCARDIOGRAM (TEE);  Surgeon: Jake Bathe, MD;  Location: [facility] ENDOSCOPY;  Service: Cardiovascular;  Laterality: N/A;  with anesthesia   Family History: History reviewed. No pertinent family history. Family Psychiatric  History: See admission H&P Social History:  Social History   Substance and Sexual Activity  Alcohol Use No  . Frequency: Never     Social History   Substance and Sexual Activity  Drug Use Yes  . Frequency: 3.0 times per week  . Types: Marijuana, Cocaine, IV   Comment: last crack use 01/18/17, pt states he has been using heroin for pain.    Social History   Socioeconomic History  . Marital status: Single    Spouse name: Not on file  . Number of  children: Not on file  . Years of education: Not on file  . Highest education level: Not on file  Occupational History  . Not on file  Social Needs  . Financial resource strain: Not on file  . Food insecurity:    Worry: Not on file    Inability: Not on file  . Transportation needs:    Medical: Not on file    Non-medical: Not on file  Tobacco Use  . Smoking status: Current Every Day Smoker    Packs/day: 1.00    Years: 2.00    Pack years: 2.00    Types: Cigarettes  . Smokeless tobacco: Never Used  Substance and Sexual Activity  . Alcohol use: No    Frequency: Never  . Drug use: Yes    Frequency: 3.0 times per week    Types: Marijuana, Cocaine, IV    Comment: last crack use 01/18/17, pt states he has been using heroin for pain.  Marland Kitchen Sexual activity: Yes    Birth control/protection: None  Lifestyle  . Physical activity:    Days per week: Not on file    Minutes per session: Not on file  . Stress: Not on file  Relationships  . Social connections:    Talks on phone: Not on file    Gets together: Not on file    Attends religious service: Not on file    Active member of club or organization: Not on file    Attends meetings of clubs or organizations: Not on file    Relationship status: Not on file  Other Topics Concern  . Not on file  Social History Narrative  . Not on file   Additional Social History:                         Sleep: Fair  Appetite:  Fair  Current Medications: Current Facility-Administered Medications  Medication Dose Route Frequency Provider Last Rate Last Dose  . acetaminophen (TYLENOL) tablet 650 mg  650 mg Oral Q6H PRN Nira Conn A, NP   650 mg at 01/28/18 0938  . albuterol (PROVENTIL HFA;VENTOLIN HFA) 108 (90 Base) MCG/ACT inhaler 1-2 puff  1-2 puff Inhalation Q6H PRN Nira Conn A, NP   2 puff at 01/26/18 1218  . alum & mag hydroxide-simeth (MAALOX/MYLANTA) 200-200-20 MG/5ML suspension 30 mL  30 mL Oral Q4H PRN Nira Conn A, NP      .  benzonatate (TESSALON) capsule 200 mg  200 mg Oral TID PRN Antonieta Pert, MD      . buPROPion (WELLBUTRIN XL) 24 hr tablet 300 mg  300 mg Oral Daily Nira Conn A, NP   300 mg at 01/28/18 0934  . gabapentin (NEURONTIN) capsule 300 mg  300 mg Oral TID Antonieta Pert, MD   300 mg at 01/28/18 0932  . hydrOXYzine (ATARAX/VISTARIL) tablet 25 mg  25 mg Oral Q6H PRN Nira Conn A, NP   25 mg at 01/24/18 2145  . ibuprofen (ADVIL,MOTRIN) tablet 800 mg  800 mg Oral Q8H PRN Antonieta Pert, MD   800 mg at 01/27/18 0750  . magnesium  hydroxide (MILK OF MAGNESIA) suspension 30 mL  30 mL Oral Daily PRN Nira ConnBerry, Jason A, NP      . methocarbamol (ROBAXIN) tablet 500 mg  500 mg Oral Q8H PRN Antonieta Pertlary, Greg Lawson, MD   500 mg at 01/27/18 0750  . mirtazapine (REMERON) tablet 30 mg  30 mg Oral QHS Oneta RackLewis, Adalai Perl N, NP   Stopped at 01/28/18 0200  . nicotine (NICODERM CQ - dosed in mg/24 hours) patch 21 mg  21 mg Transdermal Daily Nira ConnBerry, Jason A, NP   21 mg at 01/28/18 0937  . ondansetron (ZOFRAN) tablet 4 mg  4 mg Oral Q8H PRN Nira ConnBerry, Jason A, NP   4 mg at 01/23/18 1557  . pantoprazole (PROTONIX) EC tablet 40 mg  40 mg Oral Daily Antonieta Pertlary, Greg Lawson, MD   40 mg at 01/28/18 0932  . sulfamethoxazole-trimethoprim (BACTRIM DS,SEPTRA DS) 800-160 MG per tablet 1 tablet  1 tablet Oral Q12H Antonieta Pertlary, Greg Lawson, MD   1 tablet at 01/28/18 0932  . traZODone (DESYREL) tablet 100 mg  100 mg Oral QHS PRN Antonieta Pertlary, Greg Lawson, MD   100 mg at 01/26/18 2209    Lab Results: No results found for this or any previous visit (from the past 48 hour(s)).  Blood Alcohol level:  Lab Results  Component Value Date   ETH <10 01/22/2018   ETH <10 12/02/2016    Metabolic Disorder Labs: No results found for: HGBA1C, MPG No results found for: PROLACTIN No results found for: CHOL, TRIG, HDL, CHOLHDL, VLDL, LDLCALC  Physical Findings: AIMS: Facial and Oral Movements Muscles of Facial Expression: None, normal Lips and Perioral Area: None,  normal Jaw: None, normal Tongue: None, normal,Extremity Movements Upper (arms, wrists, hands, fingers): None, normal Lower (legs, knees, ankles, toes): None, normal, Trunk Movements Neck, shoulders, hips: None, normal, Overall Severity Severity of abnormal movements (highest score from questions above): None, normal Incapacitation due to abnormal movements: None, normal Patient's awareness of abnormal movements (rate only patient's report): No Awareness, Dental Status Current problems with teeth and/or dentures?: No Does patient usually wear dentures?: No  CIWA:  CIWA-Ar Total: 1 COWS:  COWS Total Score: 0  Musculoskeletal: Strength & Muscle Tone: within normal limits Gait & Station: normal Patient leans: N/A  Psychiatric Specialty Exam: Physical Exam  Nursing note and vitals reviewed. Constitutional: He is oriented to person, place, and time. He appears well-developed and well-nourished.  HENT:  Head: Normocephalic and atraumatic.  Respiratory: Effort normal.  Neurological: He is alert and oriented to person, place, and time.  Psychiatric: He has a normal mood and affect. His behavior is normal.    Review of Systems  Musculoskeletal: Positive for joint pain.       Arthritis of shoulder/ infection  Psychiatric/Behavioral: Positive for depression. Negative for suicidal ideas. The patient is nervous/anxious.     Blood pressure 121/77, pulse (!) 105, temperature (!) 97.5 F (36.4 C), temperature source Oral, resp. rate 18, height 6\' 4"  (1.93 m), weight 108 kg.Body mass index is 28.97 kg/m.  General Appearance: Casual  Eye Contact:  Fair  Speech:  Normal Rate  Volume:  Normal  Mood:  Irritable  Affect:  Congruent  Thought Process:  Coherent and Descriptions of Associations: Circumstantial  Orientation:  Full (Time, Place, and Person)  Thought Content:  Logical  Suicidal Thoughts:  No  Homicidal Thoughts:  No  Memory:  Immediate;   Fair Recent;   Fair Remote;   Fair   Judgement:  Intact  Insight:  Lacking  Psychomotor Activity:  Normal  Concentration:  Concentration: Fair and Attention Span: Fair  Recall:  [employer] of Knowledge:  Fair  Language:  Good  Akathisia:  Negative  Handed:  Right  AIMS (if indicated):     Assets:  Communication Skills Desire for Improvement Physical Health Resilience  ADL's:  Intact  Cognition:  WNL  Sleep:  Number of Hours: 5.75     Treatment Plan Summary: Daily contact with patient to assess and evaluate symptoms and progress in treatment and Medication management   Continue with current treatment plan on  1/19/2020as listed below except were noted   1.  Continue Wellbutrin XL 300 mg p.o. daily for mood and anxiety. 2.  Discontinue clonidine detox protocol. 3.  Discontinue Bentyl for GI spasm or abdominal pain 4.  Continue ibuprofen 800 mg p.o. every 8 hours as needed pain or headache. 5.  Continue mirtazapine 30 mg p.o. nightly for anxiety, mood and sleep. 6.  Continue Robaxin 500 mg p.o. every 8 hours as needed pain. 7.  Discontinue Zofran. 8. Continue trazodone to 100 mg p.o. nightly as needed insomnia. 9.  Continue Bactrim DS 1 tablet p.o. twice daily for acute bronchitis. 10.  Protonix 40 mg p.o. daily for gastric protection from nonsteroidal anti-inflammatory medications. 11.  Continue occupational therapy/physical therapy for shoulder issues and pain. 12.  Awaiting residential substance abuse treatment program recommendations. 13.  Continue gabapentin to 300 mg p.o. 3 times daily for pain and anxiety. 14.  Disposition planning-we will plan on discharge on 01/29/2018.  Oneta Rack, NP 01/28/2018, 11:27 AM

## 2018-01-28 NOTE — Progress Notes (Signed)
Patient has slept since admin of geodon IM due to his level of agitation. Attempted to awake patient for his scheduled antibiotic and to briefly assess patient however he opened his eyes and waved this Clinical research associate away. RR WNL, even and unlabored. NAD, no complaints. Remains safe on level III obs.

## 2018-01-28 NOTE — Plan of Care (Signed)
  Problem: Activity: Goal: Interest or engagement in leisure activities will improve Outcome: Progressing   Problem: Coping: Goal: Coping ability will improve Outcome: Progressing   D: Pt alert and oriented on the unit. Pt engaging with RN staff and other pts. Pt denies SI/HI, A/VH. Pt was talkative on the unit played board games in the day room with other pts. Pt also participated during unit groups and activities.  A: Education, support and encouragement provided, q15 minute safety checks remain in effect. Medications administered per MD orders. R: No reactions/side effects to medicine noted. Pt denies any concerns at this time, and verbally contracts for safety. Pt ambulating on the unit with no issues. Pt remains safe on and off the unit.

## 2018-01-28 NOTE — [facility] Group Notes (Signed)
[facility] LCSW Group Therapy Note  Date/Time:    01/28/2018 10:00-11:00AM  Type of Therapy and Topic:  Group Therapy:  Practicing Self-Kindness  Participation Level:  Active   Description of Group:  The focus of this group is to examine human tendencies to be hyper critical of self and how this leads to feelings of worthlessness, hopelessness, and shame.  Patients were guided to the concept that shame is universal and worsened by being kept hidden but improved by being revealed.  We discussed how not feeling worthy is the result of shame and discussed the differences between guilt and shame.  Part of a song "Worth It" was played to encourage people to think differently about worth.  It was shared why it is important to build the ability to tolerate discomfort, since numbing emotions unfortunately applies to both painful and positive feelings and is not selective.  Gratitude was linked to joy and a variety of coping methods were suggested.  Multiple exercises were led to experience a shortened version of several of the skills described.  A song was played at the end of group entitled "I am enough."  Therapeutic Goals 1. Identify statements patients automatically say to themselves, "I'll be worthy when...." and examine how this is not kind to self because it indicates we cannot be worthy until some far-reaching goal is achieved. 2. Share current healthy and unhealthy coping skills used. 3. Practice multiple coping skills including a. Questioning whether they feel guilty ("I did something bad") or shame ("I am bad") and whether this feeling is based in fact. b. 5 senses mindfulness c. Grounding  d. AEIOUY 4. Learn about research linking gratitude to joy, and the difference between happiness and joy. 5. Encourage to do the needed work to Nurse, mental health, focusing on how medication is part of the solution to emotional and mental problems, while coping skills are necessary to actually change  behavior.  Summary of Patient Progress: During group, patient expressed that a current healthy coping skill used is work while a current unhealthy coping skill used is drugs.  Patient had some resistance to some of the exercises, but did try each of them.   Therapeutic Modalities Activity Processing Lecture   Ambrose Mantle, LCSW 01/28/2018, 1:54 PM

## 2018-01-29 MED ORDER — SULFAMETHOXAZOLE-TRIMETHOPRIM 800-160 MG PO TABS: 1.0000 | ORAL_TABLET | Freq: Two times a day (BID) | ORAL | Status: DC

## 2018-01-29 MED ORDER — IBUPROFEN 400 MG PO TABS: 400.0000 mg | ORAL_TABLET | Freq: Four times a day (QID) | ORAL | 0 refills | Status: AC | PRN

## 2018-01-29 MED ORDER — QUETIAPINE FUMARATE 200 MG PO TABS: 200.0000 mg | ORAL_TABLET | Freq: Every day | ORAL | 0 refills | Status: AC

## 2018-01-29 MED ORDER — MIRTAZAPINE 30 MG PO TABS: 30.0000 mg | ORAL_TABLET | Freq: Every day | ORAL | 0 refills | Status: AC

## 2018-01-29 MED ORDER — IBUPROFEN 400 MG PO TABS
400.0000 mg | ORAL_TABLET | Freq: Four times a day (QID) | ORAL | Status: DC | PRN
  Filled 2018-01-29: qty 30

## 2018-01-29 MED ORDER — NICOTINE 21 MG/24HR TD PT24: 21.0000 mg | MEDICATED_PATCH | Freq: Every day | TRANSDERMAL | 0 refills | Status: DC

## 2018-01-29 MED ORDER — ALBUTEROL SULFATE HFA 108 (90 BASE) MCG/ACT IN AERS: 1.0000 | INHALATION_SPRAY | Freq: Four times a day (QID) | RESPIRATORY_TRACT | 0 refills | Status: DC | PRN

## 2018-01-29 MED ORDER — TRAZODONE HCL 100 MG PO TABS: 100.0000 mg | ORAL_TABLET | Freq: Every evening | ORAL | 0 refills | Status: AC | PRN

## 2018-01-29 MED ORDER — GABAPENTIN 400 MG PO CAPS: 400.0000 mg | ORAL_CAPSULE | Freq: Three times a day (TID) | ORAL | 0 refills | Status: DC

## 2018-01-29 MED ORDER — BUPROPION HCL ER (XL) 300 MG PO TB24: 300.0000 mg | ORAL_TABLET | Freq: Every day | ORAL | 0 refills | Status: AC

## 2018-01-29 MED ORDER — PANTOPRAZOLE SODIUM 40 MG PO TBEC: 40.0000 mg | DELAYED_RELEASE_TABLET | Freq: Every day | ORAL | 0 refills | Status: AC

## 2018-01-29 MED ORDER — HYDROXYZINE HCL 25 MG PO TABS: 25.0000 mg | ORAL_TABLET | Freq: Four times a day (QID) | ORAL | 0 refills | Status: DC | PRN

## 2018-01-29 NOTE — Tx Team (Signed)
Interdisciplinary Treatment and Diagnostic Plan Update  01/29/2018 Time of Session: [id number] Basilia Jumboyler K Ebey MRN: [id number]  Principal Diagnosis: Heroin use disorder, severe (HCC)  Secondary Diagnoses: Principal Problem:   Heroin use disorder, severe (HCC) Active Problems:   Substance induced mood disorder (HCC)   Severe recurrent major depression without psychotic features (HCC)   Current Medications:  Current Facility-Administered Medications  Medication Dose Route Frequency Provider Last Rate Last Dose  . acetaminophen (TYLENOL) tablet 650 mg  650 mg Oral Q6H PRN Nira ConnBerry, Jason A, NP   650 mg at 01/28/18 0938  . albuterol (PROVENTIL HFA;VENTOLIN HFA) 108 (90 Base) MCG/ACT inhaler 1-2 puff  1-2 puff Inhalation Q6H PRN Nira ConnBerry, Jason A, NP   2 puff at 01/26/18 1218  . alum & mag hydroxide-simeth (MAALOX/MYLANTA) 200-200-20 MG/5ML suspension 30 mL  30 mL Oral Q4H PRN Nira ConnBerry, Jason A, NP      . benzonatate (TESSALON) capsule 200 mg  200 mg Oral TID PRN Antonieta Pertlary, Greg Lawson, MD      . buPROPion (WELLBUTRIN XL) 24 hr tablet 300 mg  300 mg Oral Daily Nira ConnBerry, Jason A, NP   300 mg at 01/29/18 [id number]  . gabapentin (NEURONTIN) capsule 400 mg  400 mg Oral TID Antonieta Pertlary, Greg Lawson, MD   400 mg at 01/29/18 [id number]  . hydrOXYzine (ATARAX/VISTARIL) tablet 25 mg  25 mg Oral Q6H PRN Jackelyn PolingBerry, Jason A, NP   25 mg at 01/24/18 2145  . ibuprofen (ADVIL,MOTRIN) tablet 800 mg  800 mg Oral Q8H PRN Antonieta Pertlary, Greg Lawson, MD   800 mg at 01/27/18 0750  . magnesium hydroxide (MILK OF MAGNESIA) suspension 30 mL  30 mL Oral Daily PRN Nira ConnBerry, Jason A, NP      . mirtazapine (REMERON) tablet 30 mg  30 mg Oral QHS Oneta RackLewis, Tanika N, NP   30 mg at 01/28/18 2111  . nicotine (NICODERM CQ - dosed in mg/24 hours) patch 21 mg  21 mg Transdermal Daily Nira ConnBerry, Jason A, NP   21 mg at 01/29/18 0813  . ondansetron (ZOFRAN) tablet 4 mg  4 mg Oral Q8H PRN Nira ConnBerry, Jason A, NP   4 mg at 01/23/18 1557  . pantoprazole (PROTONIX) EC tablet 40 mg  40 mg Oral Daily  Antonieta Pertlary, Greg Lawson, MD   40 mg at 01/29/18 [id number]  . QUEtiapine (SEROQUEL) tablet 200 mg  200 mg Oral QHS Antonieta Pertlary, Greg Lawson, MD   200 mg at 01/28/18 2111  . sulfamethoxazole-trimethoprim (BACTRIM DS,SEPTRA DS) 800-160 MG per tablet 1 tablet  1 tablet Oral Q12H Antonieta Pertlary, Greg Lawson, MD   1 tablet at 01/29/18 [id number]  . traZODone (DESYREL) tablet 100 mg  100 mg Oral QHS PRN Antonieta Pertlary, Greg Lawson, MD   100 mg at 01/26/18 2209   PTA Medications: Medications Prior to Admission  Medication Sig Dispense Refill Last Dose  . albuterol (PROVENTIL HFA;VENTOLIN HFA) 108 (90 Base) MCG/ACT inhaler Inhale 1-2 puffs into the lungs every 6 (six) hours as needed for wheezing or shortness of breath. (Patient not taking: Reported on 01/22/2018) 1 Inhaler 0 Not Taking at Unknown time  . buPROPion (WELLBUTRIN XL) 300 MG 24 hr tablet Take 1 tablet (300 mg total) by mouth daily. For mood control (Patient not taking: Reported on 01/22/2018) 30 tablet 0 Not Taking at Unknown time  . cephALEXin (KEFLEX) 500 MG capsule Take 1 capsule (500 mg total) by mouth 4 (four) times daily. (Patient not taking: Reported on 01/22/2018) 120 capsule 0 Not Taking at Unknown time  .  docusate sodium (COLACE) 100 MG capsule Take 1 capsule (100 mg total) by mouth 2 (two) times daily. (Patient not taking: Reported on 01/22/2018) 10 capsule 0 Not Taking at Unknown time  . hydrOXYzine (ATARAX/VISTARIL) 25 MG tablet Take 1 tablet (25 mg total) by mouth every 6 (six) hours as needed for anxiety. (Patient not taking: Reported on 01/22/2018) 10 tablet 0 Not Taking at Unknown time  . ibuprofen (ADVIL,MOTRIN) 400 MG tablet Take 1 tablet (400 mg total) by mouth every 6 (six) hours as needed for moderate pain. (Patient not taking: Reported on 01/22/2018) 30 tablet 0 Not Taking at Unknown time  . mirtazapine (REMERON) 15 MG tablet Take 1 tablet (15 mg total) by mouth at bedtime. (Patient not taking: Reported on 01/22/2018) 30 tablet 0 Not Taking at Unknown time  . oxyCODONE  (OXY IR/ROXICODONE) 5 MG immediate release tablet Take 1 tablet (5 mg total) by mouth every 4 (four) hours as needed for moderate pain (pain score 4-6). (Patient not taking: Reported on 01/22/2018) 15 tablet 0 Not Taking at Unknown time  . traZODone (DESYREL) 50 MG tablet Take 1 tablet (50 mg total) by mouth at bedtime as needed for sleep. (Patient not taking: Reported on 01/22/2018) 30 tablet 0 Not Taking at Unknown time    Patient Stressors: Financial difficulties Health problems Medication change or noncompliance Substance abuse  Patient Strengths: Ability for insight Average or above average intelligence Capable of independent living Communication skills Motivation for treatment/growth  Treatment Modalities: Medication Management, Group therapy, Case management,  1 to 1 session with clinician, Psychoeducation, Recreational therapy.   Physician Treatment Plan for Primary Diagnosis: Heroin use disorder, severe (HCC) Long Term Goal(s): Improvement in symptoms so as ready for discharge Improvement in symptoms so as ready for discharge   Short Term Goals: Ability to identify changes in lifestyle to reduce recurrence of condition will improve Ability to verbalize feelings will improve Ability to disclose and discuss suicidal ideas Ability to demonstrate self-control will improve Ability to identify and develop effective coping behaviors will improve Ability to identify triggers associated with substance abuse/mental health issues will improve  Medication Management: Evaluate patient's response, side effects, and tolerance of medication regimen.  Therapeutic Interventions: 1 to 1 sessions, Unit Group sessions and Medication administration.  Evaluation of Outcomes: Adequate for discharge  Physician Treatment Plan for Secondary Diagnosis: Principal Problem:   Heroin use disorder, severe (HCC) Active Problems:   Substance induced mood disorder (HCC)   Severe recurrent major depression  without psychotic features (HCC)  Long Term Goal(s): Improvement in symptoms so as ready for discharge Improvement in symptoms so as ready for discharge   Short Term Goals: Ability to identify changes in lifestyle to reduce recurrence of condition will improve Ability to verbalize feelings will improve Ability to disclose and discuss suicidal ideas Ability to demonstrate self-control will improve Ability to identify and develop effective coping behaviors will improve Ability to identify triggers associated with substance abuse/mental health issues will improve     Medication Management: Evaluate patient's response, side effects, and tolerance of medication regimen.  Therapeutic Interventions: 1 to 1 sessions, Unit Group sessions and Medication administration.  Evaluation of Outcomes: Adequate for discharge   RN Treatment Plan for Primary Diagnosis: Heroin use disorder, severe (HCC) Long Term Goal(s): Knowledge of disease and therapeutic regimen to maintain health will improve  Short Term Goals: Ability to verbalize frustration and anger appropriately will improve, Ability to demonstrate self-control, Ability to verbalize feelings will improve and Ability to  identify and develop effective coping behaviors will improve  Medication Management: RN will administer medications as ordered by provider, will assess and evaluate patient's response and provide education to patient for prescribed medication. RN will report any adverse and/or side effects to prescribing provider.  Therapeutic Interventions: 1 on 1 counseling sessions, Psychoeducation, Medication administration, Evaluate responses to treatment, Monitor vital signs and CBGs as ordered, Perform/monitor CIWA, COWS, AIMS and Fall Risk screenings as ordered, Perform wound care treatments as ordered.  Evaluation of Outcomes: Adequate for discharge   LCSW Treatment Plan for Primary Diagnosis: Heroin use disorder, severe (HCC) Long Term  Goal(s): Safe transition to appropriate next level of care at discharge, Engage patient in therapeutic group addressing interpersonal concerns.  Short Term Goals: Engage patient in aftercare planning with referrals and resources, Increase emotional regulation, Facilitate patient progression through stages of change regarding substance use diagnoses and concerns and Increase skills for wellness and recovery  Therapeutic Interventions: Assess for all discharge needs, 1 to 1 time with Social worker, Explore available resources and support systems, Assess for adequacy in community support network, Educate family and significant other(s) on suicide prevention, Complete Psychosocial Assessment, Interpersonal group therapy.  Evaluation of Outcomes: Adequate for discharge   Progress in Treatment: Attending groups: Yes. Participating in groups: Yes. Taking medication as prescribed: Yes. Toleration medication: Yes. Family/Significant other contact made: SPE completed with pt; pt declined to consent to collateral contact.  Patient understands diagnosis: Yes. Discussing patient identified problems/goals with staff: Yes. Medical problems stabilized or resolved: Yes. Denies suicidal/homicidal ideation: Yes. Issues/concerns per patient self-inventory: No. Other: n/a  New problem(s) identified: No, Describe:  n/a  New Short Term/Long Term Goal(s): detox, medication management for mood stabilization; elimination of SI thoughts; development of comprehensive mental wellness/sobriety plan.   Patient Goals:  "to get off heroin and try to get into [employer]. Help with housing and some type of employment."   Discharge Plan or Barriers: Pt discharging today, given shelter resources. Pt has [facility] screening on Wed 1/22;  [facility] appt made. [facility] pamphlet, [facility] Crisis information, and AA/NA information provided to patient for additional community support and resources.   Reason for Continuation of Hospitalization:  none  Estimated Length of Stay: Today, 01/29/2018  Attendees: Patient:  01/29/2018 9:27 AM  Physician: Dr. Jola Babinski MD 01/29/2018 9:27 AM  Nursing: Huntley Dec RN; Marchelle Folks RN 01/29/2018 9:27 AM  RN Care Manager:x 01/29/2018 9:27 AM  Social Worker: Corrie Mckusick LCSW 01/29/2018 9:27 AM  Recreational Therapist: x 01/29/2018 9:27 AM  Other: Marciano Sequin NP 01/29/2018 9:27 AM  Other:  01/29/2018 9:27 AM  Other: 01/29/2018 9:27 AM    Scribe for Treatment Team: Rona Ravens, LCSW 01/29/2018 9:27 AM

## 2018-01-29 NOTE — Progress Notes (Signed)
Recreation Therapy Notes  Date: 1.20.20   Time: 0930 Location: [address] Dayroom  Group Topic: Stress Management  Goal Area(s) Addresses:  Patient will identify stress management techniques. Patient will identify benefit of using stress management post d/c.   Behavioral Response: Engaged  Intervention: Stress Management  Activity : Guided Imagery.  LRT introduced the stress management technique of guided imagery.  LRT read a script that let patients envision laying outside in a meadow at summer time.  Patients were to listen and follow along as script was read to engage in activity.  Education:  Stress Management, Discharge Planning.   Education Outcome: Acknowledges Education  Clinical Observations/Feedback: Pt attended and participated group.    Caroll Rancher, LRT/CTRS         Caroll Rancher A 01/29/2018 12:02 PM

## 2018-01-29 NOTE — Discharge Summary (Signed)
Physician Discharge Summary Note  Patient:  Logan Hancock is an 28 y.o., male  MRN:  [id number]  DOB:  06/27/1990  Patient phone:  [phone] (home)   Patient [place]   [address] [place] [id number],   Total Time spent with patient: Greater than 30 minutes  Date of Admission:  01/23/2018  Date of Discharge: 01/29/18  Reason for Admission: Suicidal ideation with plan to overdose on heroin.  Principal Problem: Heroin use disorder, severe [facility](HCC)  Discharge Diagnoses: Patient Active Problem List   Diagnosis Date Noted  . Septic arthritis of shoulder, right (HCC) [M00.9] 12/28/2017  . Hyponatremia [E87.1] 12/28/2017  . Infection of shoulder (HCC) [M00.9] 12/28/2017  . Sepsis (HCC) [A41.9] 12/27/2017  . Severe recurrent major depression without psychotic features (HCC) [F33.2] 12/03/2016  . Heroin use disorder, severe (HCC) [F11.20] 12/03/2016  . Substance induced mood disorder (HCC) [F19.94] 10/14/2016  . Polysubstance abuse (HCC) [F19.10] 10/14/2016  . Suicidal ideation [R45.851]    Past Psychiatric History: MDD, SUD  Past Medical History:  Past Medical History:  Diagnosis Date  . Asthma   . Bipolar 1 disorder (HCC)   . Drug addiction (HCC)   . MRSA (methicillin resistant staph aureus) culture positive     Past Surgical History:  Procedure Laterality Date  . SHOULDER ARTHROSCOPY Right 12/28/2017   Procedure: ARTHROSCOPY SHOULDER  incision and drainage right shoulder and open incision and drainage shoulder joint;  Surgeon: Beverely LowNorris, Steve, MD;  Location: [facility];  Service: Orthopedics;  Laterality: Right;  . TEE WITHOUT CARDIOVERSION N/A 01/09/2018   Procedure: TRANSESOPHAGEAL ECHOCARDIOGRAM (TEE);  Surgeon: Jake BatheSkains, Mark C, MD;  [facility];  Service: Cardiovascular;  Laterality: N/A;  with anesthesia   Family History: History reviewed. No pertinent family history.  Family Psychiatric  History: See H&P  Social History:  Social History    Substance and Sexual Activity  Alcohol Use No  . Frequency: Never     Social History   Substance and Sexual Activity  Drug Use Yes  . Frequency: 3.0 times per week  . Types: Marijuana, Cocaine, IV   Comment: last crack use 01/18/17, pt states he has been using heroin for pain.    Social History   Socioeconomic History  . Marital status: Single    Spouse name: Not on file  . Number of children: Not on file  . Years of education: Not on file  . Highest education level: Not on file  Occupational History  . Not on file  Social Needs  . Financial resource strain: Not on file  . Food insecurity:    Worry: Not on file    Inability: Not on file  . Transportation needs:    Medical: Not on file    Non-medical: Not on file  Tobacco Use  . Smoking status: Current Every Day Smoker    Packs/day: 1.00    Years: 2.00    Pack years: 2.00    Types: Cigarettes  . Smokeless tobacco: Never Used  Substance and Sexual Activity  . Alcohol use: No    Frequency: Never  . Drug use: Yes    Frequency: 3.0 times per week    Types: Marijuana, Cocaine, IV    Comment: last crack use 01/18/17, pt states he has been using heroin for pain.  Marland Kitchen. Sexual activity: Yes    Birth control/protection: None  Lifestyle  . Physical activity:    Days per week: Not on file    Minutes per session: Not  on file  . Stress: Not on file  Relationships  . Social connections:    Talks on phone: Not on file    Gets together: Not on file    Attends religious service: Not on file    Active member of club or organization: Not on file    Attends meetings of clubs or organizations: Not on file    Relationship status: Not on file  Other Topics Concern  . Not on file  Social History Narrative  . Not on file   Hospital Course: (Per NP's admission evaluation): Mr. Sereno is a 28 year old male with history of depression, anxiety, heroin abuse, and asthma, presenting for treatment of suicidal ideation with plan to overdose  on heroin. He is resting in bed this morning, irritable with assessment process and providing minimal answers. He has long history of heroin use since the age of 42. States he has been using 1/4 gram of heroin daily since he was released from prison last month. Denies ETOH or other drug use. UDS positive for opioids only. His mother died in 02-Jul-2022 and fiance died last month. He reports having thoughts of overdosing because he missed his mother, and his friend suggested he come to the hospital to detox and restart psychotropic medication. He was previously taking Wellbutrin but has been off this medication for weeks. Reports depressed mood for last several weeks with insomnia, fatigue, diminished appetite, anxiety, and suicidal ideation. Denies SI at this time. Denies HI, AVH, withdrawal symptoms.  After the above admission assessment, Fransico was recommended for mood stabilization treatments. Part of his treatment plan was a recommended/referral to a long term substance abuse treatment program for his drug abuse/dependency. He did not receive any detoxification treatments as he was presenting with no substance withdrawal symptoms. He received & was stabilized on the medications as listed below. He tolerated his treatment regimen without any adverse effects or reactions reported. He was also enrolled & participated in the group counseling sessions being offered & held on this unit. He learned coping skills.  Jivan is seen today for discharge. He has normal anxiety about going into a new setting (substance abuse treatment program). He is not overwhelmed by this. He is looking forward to working on his addiction. Not expressing any delusions today. No hallucination. Feels in control of himself. No passivity of thought. No passivity of will. No fantasy about suicide lately. No suicidal thoughts. Looking forward to completing this treatment program. No thoughts of violence. No craving for drugs. Does not feel depressed.  No evidence of mania.   The nursing staff reports that patient has been appropriate on the unit. Patient has been interacting well with peers. No behavioral issues. Patient has not voiced any suicidal thoughts. Patient has not been observed to be internally stimulated or preoccupied. Patient has been adherent with treatment recommendations. Patient has been tolerating his medication well. No reported adverse effects or reactions.    Patient was discussed at the treatment team meeting this morning. Team members feel that patient is back to his baseline level of function. Team agrees with plan to discharge patient today to continue further substance abuse treatment as noted below. Bane was provided with a 31 days worth, supply of his [facility] discharge medications as he has no money to pay for his medications. This is to help him out so to be allowed into the substance abuse treatment program. He was able to engage in safety planning including plan to return to [facility] or contact  emergency services if he feels unable to maintain his own safety or the safety of others. Pt had no further questions, comments or concerns. He left [facility] with all personal belongings in no apparent distress.      Physical Findings: AIMS: Facial and Oral Movements Muscles of Facial Expression: None, normal Lips and Perioral Area: None, normal Jaw: None, normal Tongue: None, normal,Extremity Movements Upper (arms, wrists, hands, fingers): None, normal Lower (legs, knees, ankles, toes): None, normal, Trunk Movements Neck, shoulders, hips: None, normal, Overall Severity Severity of abnormal movements (highest score from questions above): None, normal Incapacitation due to abnormal movements: None, normal Patient's awareness of abnormal movements (rate only patient's report): No Awareness, Dental Status Current problems with teeth and/or dentures?: No Does patient usually wear dentures?: No  CIWA:  CIWA-Ar Total: 1 COWS:  COWS Total  Score: 2  Musculoskeletal: Strength & Muscle Tone: within normal limits Gait & Station: normal Patient leans: N/A  Psychiatric Specialty Exam: Physical Exam  Nursing note and vitals reviewed. Constitutional: He is oriented to person, place, and time. He appears well-developed and well-nourished.  HENT:  Head: Normocephalic.  Eyes: Pupils are equal, round, and reactive to light.  Neck: Normal range of motion.  Cardiovascular: Normal rate.  Respiratory: Effort normal.  GI: Soft.  Genitourinary:    Genitourinary Comments: Deferred   Musculoskeletal: Normal range of motion.  Neurological: He is alert and oriented to person, place, and time.  Skin: Skin is warm.    Review of Systems  Constitutional: Negative.   HENT: Negative.   Eyes: Negative.   Respiratory: Negative.  Negative for cough and shortness of breath.   Cardiovascular: Negative.  Negative for chest pain and palpitations (See sucicide SRA).  Gastrointestinal: Negative.  Negative for abdominal pain, heartburn, nausea and vomiting.  Genitourinary: Negative.   Musculoskeletal: Negative.   Skin: Negative.   Neurological: Negative.  Negative for dizziness and headaches.  Endo/Heme/Allergies: Negative.   Psychiatric/Behavioral: Positive for depression (Stable) and substance abuse ( Hx. Polysubstance use disorder including opioid drugs ). Negative for hallucinations, memory loss and suicidal ideas (    ). The patient has insomnia (Stable). The patient is not nervous/anxious (Stable).     Blood pressure 121/62, pulse 100, temperature 98 F (36.7 C), temperature source Oral, resp. rate 18, height 6\' 4"  (1.93 m), weight 108 kg.Body mass index is 28.97 kg/m.  See Md's discharge SRA   Have you used any form of tobacco in the last 30 days? (Cigarettes, Smokeless Tobacco, Cigars, and/or Pipes): Yes  Has this patient used any form of tobacco in the last 30 days? (Cigarettes, Smokeless Tobacco, Cigars, and/or Pipes): Yes, an  FDA-approved tobacco cessation medication was offered at discharge.  Blood Alcohol level:  Lab Results  Component Value Date   ETH <10 01/22/2018   ETH <10 12/02/2016    Metabolic Disorder Labs:  No results found for: HGBA1C, MPG No results found for: PROLACTIN No results found for: CHOL, TRIG, HDL, CHOLHDL, VLDL, LDLCALC  See Psychiatric Specialty Exam and Suicide Risk Assessment completed by Attending Physician prior to discharge.  Discharge destination:  Other:  Home, then to [facility] program.  Is patient on multiple antipsychotic therapies at discharge:  No   Has Patient had three or more failed trials of antipsychotic monotherapy by history:  No  Recommended Plan for Multiple Antipsychotic Therapies: NA  Allergies as of 01/29/2018   No Known Allergies     Medication List    STOP taking these  medications   cephALEXin 500 MG capsule Commonly known as:  KEFLEX   docusate sodium 100 MG capsule Commonly known as:  COLACE   oxyCODONE 5 MG immediate release tablet Commonly known as:  Oxy IR/ROXICODONE     TAKE these medications     Indication  albuterol 108 (90 Base) MCG/ACT inhaler Commonly known as:  PROVENTIL HFA;VENTOLIN HFA Inhale 1-2 puffs into the lungs every 6 (six) hours as needed for wheezing or shortness of breath.  Indication:  Asthma   buPROPion 300 MG 24 hr tablet Commonly known as:  WELLBUTRIN XL Take 1 tablet (300 mg total) by mouth daily. For depression Start taking on:  January 30, 2018 What changed:  additional instructions  Indication:  Major Depressive Disorder, mood stability   gabapentin 400 MG capsule Commonly known as:  NEURONTIN Take 1 capsule (400 mg total) by mouth 3 (three) times daily. For agitation  Indication:  Agitation   hydrOXYzine 25 MG tablet Commonly known as:  ATARAX/VISTARIL Take 1 tablet (25 mg total) by mouth every 6 (six) hours as needed for anxiety.  Indication:  Feeling Anxious   ibuprofen  400 MG tablet Commonly known as:  ADVIL,MOTRIN Take 1 tablet (400 mg total) by mouth every 6 (six) hours as needed for moderate pain.  Indication:  Pain   mirtazapine 30 MG tablet Commonly known as:  REMERON Take 1 tablet (30 mg total) by mouth at bedtime. For depression What changed:    medication strength  how much to take  additional instructions  Indication:  Major Depressive Disorder   nicotine 21 mg/24hr patch Commonly known as:  NICODERM CQ - dosed in mg/24 hours Place 1 patch (21 mg total) onto the skin daily. (May buy from over the counter): For smoking cessation Start taking on:  January 30, 2018  Indication:  Nicotine Addiction   pantoprazole 40 MG tablet Commonly known as:  PROTONIX Take 1 tablet (40 mg total) by mouth daily. For acid reflux Start taking on:  January 30, 2018  Indication:  Gastroesophageal Reflux Disease   QUEtiapine 200 MG tablet Commonly known as:  SEROQUEL Take 1 tablet (200 mg total) by mouth at bedtime. For mood control  Indication:  Mood control   sulfamethoxazole-trimethoprim 800-160 MG tablet Commonly known as:  BACTRIM DS,SEPTRA DS Take 1 tablet by mouth every 12 (twelve) hours. For infection  Indication:  Infection   traZODone 100 MG tablet Commonly known as:  DESYREL Take 1 tablet (100 mg total) by mouth at bedtime as needed for sleep. What changed:    medication strength  how much to take  Indication:  Trouble Sleeping      Follow-up Information    Services, [facility]. Go on 01/31/2018.   Why:  Screening for possible admission on Wednesday,  1/22 at 7:45a. Please bring photo ID, proof of [facility] resideny, 30 day supply of medications, and clothing. Contact information: [address] [place] [address] [phone]        [facility]. Go to.   Why:  Please follow up during walk-in hours, Monday-Friday 8:00a-3:00p. Thank you.  Contact information: [address] [place]  [phone]          Follow-up recommendations:  Activity:  As tolerated Diet: As recommended by your primary care doctor. Keep all scheduled follow-up appointments as recommended.  Comments:  Patient is instructed prior to discharge to: Take all medications as prescribed by his/her mental healthcare provider. Report any adverse effects  and or reactions from the medicines to his/her outpatient provider promptly. Patient has been instructed & cautioned: To not engage in alcohol and or illegal drug use while on prescription medicines. In the event of worsening symptoms, patient is instructed to call the crisis hotline, 911 and or go to the nearest ED for appropriate evaluation and treatment of symptoms. To follow-up with his/her primary care provider for your other medical issues, concerns and or health care needs.   Signed: Armandina StammerAgnes Paulene Tayag, NP, PMHNP, FNP-BC 01/29/2018, 11:11 AM

## 2018-01-29 NOTE — Progress Notes (Signed)
Patient ID: Logan Hancock, male   DOB: [birth year], 28 y.o.   MRN: [id number]  Pt discharged to lobby. Pt was stable and appreciative at that time. All papers and prescriptions were given and valuables returned. Verbal understanding expressed. Denies SI/HI and A/VH. Pt given opportunity to express concerns and ask questions.

## 2018-01-29 NOTE — Progress Notes (Signed)
  [facility] Adult Case Management Discharge Plan :  Will you be returning to the same living situation after discharge:  No. CSW to provide shelter resources. At discharge, do you have transportation home?: Yes,  2 bus passes Do you have the ability to pay for your medications: No. No income. Pharmacy to provide 7 day medication sample.  Release of information consent forms completed and in the chart; 2 letters and 2 bus passes on chart.  Patient to Follow up at: Follow-up Information    [facility], [facility]. Go on 01/31/2018.   Why:  Screening for possible admission on Wednesday,  1/22 at 7:45a. Please bring photo ID, proof of [facility] resideny, 30 day supply of medications, and clothing. Contact information: [address] [place] [address] [phone]        [facility]. Go to.   Why:  Please follow up during walk-in hours, Monday-Friday 8:00a-3:00p. Thank you.  Contact information: [address] [place] [address] [phone]           Next level of care provider has access to [facility] Link:no  Safety Planning and Suicide Prevention discussed: Yes,  with patient.  Have you used any form of tobacco in the last 30 days? (Cigarettes, Smokeless Tobacco, Cigars, and/or Pipes): Yes  Has patient been referred to the Quitline?: Patient refused referral  Patient has been referred for addiction treatment: Yes  Darreld Mclean, LCSWA 01/29/2018, 9:19 AM

## 2018-01-29 NOTE — Progress Notes (Signed)
D: Patient observed up and restless on the unit. Cursing frequently, attention seeking. "This place fucking sucks. That doctor doesn't care about me. I don't have a [facility] appointment until Thursday and he doesn't care about putting me out. I have nowhere to go. And these meds? They are a joke. I'm not trying to be an asshole." Patient's affect animated, mood angry however patient social and playing cards in dayroom much of the night. Continues to report constant R shoulder pain and states, "I told them that damn advil doesn't work and that I need a shot." No other physical complaints.   A: Medicated per orders. Medication education provided. Redirected behavior and language. Level III obs in place for safety. Emotional support offered. Patient encouraged to complete Suicide Safety Plan before discharge. Encouraged to attend and participate in unit programming.   R: Patient verbalizes understanding of POC but remains resistant to kind but firm limits. Patient denies SI/HI/AVH and remains safe on level III obs. Will continue to monitor throughout the night.

## 2018-01-29 NOTE — [facility] Suicide Risk Assessment (Signed)
[facility] Discharge Suicide Risk Assessment   Principal Problem: Heroin use disorder, severe (HCC) Discharge Diagnoses: Principal Problem:   Heroin use disorder, severe (HCC) Active Problems:   Substance induced mood disorder (HCC)   Severe recurrent major depression without psychotic features (HCC)   Total Time spent with patient: 15 minutes  Musculoskeletal: Strength & Muscle Tone: within normal limits Gait & Station: normal Patient leans: N/A  Psychiatric Specialty Exam: Review of Systems  All other systems reviewed and are negative.   Blood pressure 121/62, pulse 100, temperature 98 F (36.7 C), temperature source Oral, resp. rate 18, height 6\' 4"  (1.93 m), weight 108 kg.Body mass index is 28.97 kg/m.  General Appearance: Casual  Eye Contact::  Fair  Speech:  Normal Rate409  Volume:  Normal  Mood:  Irritable  Affect:  Congruent  Thought Process:  Coherent and Descriptions of Associations: Intact  Orientation:  Full (Time, Place, and Person)  Thought Content:  Logical  Suicidal Thoughts:  No  Homicidal Thoughts:  No  Memory:  Immediate;   Fair Recent;   Fair Remote;   Fair  Judgement:  Intact  Insight:  Lacking  Psychomotor Activity:  Normal  Concentration:  Fair  Recall:  [employer] of Knowledge:Fair  Language: Fair  Akathisia:  Negative  Handed:  Right  AIMS (if indicated):     Assets:  Desire for Improvement Physical Health Resilience  Sleep:  Number of Hours: 6.25  Cognition: WNL  ADL's:  Intact   Mental Status Per Nursing Assessment::   On Admission:  Self-harm thoughts, Self-harm behaviors  Demographic Factors:  Male, Caucasian, Low socioeconomic status, Living alone and Unemployed  Loss Factors: Financial problems/change in socioeconomic status  Historical Factors: Impulsivity  Risk Reduction Factors:   NA  Continued Clinical Symptoms:  Bipolar Disorder:   Bipolar II Alcohol/Substance Abuse/Dependencies  Cognitive Features That Contribute  To Risk:  Thought constriction (tunnel vision)    Suicide Risk:  Minimal: No identifiable suicidal ideation.  Patients presenting with no risk factors but with morbid ruminations; may be classified as minimal risk based on the severity of the depressive symptoms  Follow-up Information    [facility]. Go on 01/31/2018.   Why:  Screening for possible admission on Wednesday,  1/22 at 7:45a. Please bring photo ID, proof of [facility] resideny, 30 day supply of medications, and clothing. Contact information: [address] [place] [address] [phone]        [facility]. Go to.   Why:  Please follow up during walk-in hours, Monday-Friday 8:00a-3:00p. Thank you.  Contact information: [address] [place] [address] [phone]           Plan Of Care/Follow-up recommendations:  Activity:  ad lib  Antonieta Pert, MD 01/29/2018, 7:37 AM

## 2018-01-29 NOTE — Progress Notes (Signed)
Patient ID: TAE LIBRIZZI, male   DOB: [birth year], 28 y.o.   MRN: [id number]  Pt currently presents with a flat affect and irritable behavior. Pt reports to writer that their goal is to "figure out what I am going to do before getting into the program." Pt continues to curse at the nurses station and in the dayroom despite attempts to redirect. Pt mood remains labile, resistant to care. Pt reports good sleep with current medication regimen.   Pt provided with medications per providers orders. Pt's labs and vitals were monitored throughout the night. Pt supported emotionally and encouraged to express concerns and questions. Pt educated on medications and suicide prevention precuations.   Pt's safety ensured with 15 minute and environmental checks. Pt currently denies SI/HI and A/V hallucinations. Pt verbally agrees to seek staff if SI/HI or A/VH occurs and to consult with staff before acting on any harmful thoughts. Pt to be discharged per MD. Will continue POC.

## 2018-01-29 NOTE — Progress Notes (Signed)
The patient attended the evening A.A.meeting and was appropriate.  

## 2018-02-16 ENCOUNTER — Other Ambulatory Visit: Payer: Self-pay

## 2018-02-16 ENCOUNTER — Emergency Department (HOSPITAL_COMMUNITY): Payer: Self-pay

## 2018-02-16 ENCOUNTER — Emergency Department (HOSPITAL_COMMUNITY)
Admission: EM | Admit: 2018-02-16 | Discharge: 2018-02-16 | Disposition: A | Discharge status: Home / Self Care | Payer: Self-pay | Attending: Emergency Medicine | Admitting: Emergency Medicine

## 2018-02-16 DIAGNOSIS — F1920 Other psychoactive substance dependence, uncomplicated: Secondary | ICD-10-CM | POA: Insufficient documentation

## 2018-02-16 DIAGNOSIS — F419 Anxiety disorder, unspecified: Secondary | ICD-10-CM | POA: Insufficient documentation

## 2018-02-16 DIAGNOSIS — R45851 Suicidal ideations: Secondary | ICD-10-CM | POA: Insufficient documentation

## 2018-02-16 DIAGNOSIS — Z79899 Other long term (current) drug therapy: Secondary | ICD-10-CM | POA: Insufficient documentation

## 2018-02-16 DIAGNOSIS — J45909 Unspecified asthma, uncomplicated: Secondary | ICD-10-CM | POA: Insufficient documentation

## 2018-02-16 DIAGNOSIS — Z7289 Other problems related to lifestyle: Secondary | ICD-10-CM | POA: Insufficient documentation

## 2018-02-16 DIAGNOSIS — F17210 Nicotine dependence, cigarettes, uncomplicated: Secondary | ICD-10-CM | POA: Insufficient documentation

## 2018-02-16 DIAGNOSIS — Z046 Encounter for general psychiatric examination, requested by authority: Secondary | ICD-10-CM | POA: Insufficient documentation

## 2018-02-16 DIAGNOSIS — F329 Major depressive disorder, single episode, unspecified: Secondary | ICD-10-CM | POA: Insufficient documentation

## 2018-02-16 LAB — RAPID URINE DRUG SCREEN, HOSP PERFORMED
Amphetamines: POSITIVE — AB
Barbiturates: NOT DETECTED
Benzodiazepines: NOT DETECTED
Cocaine: NOT DETECTED
Opiates: NOT DETECTED
Tetrahydrocannabinol: NOT DETECTED

## 2018-02-16 LAB — BASIC METABOLIC PANEL
Anion gap: 9 (ref 5–15)
BUN: 14 mg/dL (ref 6–20)
CO2: 22 mmol/L (ref 22–32)
Calcium: 8.9 mg/dL (ref 8.9–10.3)
Chloride: 107 mmol/L (ref 98–111)
Creatinine, Ser: 0.76 mg/dL (ref 0.61–1.24)
GFR calc Af Amer: >60 mL/min (ref >60)
GLUCOSE: 82 mg/dL (ref 70–99)
Potassium: 3.9 mmol/L (ref 3.5–5.1)
Sodium: 138 mmol/L (ref 135–145)

## 2018-02-16 LAB — CBC
HEMATOCRIT: 46.1 % (ref 39.0–52.0)
Hemoglobin: 14.2 g/dL (ref 13.0–17.0)
MCH: 26.8 pg (ref 26.0–34.0)
MCHC: 30.8 g/dL (ref 30.0–36.0)
MCV: 87 fL (ref 80.0–100.0)
Platelets: 168 10*3/uL (ref 150–400)
RBC: 5.3 MIL/uL (ref 4.22–5.81)
RDW: 15.2 % (ref 11.5–15.5)
WBC: 6.4 10*3/uL (ref 4.0–10.5)
nRBC: 0 % (ref 0.0–0.2)

## 2018-02-16 LAB — SALICYLATE LEVEL: Salicylate Lvl: <7 mg/dL (ref 2.8–30.0)

## 2018-02-16 LAB — I-STAT TROPONIN, ED: Troponin i, poc: 0.01 ng/mL (ref 0.00–0.08)

## 2018-02-16 LAB — ETHANOL: Alcohol, Ethyl (B): <10 mg/dL (ref <10)

## 2018-02-16 LAB — ACETAMINOPHEN LEVEL: Acetaminophen (Tylenol), Serum: <10 ug/mL — ABNORMAL LOW (ref 10–30)

## 2018-02-16 MED ORDER — HYDROXYZINE HCL 25 MG PO TABS: 25.0000 mg | ORAL_TABLET | Freq: Four times a day (QID) | ORAL | 0 refills | Status: AC | PRN

## 2018-02-16 MED ORDER — ACETAMINOPHEN 325 MG PO TABS: 650.0000 mg | ORAL_TABLET | Freq: Four times a day (QID) | ORAL | 0 refills | Status: AC | PRN

## 2018-02-16 MED ORDER — SODIUM CHLORIDE 0.9% FLUSH: 3.0000 mL | Freq: Once | INTRAVENOUS | Status: DC

## 2018-02-16 MED ORDER — GABAPENTIN 400 MG PO CAPS: 400.0000 mg | ORAL_CAPSULE | Freq: Three times a day (TID) | ORAL | 0 refills | Status: AC

## 2018-02-16 MED ORDER — ACETAMINOPHEN 500 MG PO TABS
1000.0000 mg | ORAL_TABLET | Freq: Once | ORAL | Status: AC
  Administered 2018-02-16: 1000 mg via ORAL
  Filled 2018-02-16: qty 2

## 2018-02-16 MED ORDER — NICOTINE 21 MG/24HR TD PT24
21.0000 mg | MEDICATED_PATCH | Freq: Once | TRANSDERMAL | Status: DC
  Administered 2018-02-16: 21 mg via TRANSDERMAL
  Filled 2018-02-16: qty 1

## 2018-02-16 NOTE — [facility] Suicide Risk Assessment (Cosign Needed)
Suicide Risk Assessment  Discharge Assessment   [facility] Discharge Suicide Risk Assessment   Principal Problem: <principal problem not specified> Discharge Diagnoses: Active Problems:   * No active hospital problems. *   Total Time spent with patient: 30 minutes  Musculoskeletal: Strength & Muscle Tone: within normal limits Gait & Station: normal Patient leans: N/A  Psychiatric Specialty Exam:   Blood pressure (!) 137/55, pulse 99, temperature 97.8 F (36.6 C), temperature source Oral, resp. rate 16, height 6\' 4"  (1.93 m), weight 102.1 kg, SpO2 99 %.Body mass index is 27.4 kg/m.  General Appearance: Casual  Eye Contact::  Good  Speech:  Clear and Coherent and Normal Rate409  Volume:  Normal  Mood:  Anxious and Depressed  Affect:  Congruent and Depressed  Thought Process:  Coherent, Goal Directed, Linear and Descriptions of Associations: Intact  Orientation:  Full (Time, Place, and Person)  Thought Content:  Logical  Suicidal Thoughts:  No  Homicidal Thoughts:  No  Memory:  Immediate;   Good Recent;   Good Remote;   Fair  Judgement:  Fair  Insight:  Fair  Psychomotor Activity:  Normal  Concentration:  Fair  Recall:  [employer] of Knowledge:Good  Language: Good  Akathisia:  No  Handed:  Right  AIMS (if indicated):     Assets:  Communication Skills Desire for Improvement Financial Resources/Insurance Physical Health Vocational/Educational  Sleep:     Cognition: WNL  ADL's:  Intact   Mental Status Per Nursing Assessment::   On Admission:   Pt was admitted to [facility] after he injected heroin and meth in a suicide attempt. He was inpatient at [facility] from 1-14 to 1-20-202 and was given follow up instructions for [facility] and [facility]. He was also provided with enough medication samples to get him to his appointments and establish care. He failed to follow up with outpatient providers and continues to abuse drugs. Today he stated he is waiting for his father to come and get him form  [place] and take him with him on the road to work. He is looking for a place to stay until his father arrives as he is homeless. He was offered to meet with Peer Support for substance abuse treatment options in the community and possible placement in a rehab facility. He was also offered the option of going inpatient for 3-5 days to get stabilized again and get back on his medications. He declined both offers and is able to contract for safety. He is requesting to be discharged. Pt will be given outpatient resources again for [facility]. Pt is psychiatrically clear.  Demographic Factors:  Male, Caucasian and Unemployed  Loss Factors: Financial problems/change in socioeconomic status  Historical Factors: Family history of mental illness or substance abuse  Risk Reduction Factors:   Sense of responsibility to family  Continued Clinical Symptoms:  Alcohol/Substance Abuse/Dependencies  Cognitive Features That Contribute To Risk:  Closed-mindedness    Suicide Risk:  Minimal: No identifiable suicidal ideation.  Patients presenting with no risk factors but with morbid ruminations; may be classified as minimal risk based on the severity of the depressive symptoms    Plan Of Care/Follow-up recommendations:  Activity:  as tolerated Diet:  heart healthy  Laveda Abbe, NP 02/16/2018, 12:29 PM

## 2018-02-16 NOTE — ED Notes (Signed)
PT have been made aware of urine sample.Will info staff when able to provide 

## 2018-02-16 NOTE — ED Notes (Signed)
Bed: WA06 Expected date:  Expected time:  Means of arrival:  Comments: 28 yr old heart palpitations/meth heroin

## 2018-02-16 NOTE — ED Notes (Signed)
Bed: [facility] Expected date:  Expected time:  Means of arrival:  Comments: Room 4
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
## 2018-02-16 NOTE — Discharge Instructions (Signed)
For your mental health needs, you are advised to follow up with [facility].  New and returning patients are seen at their walk-in clinic.  Walk-in hours are Monday - Friday from 8:00 am - 3:00 pm.  Walk-in patients are seen on a first come, first served basis.  Try to arrive as early as possible for the best chance of being seen the same day:       [facility]      [address]      [place], [place] [address]      [phone]  To help you maintain a sober lifestyle, a substance abuse treatment program may be beneficial to you.  Contact [facility] at your earliest opportunity to ask about enrolling in their program:       [facility]      [address], [place] [address]      [phone]      New patients are seen at the walk-in clinic every Tuesday from 9:00 am - 12:00 pm

## 2018-02-16 NOTE — ED Notes (Signed)
Writer attempt to collect labs unable to collect enough for test

## 2018-02-16 NOTE — BH Assessment (Signed)
[facility] Assessment Progress Note Case was staffed with Arville Care FNP, Lucianne Muss MD who recommended patient be observed and monitored for safety.

## 2018-02-16 NOTE — BH Assessment (Signed)
[facility] Assessment Progress Note  Per Nelly Rout, MD this pt would benefit from psychiatric hospitalization at this time, but he does not meet criteria for IVC.  This Clinical research associate spoke to pt.  He initially reports that he wants to be hospitalized, but after further consideration, calls me back to his bedside.  He reports that he feels safe at this time, and that he wants to be discharged.  This has been staffed with Elta Guadeloupe, FNP, and she agrees to discharge pt with outpatient referrals.  Discharge instructions include information for [facility] and for [facility].  Pt's nurse has been notified.  Doylene Canning, MA Triage Specialist [phone]

## 2018-02-16 NOTE — ED Notes (Signed)
Pt states he wishes to leave

## 2018-02-16 NOTE — ED Notes (Signed)
Bed: WA04 Expected date:  Expected time:  Means of arrival:  Comments: 

## 2018-02-16 NOTE — ED Triage Notes (Signed)
Patient brought in by [facility]. Patient started having heart palpitations due to shooting up heroine and meth together. Alert and oriented x 4.

## 2018-02-16 NOTE — ED Notes (Signed)
Requested patient to urinate. Gave patient a urinal. 

## 2018-02-16 NOTE — ED Provider Notes (Signed)
[facility]-EMERGENCY DEPT Provider Note   CSN: [id number] Arrival date & time: 02/16/18  0413     History   Chief Complaint Chief Complaint  Patient presents with  . Palpitations    HPI Logan Hancock is a 28 y.o. male.  Patient to ED after injecting heroin and meth in a suicide attempt. He denies other attempt at self harm/injury. He was recently admitted to [facility] for SI, discharged 01/29/2018, and reports he relapsed the day of discharge. No HI/AVH.   The history is provided by the patient. No language interpreter was used.  Palpitations    Past Medical History:  Diagnosis Date  . Asthma   . Bipolar 1 disorder (HCC)   . Drug addiction (HCC)   . MRSA (methicillin resistant staph aureus) culture positive     Patient Active Problem List   Diagnosis Date Noted  . Septic arthritis of shoulder, right (HCC) 12/28/2017  . Hyponatremia 12/28/2017  . Infection of shoulder (HCC) 12/28/2017  . Sepsis (HCC) 12/27/2017  . Severe recurrent major depression without psychotic features (HCC) 12/03/2016  . Heroin use disorder, severe (HCC) 12/03/2016  . Substance induced mood disorder (HCC) 10/14/2016  . Polysubstance abuse (HCC) 10/14/2016  . Suicidal ideation     Past Surgical History:  Procedure Laterality Date  . SHOULDER ARTHROSCOPY Right 12/28/2017   Procedure: ARTHROSCOPY SHOULDER  incision and drainage right shoulder and open incision and drainage shoulder joint;  Surgeon: Beverely Low, MD;  Location: [facility]S;  Service: Orthopedics;  Laterality: Right;  . TEE WITHOUT CARDIOVERSION N/A 01/09/2018   Procedure: TRANSESOPHAGEAL ECHOCARDIOGRAM (TEE);  Surgeon: Jake Bathe, MD;  Location: [facility] ENDOSCOPY;  Service: Cardiovascular;  Laterality: N/A;  with anesthesia        Home Medications    Prior to Admission medications   Medication Sig Start Date End Date Taking? Authorizing Provider  albuterol (PROVENTIL HFA;VENTOLIN HFA) 108 (90 Base) MCG/ACT inhaler  Inhale 1-2 puffs into the lungs every 6 (six) hours as needed for wheezing or shortness of breath. 01/29/18   Armandina Stammer I, NP  buPROPion (WELLBUTRIN XL) 300 MG 24 hr tablet Take 1 tablet (300 mg total) by mouth daily. For depression 01/30/18   Armandina Stammer I, NP  gabapentin (NEURONTIN) 400 MG capsule Take 1 capsule (400 mg total) by mouth 3 (three) times daily. For agitation 01/29/18   Armandina Stammer I, NP  hydrOXYzine (ATARAX/VISTARIL) 25 MG tablet Take 1 tablet (25 mg total) by mouth every 6 (six) hours as needed for anxiety. 01/29/18   Armandina Stammer I, NP  ibuprofen (ADVIL,MOTRIN) 400 MG tablet Take 1 tablet (400 mg total) by mouth every 6 (six) hours as needed for moderate pain. 01/29/18   Armandina Stammer I, NP  mirtazapine (REMERON) 30 MG tablet Take 1 tablet (30 mg total) by mouth at bedtime. For depression 01/29/18   Armandina Stammer I, NP  nicotine (NICODERM CQ - DOSED IN MG/24 HOURS) 21 mg/24hr patch Place 1 patch (21 mg total) onto the skin daily. (May buy from over the counter): For smoking cessation 01/30/18   Armandina Stammer I, NP  pantoprazole (PROTONIX) 40 MG tablet Take 1 tablet (40 mg total) by mouth daily. For acid reflux 01/30/18   Armandina Stammer I, NP  QUEtiapine (SEROQUEL) 200 MG tablet Take 1 tablet (200 mg total) by mouth at bedtime. For mood control 01/29/18   Armandina Stammer I, NP  sulfamethoxazole-trimethoprim (BACTRIM DS,SEPTRA DS) 800-160 MG tablet Take 1 tablet by mouth every  12 (twelve) hours. For infection 01/29/18   Armandina Stammer I, NP  traZODone (DESYREL) 100 MG tablet Take 1 tablet (100 mg total) by mouth at bedtime as needed for sleep. 01/29/18   Sanjuana Kava, NP    Family History No family history on file.  Social History Social History   Tobacco Use  . Smoking status: Current Every Day Smoker    Packs/day: 1.00    Years: 2.00    Pack years: 2.00    Types: Cigarettes  . Smokeless tobacco: Never Used  Substance Use Topics  . Alcohol use: No    Frequency: Never  . Drug use: Yes      Frequency: 3.0 times per week    Types: Marijuana, Cocaine, IV    Comment: last crack use 01/18/17, pt states he has been using heroin for pain.     Allergies   Patient has no known allergies.   Review of Systems Review of Systems  Constitutional: Negative for chills and fever.  HENT: Negative.   Respiratory: Negative.   Gastrointestinal: Negative.   Musculoskeletal: Negative.   Skin: Negative.   Neurological: Negative.   Psychiatric/Behavioral: Positive for self-injury and suicidal ideas.     Physical Exam Updated Vital Signs BP 134/82 (BP Location: Right Arm)   Pulse 77   Temp 97.8 F (36.6 C) (Oral)   Resp 15   Ht 6\' 4"  (1.93 m)   Wt 102.1 kg   SpO2 99%   BMI 27.40 kg/m   Physical Exam Vitals signs and nursing note reviewed.  Constitutional:      Appearance: He is well-developed.  HENT:     Head: Normocephalic.  Neck:     Musculoskeletal: Normal range of motion and neck supple.  Cardiovascular:     Rate and Rhythm: Normal rate and regular rhythm.  Pulmonary:     Effort: Pulmonary effort is normal.     Breath sounds: Normal breath sounds.  Abdominal:     General: Bowel sounds are normal.     Palpations: Abdomen is soft.     Tenderness: There is no abdominal tenderness. There is no guarding or rebound.  Musculoskeletal: Normal range of motion.  Skin:    General: Skin is warm and dry.     Findings: No rash.  Neurological:     Mental Status: He is alert and oriented to person, place, and time.  Psychiatric:        Attention and Perception: He does not perceive auditory hallucinations.        Mood and Affect: Affect is angry.        Thought Content: Thought content includes suicidal ideation.      ED Treatments / Results  Labs (all labs ordered are listed, but only abnormal results are displayed) Labs Reviewed  BASIC METABOLIC PANEL  CBC  ACETAMINOPHEN LEVEL  SALICYLATE LEVEL  RAPID URINE DRUG SCREEN, HOSP PERFORMED  ETHANOL  I-STAT  TROPONIN, ED    EKG None  Radiology No results found.  Procedures Procedures (including critical care time)  Medications Ordered in ED Medications  sodium chloride flush (NS) 0.9 % injection 3 mL (has no administration in time range)     Initial Impression / Assessment and Plan / ED Course  I have reviewed the triage vital signs and the nursing notes.  Pertinent labs & imaging results that were available during my care of the patient were reviewed by me and considered in my medical decision making (see chart for details).  Patient to ED after injecting meth and heroin stating he was trying to kill himself by drug overdose. History of suicidal ideation, [facility].  VSS. He is awake, alert, oriented. Labs pending for medical clearance.   Labs resulted and reviewed. He is considered medically cleared for TTS consultation to determine appropriate disposition.   Final Clinical Impressions(s) / ED Diagnoses   Final diagnoses:  None   1. Suicidal ideation 2. Drug addiction  ED Discharge Orders    None       Elpidio AnisUpstill, Susie Pousson, Cordelia Poche-C 02/16/18 0606    Sabas SousBero, Michael M, MD 02/16/18 [phone]

## 2018-02-16 NOTE — ED Notes (Signed)
Pt belongings returned

## 2018-02-16 NOTE — ED Notes (Signed)
Nurse Practitioner at bedside.

## 2018-02-16 NOTE — ED Notes (Signed)
Patient aware we need urine sample. Patient will call out when ready to void. Urinal at bedside.

## 2019-05-06 ENCOUNTER — Encounter (HOSPITAL_BASED_OUTPATIENT_CLINIC_OR_DEPARTMENT_OTHER): Payer: Self-pay

## 2019-05-06 ENCOUNTER — Emergency Department (HOSPITAL_BASED_OUTPATIENT_CLINIC_OR_DEPARTMENT_OTHER)
Admission: EM | Admit: 2019-05-06 | Discharge: 2019-05-06 | Disposition: A | Discharge status: Home / Self Care | Payer: Self-pay | Attending: Emergency Medicine | Admitting: Emergency Medicine

## 2019-05-06 ENCOUNTER — Other Ambulatory Visit: Payer: Self-pay

## 2019-05-06 DIAGNOSIS — R112 Nausea with vomiting, unspecified: Secondary | ICD-10-CM | POA: Insufficient documentation

## 2019-05-06 DIAGNOSIS — R109 Unspecified abdominal pain: Secondary | ICD-10-CM | POA: Insufficient documentation

## 2019-05-06 DIAGNOSIS — Z5321 Procedure and treatment not carried out due to patient leaving prior to being seen by health care provider: Secondary | ICD-10-CM | POA: Insufficient documentation

## 2019-05-06 LAB — COMPREHENSIVE METABOLIC PANEL
ALT: 30 U/L (ref 0–44)
AST: 28 U/L (ref 15–41)
Albumin: 3.9 g/dL (ref 3.5–5.0)
Alkaline Phosphatase: 95 U/L (ref 38–126)
Anion gap: 10 (ref 5–15)
BUN: 9 mg/dL (ref 6–20)
CO2: 26 mmol/L (ref 22–32)
Calcium: 9.2 mg/dL (ref 8.9–10.3)
Chloride: 97 mmol/L — ABNORMAL LOW (ref 98–111)
Creatinine, Ser: 0.72 mg/dL (ref 0.61–1.24)
GFR calc Af Amer: >60 mL/min (ref >60)
GFR calc non Af Amer: >60 mL/min (ref >60)
Glucose, Bld: 96 mg/dL (ref 70–99)
Potassium: 4.6 mmol/L (ref 3.5–5.1)
Sodium: 133 mmol/L — ABNORMAL LOW (ref 135–145)
Total Bilirubin: 1.8 mg/dL — ABNORMAL HIGH (ref 0.3–1.2)
Total Protein: 8.5 g/dL — ABNORMAL HIGH (ref 6.5–8.1)

## 2019-05-06 LAB — CBC
HCT: 46.5 % (ref 39.0–52.0)
Hemoglobin: 15.1 g/dL (ref 13.0–17.0)
MCH: 26.9 pg (ref 26.0–34.0)
MCHC: 32.5 g/dL (ref 30.0–36.0)
MCV: 82.9 fL (ref 80.0–100.0)
Platelets: 198 10*3/uL (ref 150–400)
RBC: 5.61 MIL/uL (ref 4.22–5.81)
RDW: 14.2 % (ref 11.5–15.5)
WBC: 7.9 10*3/uL (ref 4.0–10.5)
nRBC: 0 % (ref 0.0–0.2)

## 2019-05-06 LAB — URINALYSIS, ROUTINE W REFLEX MICROSCOPIC
Bilirubin Urine: NEGATIVE
Glucose, UA: NEGATIVE mg/dL
Hgb urine dipstick: NEGATIVE
Ketones, ur: NEGATIVE mg/dL
Leukocytes,Ua: NEGATIVE
Nitrite: NEGATIVE
Protein, ur: NEGATIVE mg/dL
Specific Gravity, Urine: 1.02 (ref 1.005–1.030)
pH: 7 (ref 5.0–8.0)

## 2019-05-06 LAB — LIPASE, BLOOD: Lipase: 23 U/L (ref 11–51)

## 2019-05-06 MED ORDER — SODIUM CHLORIDE 0.9% FLUSH
3.0000 mL | Freq: Once | INTRAVENOUS | Status: DC
  Filled 2019-05-06: qty 3

## 2019-05-06 NOTE — ED Triage Notes (Signed)
Pt c/o abd pain, n/v, sweats x 3 days-NAD-steady gait

## 2019-09-15 IMAGING — MR MR SHOULDER*R* W/O CM
4 of 5 series · 19 of 40 positions shown · non-contrast
Comparison: Right shoulder x-rays from same day.

CLINICAL DATA: Severe right shoulder pain. Sepsis. History of IV
drug abuse.

EXAM:
MRI OF THE RIGHT SHOULDER WITHOUT CONTRAST
TECHNIQUE: Multiplanar, multisequence MR imaging of the shoulder was performed.
No intravenous contrast was administered.

[Series 3: PD · axial · 4.0mm · 0.27mm/px · z∈[-34,+55]mm · 5 of 26 slices shown]
[im 1/26]
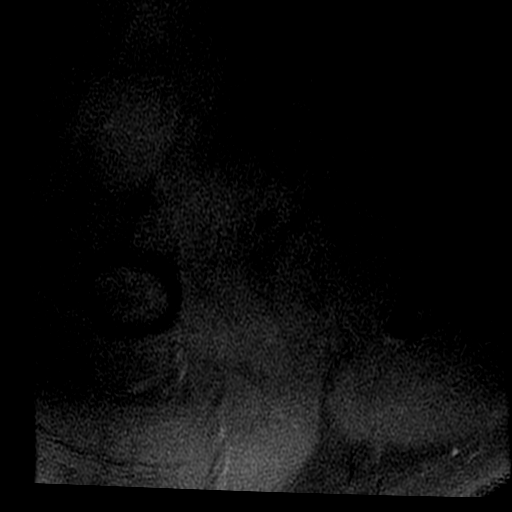
[im 4/26]
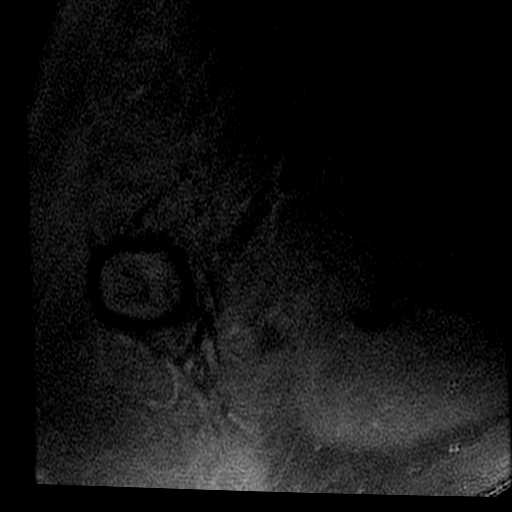
[im 8/26]
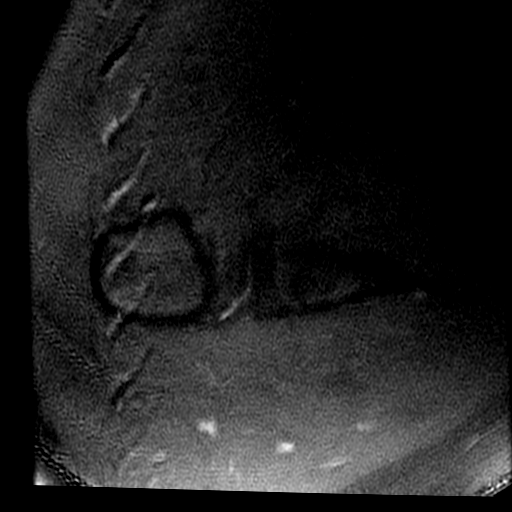
[im 15/26]
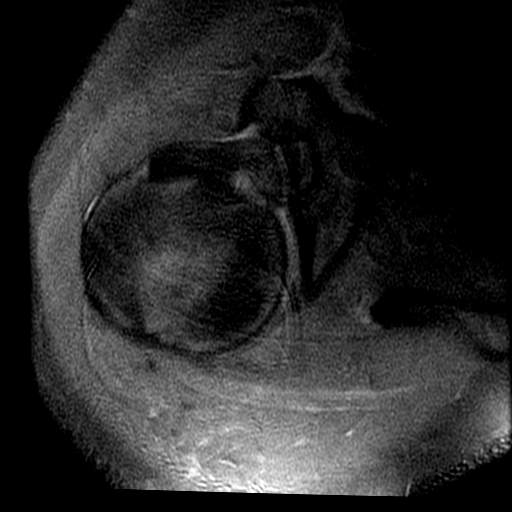
[im 22/26]
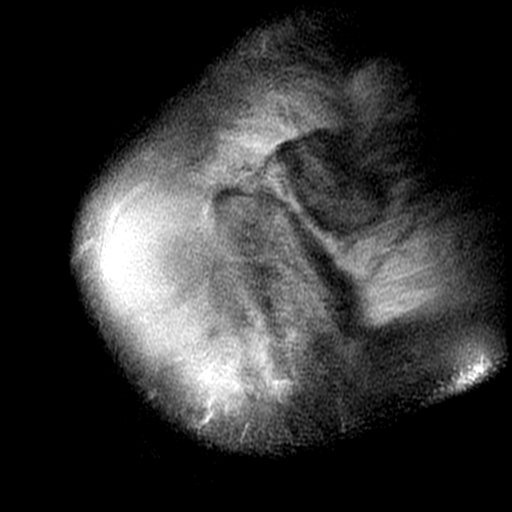

[Series 4: T2 fat-sat · oblique · 4.0mm · 0.27mm/px · 3 of 24 slices shown (1 of 2)]
[im 4/24]
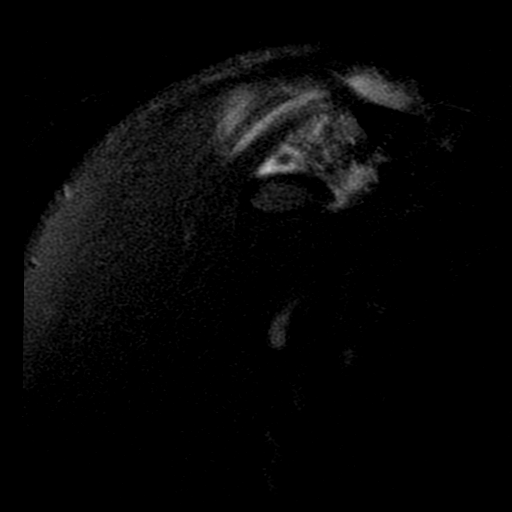
[im 14/24]
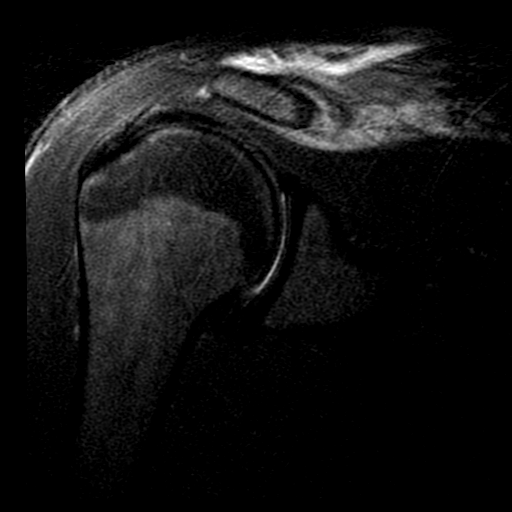
[im 20/24]
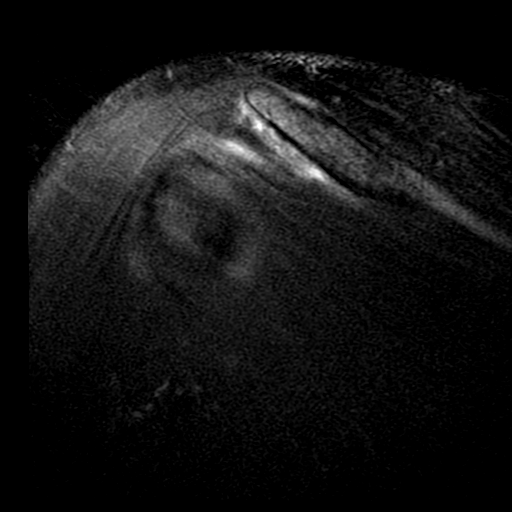

[Series 5: PD fat-sat · oblique · 4.0mm · 0.27mm/px · 8 of 24 slices shown]
[im 1/24]
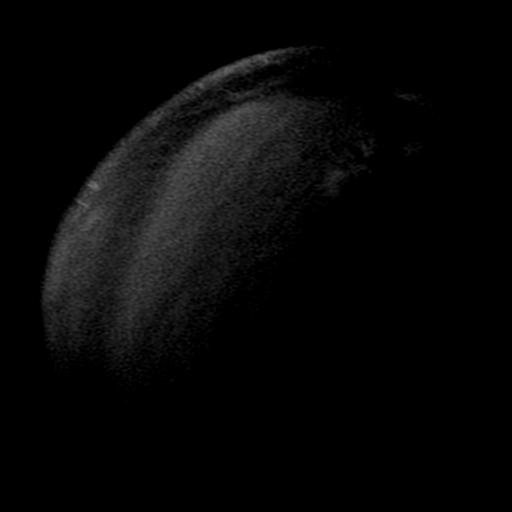
[im 4/24]
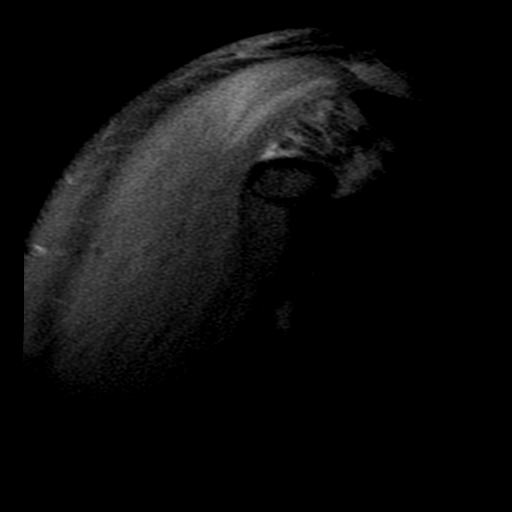
[im 7/24]
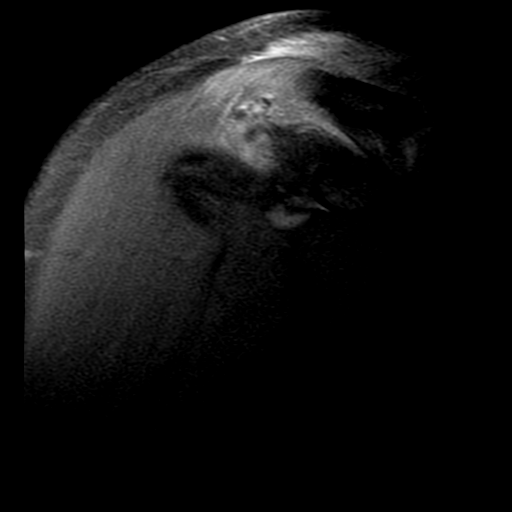
[im 10/24]
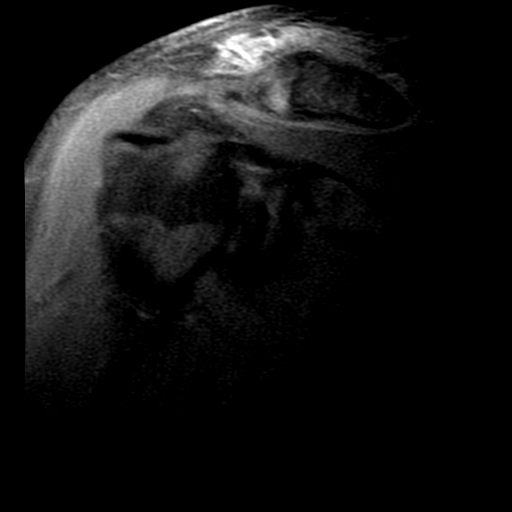
[im 14/24]
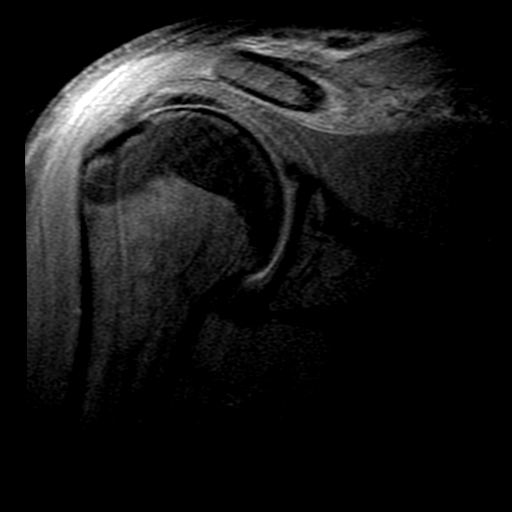
[im 17/24]
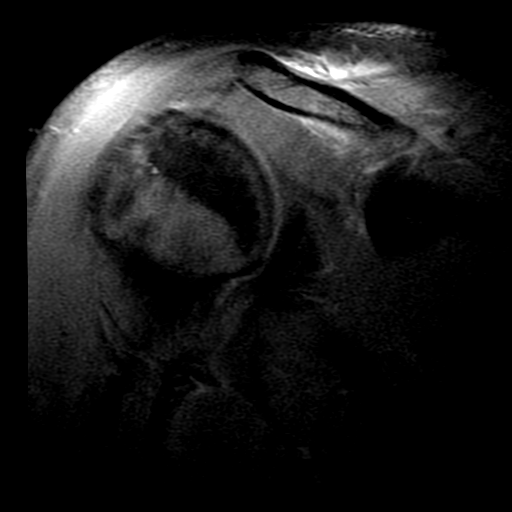
[im 20/24]
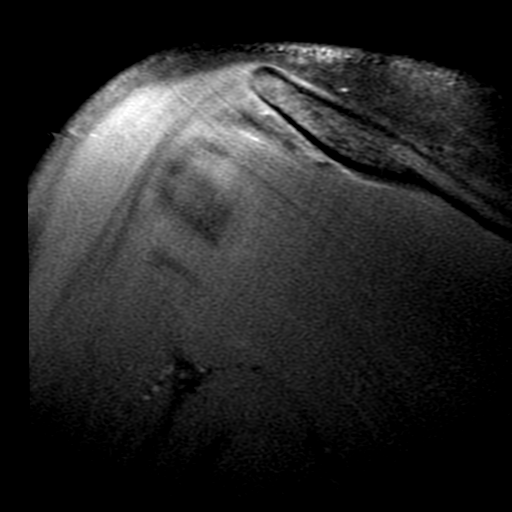
[im 24/24]
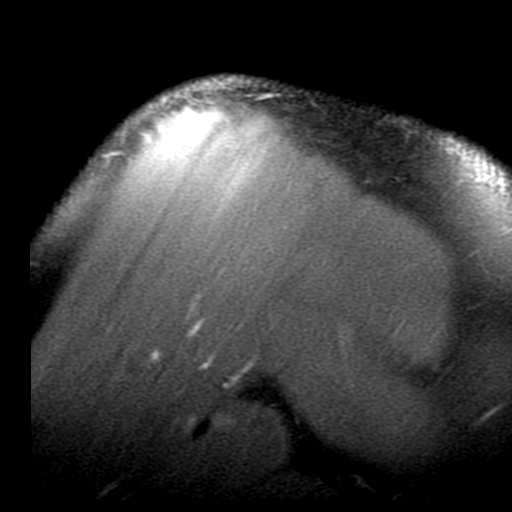

[Series 6: T2 fat-sat · oblique · 4.0mm · 0.27mm/px · 3 of 24 slices shown (2 of 2)]
[im 4/24]
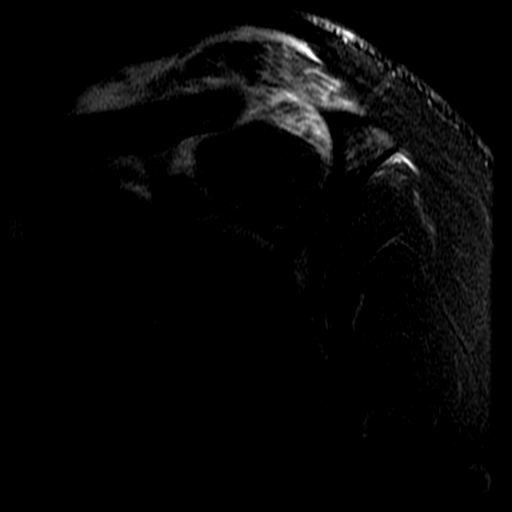
[im 14/24]
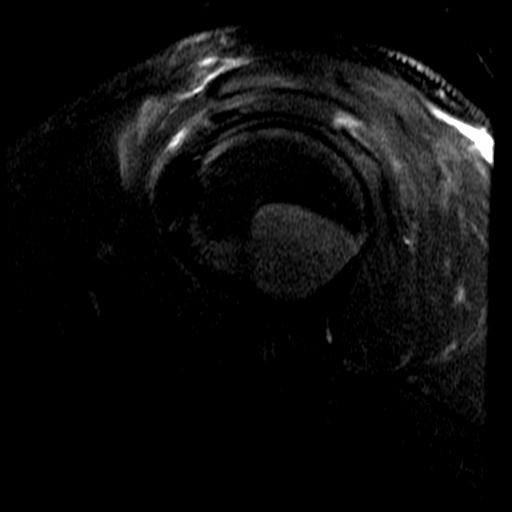
[im 20/24]
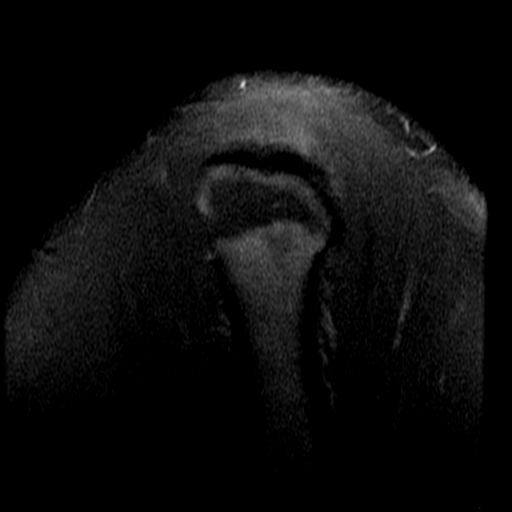

[19 of 40 positions shown; findings below may reference images not displayed]

FINDINGS: Rotator cuff:  Intact rotator cuff.

Muscles: No atrophy or abnormal signal of the muscles of the rotator
cuff. Edema within the distal trapezius muscle and proximal deltoid
muscle near the clavicular attachment.

Biceps long head:  Intact and normally positioned.

Acromioclavicular Joint: Small amount of fluid within the
acromioclavicular joint with prominent periarticular soft tissue
inflammatory changes. Type I acromion. Trace fluid in the
subacromial/subdeltoid bursa.

Glenohumeral Joint: No joint effusion. No chondral defect.

Labrum: Grossly intact, but evaluation is limited by lack of
intraarticular fluid.

Bones:  No marrow abnormality, fracture or dislocation.

Other: No fluid collection.
IMPRESSION: 1. Findings concerning for acromioclavicular joint septic arthritis
with small joint effusion and prominent periarticular soft tissue
inflammatory changes involving the distal trapezius muscle and
proximal deltoid muscle near the clavicular attachment.
2. No evidence of osteomyelitis.  No abscess.
3. Trace subacromial/subdeltoid bursal fluid is likely reactive.
4. No glenohumeral joint effusion.

## 2019-11-18 ENCOUNTER — Emergency Department (HOSPITAL_COMMUNITY)
Admission: EM | Admit: 2019-11-18 | Discharge: 2019-11-18 | Disposition: A | Discharge status: Home / Self Care | Payer: Self-pay | Attending: Emergency Medicine | Admitting: Emergency Medicine

## 2019-11-18 ENCOUNTER — Other Ambulatory Visit: Payer: Self-pay

## 2019-11-18 DIAGNOSIS — Z760 Encounter for issue of repeat prescription: Secondary | ICD-10-CM | POA: Insufficient documentation

## 2019-11-18 DIAGNOSIS — Z79899 Other long term (current) drug therapy: Secondary | ICD-10-CM | POA: Insufficient documentation

## 2019-11-18 DIAGNOSIS — J45909 Unspecified asthma, uncomplicated: Secondary | ICD-10-CM | POA: Insufficient documentation

## 2019-11-18 DIAGNOSIS — F17210 Nicotine dependence, cigarettes, uncomplicated: Secondary | ICD-10-CM | POA: Insufficient documentation

## 2019-11-18 MED ORDER — ALBUTEROL SULFATE HFA 108 (90 BASE) MCG/ACT IN AERS
INHALATION_SPRAY | RESPIRATORY_TRACT | Status: AC
  Filled 2019-11-18: qty 6.7

## 2019-11-18 MED ORDER — ALBUTEROL SULFATE HFA 108 (90 BASE) MCG/ACT IN AERS: 2.0000 | INHALATION_SPRAY | RESPIRATORY_TRACT | 1 refills | Status: AC | PRN

## 2019-11-18 MED ORDER — ALBUTEROL SULFATE HFA 108 (90 BASE) MCG/ACT IN AERS
1.0000 | INHALATION_SPRAY | Freq: Once | RESPIRATORY_TRACT | Status: AC
  Administered 2019-11-18: 1 via RESPIRATORY_TRACT

## 2019-11-18 NOTE — Discharge Instructions (Signed)
Please read and follow all provided instructions.  Your diagnoses today include:  1. Encounter for medication refill     Tests performed today include:  Vital signs. See below for your results today.   Medications prescribed:  Albuterol inhaler - medication that opens up your airway   Use inhaler as follows: 1-2 puffs every 4 hours as needed for wheezing, cough, or shortness of breath.   Home care instructions:  Follow any educational materials contained in this packet.  Follow-up instructions: Please follow-up with your primary care provider as needed for further evaluation of your symptoms.  Return instructions:   Please return to the Emergency Department if you experience worsening symptoms.   Please return if you have any other emergent concerns.  Additional Information:  Your vital signs today were: BP (!) 159/94 (BP Location: Left Arm)   Pulse 86   Temp 98 F (36.7 C) (Oral)   Resp 15   Ht 6\' 4"  (1.93 m)   Wt 107.5 kg   SpO2 98%   BMI 28.85 kg/m  If your blood pressure (BP) was elevated above 135/85 this visit, please have this repeated by your doctor within one month. ---------------

## 2019-11-18 NOTE — ED Provider Notes (Signed)
[facility]-EMERGENCY DEPT Provider Note   CSN: [id number] Arrival date & time: 11/18/19  1240     History Chief Complaint  Patient presents with  . Medication Refill    Logan Hancock is a 29 y.o. male.  Patient presents the emergency department requesting prescription for albuterol inhaler.  Patient states that he is going to [facility] for treatment.  He states that prior to admission there, he needed a prescription for rescue inhaler.  Currently he has no breathing complaints.  No active wheezing, cough, fever.        Past Medical History:  Diagnosis Date  . Asthma   . Bipolar 1 disorder (HCC)   . Drug addiction (HCC)   . MRSA (methicillin resistant staph aureus) culture positive     Patient Active Problem List   Diagnosis Date Noted  . Septic arthritis of shoulder, right (HCC) 12/28/2017  . Hyponatremia 12/28/2017  . Infection of shoulder (HCC) 12/28/2017  . Sepsis (HCC) 12/27/2017  . Severe recurrent major depression without psychotic features (HCC) 12/03/2016  . Heroin use disorder, severe (HCC) 12/03/2016  . Substance induced mood disorder (HCC) 10/14/2016  . Polysubstance abuse (HCC) 10/14/2016  . Suicidal ideation     Past Surgical History:  Procedure Laterality Date  . SHOULDER ARTHROSCOPY Right 12/28/2017   Procedure: ARTHROSCOPY SHOULDER  incision and drainage right shoulder and open incision and drainage shoulder joint;  Surgeon: Beverely Low, MD;  Location: [facility] ORS;  Service: Orthopedics;  Laterality: Right;  . TEE WITHOUT CARDIOVERSION N/A 01/09/2018   Procedure: TRANSESOPHAGEAL ECHOCARDIOGRAM (TEE);  Surgeon: Jake Bathe, MD;  Location: [facility] ENDOSCOPY;  Service: Cardiovascular;  Laterality: N/A;  with anesthesia       No family history on file.  Social History   Tobacco Use  . Smoking status: Current Every Day Smoker    Packs/day: 1.00    Years: 2.00    Pack years: 2.00    Types: Cigarettes  . Smokeless tobacco: Never  Used  Vaping Use  . Vaping Use: Never assessed  Substance Use Topics  . Alcohol use: No  . Drug use: Yes    Types: IV    Home Medications Prior to Admission medications   Medication Sig Start Date End Date Taking? Authorizing Provider  acetaminophen (TYLENOL) 325 MG tablet Take 2 tablets (650 mg total) by mouth every 6 (six) hours as needed for moderate pain. 02/16/18   Mesner, Barbara Cower, MD  albuterol (VENTOLIN HFA) 108 (90 Base) MCG/ACT inhaler Inhale 2 puffs into the lungs every 4 (four) hours as needed for wheezing or shortness of breath. 11/18/19   Renne Crigler, PA-C  buPROPion (WELLBUTRIN XL) 300 MG 24 hr tablet Take 1 tablet (300 mg total) by mouth daily. For depression 01/30/18   Armandina Stammer I, NP  gabapentin (NEURONTIN) 400 MG capsule Take 1 capsule (400 mg total) by mouth 3 (three) times daily. For agitation 02/16/18   Mesner, Barbara Cower, MD  hydrOXYzine (ATARAX/VISTARIL) 25 MG tablet Take 1 tablet (25 mg total) by mouth every 6 (six) hours as needed. 02/16/18   Mesner, Barbara Cower, MD  ibuprofen (ADVIL,MOTRIN) 400 MG tablet Take 1 tablet (400 mg total) by mouth every 6 (six) hours as needed for moderate pain. 01/29/18   Armandina Stammer I, NP  mirtazapine (REMERON) 30 MG tablet Take 1 tablet (30 mg total) by mouth at bedtime. For depression 01/29/18   Armandina Stammer I, NP  pantoprazole (PROTONIX) 40 MG tablet Take 1 tablet (40 mg total)  by mouth daily. For acid reflux 01/30/18   Armandina Stammer I, NP  QUEtiapine (SEROQUEL) 200 MG tablet Take 1 tablet (200 mg total) by mouth at bedtime. For mood control 01/29/18   Armandina Stammer I, NP  traZODone (DESYREL) 100 MG tablet Take 1 tablet (100 mg total) by mouth at bedtime as needed for sleep. 01/29/18   Sanjuana Kava, NP    Allergies    Patient has no known allergies.  Review of Systems   Review of Systems  Constitutional: Negative for fever.  Respiratory: Negative for cough, chest tightness, shortness of breath and wheezing.     Physical Exam Updated Vital  Signs BP (!) 159/94 (BP Location: Left Arm)   Pulse 86   Temp 98 F (36.7 C) (Oral)   Resp 15   Ht 6\' 4"  (1.93 m)   Wt 107.5 kg   SpO2 98%   BMI 28.85 kg/m   Physical Exam Vitals and nursing note reviewed.  Constitutional:      Appearance: He is well-developed.  HENT:     Head: Normocephalic and atraumatic.  Eyes:     Conjunctiva/sclera: Conjunctivae normal.  Pulmonary:     Effort: No respiratory distress.  Musculoskeletal:     Cervical back: Normal range of motion and neck supple.  Skin:    General: Skin is warm and dry.  Neurological:     Mental Status: He is alert.     ED Results / Procedures / Treatments   Labs (all labs ordered are listed, but only abnormal results are displayed) Labs Reviewed - No data to display  EKG None  Radiology No results found.  Procedures Procedures (including critical care time)  Medications Ordered in ED Medications - No data to display  ED Course  I have reviewed the triage vital signs and the nursing notes.  Pertinent labs & imaging results that were available during my care of the patient were reviewed by me and considered in my medical decision making (see chart for details).  Patient seen and examined.  Unremarkable exam.  Patient provided a prescription for albuterol inhaler with 1 refill.  Plan for discharge.  Vital signs reviewed and are as follows: BP (!) 159/94 (BP Location: Left Arm)   Pulse 86   Temp 98 F (36.7 C) (Oral)   Resp 15   Ht 6\' 4"  (1.93 m)   Wt 107.5 kg   SpO2 98%   BMI 28.85 kg/m       MDM Rules/Calculators/A&P                          Here for albuterol inhaler prescription, no complaints.   Final Clinical Impression(s) / ED Diagnoses Final diagnoses:  Encounter for medication refill    Rx / DC Orders ED Discharge Orders         Ordered    albuterol (VENTOLIN HFA) 108 (90 Base) MCG/ACT inhaler  Every 4 hours PRN        11/18/19 1316           ,  PA-C 11/18/19 1320    Renne Crigler, MD 11/20/19 [phone]

## 2019-11-18 NOTE — ED Triage Notes (Signed)
Patient reports he needs a prescription for albuterol so he can go to rehab for heroin and meth use
